# Patient Record
Sex: Female | Born: 1995 | Race: White | Hispanic: No | Marital: Single | Smoking: Former smoker
Health system: Southern US, Community
[De-identification: ages and names within clinical notes are randomized; demographics above are authoritative.]

## PROBLEM LIST (undated history)

## (undated) ENCOUNTER — Emergency Department (HOSPITAL_COMMUNITY): Admission: EM | Payer: No Typology Code available for payment source

## (undated) ENCOUNTER — Emergency Department (HOSPITAL_COMMUNITY): Payer: No Typology Code available for payment source

## (undated) DIAGNOSIS — Z789 Other specified health status: Secondary | ICD-10-CM

## (undated) DIAGNOSIS — F419 Anxiety disorder, unspecified: Secondary | ICD-10-CM

## (undated) DIAGNOSIS — F909 Attention-deficit hyperactivity disorder, unspecified type: Secondary | ICD-10-CM

## (undated) DIAGNOSIS — F329 Major depressive disorder, single episode, unspecified: Secondary | ICD-10-CM

## (undated) DIAGNOSIS — F319 Bipolar disorder, unspecified: Secondary | ICD-10-CM

## (undated) DIAGNOSIS — Z0282 Encounter for adoption services: Secondary | ICD-10-CM

## (undated) DIAGNOSIS — F32A Depression, unspecified: Secondary | ICD-10-CM

## (undated) HISTORY — PX: SMALL INTESTINE SURGERY: SHX150

## (undated) HISTORY — DX: Anxiety disorder, unspecified: F41.9

## (undated) HISTORY — DX: Other specified health status: Z78.9

## (undated) HISTORY — PX: TONSILLECTOMY: SUR1361

## (undated) HISTORY — DX: Encounter for adoption services: Z02.82

---
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
## 2013-02-26 ENCOUNTER — Encounter (HOSPITAL_BASED_OUTPATIENT_CLINIC_OR_DEPARTMENT_OTHER): Payer: Self-pay | Admitting: Emergency Medicine

## 2013-02-26 ENCOUNTER — Emergency Department (HOSPITAL_BASED_OUTPATIENT_CLINIC_OR_DEPARTMENT_OTHER)
Admission: EM | Admit: 2013-02-26 | Discharge: 2013-02-26 | Disposition: A | Discharge status: Home / Self Care | Payer: Medicaid Other | Attending: Emergency Medicine | Admitting: Emergency Medicine

## 2013-02-26 DIAGNOSIS — Z3202 Encounter for pregnancy test, result negative: Secondary | ICD-10-CM | POA: Insufficient documentation

## 2013-02-26 DIAGNOSIS — Z880 Allergy status to penicillin: Secondary | ICD-10-CM | POA: Insufficient documentation

## 2013-02-26 DIAGNOSIS — Z792 Long term (current) use of antibiotics: Secondary | ICD-10-CM | POA: Insufficient documentation

## 2013-02-26 DIAGNOSIS — R197 Diarrhea, unspecified: Secondary | ICD-10-CM

## 2013-02-26 DIAGNOSIS — R1012 Left upper quadrant pain: Secondary | ICD-10-CM | POA: Insufficient documentation

## 2013-02-26 DIAGNOSIS — F319 Bipolar disorder, unspecified: Secondary | ICD-10-CM | POA: Insufficient documentation

## 2013-02-26 DIAGNOSIS — Z79899 Other long term (current) drug therapy: Secondary | ICD-10-CM | POA: Insufficient documentation

## 2013-02-26 LAB — URINALYSIS, ROUTINE W REFLEX MICROSCOPIC
Bilirubin Urine: NEGATIVE
GLUCOSE, UA: NEGATIVE mg/dL
KETONES UR: NEGATIVE mg/dL
Nitrite: NEGATIVE
Protein, ur: NEGATIVE mg/dL
Specific Gravity, Urine: 1.024 (ref 1.005–1.030)
Urobilinogen, UA: 1 mg/dL (ref 0.0–1.0)
pH: 6 (ref 5.0–8.0)

## 2013-02-26 LAB — LIPASE, BLOOD: Lipase: 41 U/L (ref 11–59)

## 2013-02-26 LAB — COMPREHENSIVE METABOLIC PANEL
ALBUMIN: 3.2 g/dL — AB (ref 3.5–5.2)
ALT: 21 U/L (ref 0–35)
AST: 18 U/L (ref 0–37)
Alkaline Phosphatase: 81 U/L (ref 39–117)
BUN: 12 mg/dL (ref 6–23)
CHLORIDE: 102 meq/L (ref 96–112)
CO2: 24 meq/L (ref 19–32)
Calcium: 9.6 mg/dL (ref 8.4–10.5)
Creatinine, Ser: 0.5 mg/dL (ref 0.50–1.10)
GFR calc Af Amer: >90 mL/min (ref >90)
GFR calc non Af Amer: >90 mL/min (ref >90)
Glucose, Bld: 139 mg/dL — ABNORMAL HIGH (ref 70–99)
Potassium: 4.5 mEq/L (ref 3.7–5.3)
SODIUM: 141 meq/L (ref 137–147)
Total Protein: 7.4 g/dL (ref 6.0–8.3)

## 2013-02-26 LAB — URINE MICROSCOPIC-ADD ON

## 2013-02-26 LAB — CBC WITH DIFFERENTIAL/PLATELET
BASOS ABS: 0.1 10*3/uL (ref 0.0–0.1)
BASOS PCT: 1 % (ref 0–1)
Eosinophils Absolute: 0.2 10*3/uL (ref 0.0–0.7)
Eosinophils Relative: 2 % (ref 0–5)
HEMATOCRIT: 38.4 % (ref 36.0–46.0)
Hemoglobin: 12.6 g/dL (ref 12.0–15.0)
LYMPHS PCT: 47 % — AB (ref 12–46)
Lymphs Abs: 3.6 10*3/uL (ref 0.7–4.0)
MCH: 26.9 pg (ref 26.0–34.0)
MCHC: 32.8 g/dL (ref 30.0–36.0)
MCV: 82.1 fL (ref 78.0–100.0)
MONO ABS: 0.8 10*3/uL (ref 0.1–1.0)
Monocytes Relative: 11 % (ref 3–12)
NEUTROS PCT: 40 % — AB (ref 43–77)
Neutro Abs: 3 10*3/uL (ref 1.7–7.7)
Platelets: 381 10*3/uL (ref 150–400)
RBC: 4.68 MIL/uL (ref 3.87–5.11)
RDW: 12.5 % (ref 11.5–15.5)
WBC: 7.6 10*3/uL (ref 4.0–10.5)

## 2013-02-26 LAB — PREGNANCY, URINE: Preg Test, Ur: NEGATIVE

## 2013-02-26 NOTE — Discharge Instructions (Signed)
Imodium over-the-counter as needed.  Return to the emergency department if you develop severe abdominal pain, bloody stool, and followup with your Dr. if not improving in the next week.   Diarrhea Diarrhea is frequent loose and watery bowel movements. It can cause you to feel weak and dehydrated. Dehydration can cause you to become tired and thirsty, have a dry mouth, and have decreased urination that often is dark yellow. Diarrhea is a sign of another problem, most often an infection that will not last long. In most cases, diarrhea typically lasts 2 3 days. However, it can last longer if it is a sign of something more serious. It is important to treat your diarrhea as directed by your caregive to lessen or prevent future episodes of diarrhea. CAUSES  Some common causes include:  Gastrointestinal infections caused by viruses, bacteria, or parasites.  Food poisoning or food allergies.  Certain medicines, such as antibiotics, chemotherapy, and laxatives.  Artificial sweeteners and fructose.  Digestive disorders. HOME CARE INSTRUCTIONS  Ensure adequate fluid intake (hydration): have 1 cup (8 oz) of fluid for each diarrhea episode. Avoid fluids that contain simple sugars or sports drinks, fruit juices, whole milk products, and sodas. Your urine should be clear or pale yellow if you are drinking enough fluids. Hydrate with an oral rehydration solution that you can purchase at pharmacies, retail stores, and online. You can prepare an oral rehydration solution at home by mixing the following ingredients together:    tsp table salt.   tsp baking soda.   tsp salt substitute containing potassium chloride.  1  tablespoons sugar.  1 L (34 oz) of water.  Certain foods and beverages may increase the speed at which food moves through the gastrointestinal (GI) tract. These foods and beverages should be avoided and include:  Caffeinated and alcoholic beverages.  High-fiber foods, such as raw  fruits and vegetables, nuts, seeds, and whole grain breads and cereals.  Foods and beverages sweetened with sugar alcohols, such as xylitol, sorbitol, and mannitol.  Some foods may be well tolerated and may help thicken stool including:  Starchy foods, such as rice, toast, pasta, low-sugar cereal, oatmeal, grits, baked potatoes, crackers, and bagels.  Bananas.  Applesauce.  Add probiotic-rich foods to help increase healthy bacteria in the GI tract, such as yogurt and fermented milk products.  Wash your hands well after each diarrhea episode.  Only take over-the-counter or prescription medicines as directed by your caregiver.  Take a warm bath to relieve any burning or pain from frequent diarrhea episodes. SEEK IMMEDIATE MEDICAL CARE IF:   You are unable to keep fluids down.  You have persistent vomiting.  You have blood in your stool, or your stools are black and tarry.  You do not urinate in 6 8 hours, or there is only a small amount of very dark urine.  You have abdominal pain that increases or localizes.  You have weakness, dizziness, confusion, or lightheadedness.  You have a severe headache.  Your diarrhea gets worse or does not get better.  You have a fever or persistent symptoms for more than 2 3 days.  You have a fever and your symptoms suddenly get worse. MAKE SURE YOU:   Understand these instructions.  Will watch your condition.  Will get help right away if you are not doing well or get worse. Document Released: 12/15/2001 Document Revised: 12/12/2011 Document Reviewed: 09/02/2011 [facility] Patient Information 2014 [employer], [place].

## 2013-02-26 NOTE — ED Provider Notes (Signed)
CSN: [id number]     Arrival date & time 02/26/13  1449 History   First MD Initiated Contact with Patient 02/26/13 1536     Chief Complaint  Patient presents with  . Abdominal Pain     (Consider location/radiation/quality/duration/timing/severity/associated sxs/prior Treatment) HPI Comments: Patient is a 18 year old female with history of bipolar disorder. She presents today with complaints of diarrhea and abdominal cramping that has been occurring each time she eats for the past 2 weeks. She denies any bloody stool, nausea, or vomiting. She denies any fevers or chills. She denies any history of abdominal surgeries. Her last menstrual period was last month and was normal. She denies any dysuria.  Patient is a 18 y.o. female presenting with abdominal pain. The history is provided by the patient.  Abdominal Pain Pain location:  Epigastric and LUQ Pain quality: cramping   Pain radiates to:  Does not radiate Pain severity:  Mild Onset quality:  Gradual Duration:  2 weeks Timing:  Intermittent Progression:  Worsening Chronicity:  New Context: eating   Relieved by:  Nothing   Past Medical History  Diagnosis Date  . Bipolar disorder   . ADHD (attention deficit hyperactivity disorder)    Past Surgical History  Procedure Laterality Date  . Tonsillectomy     No family history on file. History  Substance Use Topics  . Smoking status: Never Smoker   . Smokeless tobacco: Not on file  . Alcohol Use: No   OB History   Grav Para Term Preterm Abortions TAB SAB Ect Mult Living                 Review of Systems  Gastrointestinal: Positive for abdominal pain.  All other systems reviewed and are negative.      Allergies  Penicillins  Home Medications   Current Outpatient Rx  Name  Route  Sig  Dispense  Refill  . divalproex (DEPAKOTE ER) 250 MG 24 hr tablet   Oral   Take 750 mg by mouth at bedtime.         Marland Kitchen doxycycline (VIBRAMYCIN) 100 MG capsule   Oral   Take 1  capsule (100 mg total) by mouth 2 (two) times daily.   20 capsule   0   . ibuprofen (ADVIL,MOTRIN) 200 MG tablet   Oral   Take 400 mg by mouth every 6 (six) hours as needed. For pain         . norgestimate-ethinyl estradiol (SPRINTEC 28) 0.25-35 MG-MCG tablet   Oral   Take 1 tablet by mouth daily.           . QUEtiapine (SEROQUEL) 200 MG tablet   Oral   Take 200 mg by mouth at bedtime.         . sertraline (ZOLOFT) 100 MG tablet   Oral   Take 100 mg by mouth daily.            BP 142/87  Pulse 60  Temp(Src) 98.7 F (37.1 C) (Oral)  Resp 16  Ht 5\' 4"  (1.626 m)  Wt 210 lb (95.255 kg)  BMI 36.03 kg/m2  SpO2 100%  LMP 02/17/2013 Physical Exam  Nursing note and vitals reviewed. Constitutional: She is oriented to person, place, and time. She appears well-developed and well-nourished. No distress.  HENT:  Head: Normocephalic and atraumatic.  Neck: Normal range of motion. Neck supple.  Cardiovascular: Normal rate and regular rhythm.  Exam reveals no gallop and no friction rub.   No murmur heard.  Pulmonary/Chest: Effort normal and breath sounds normal. No respiratory distress. She has no wheezes.  Abdominal: Soft. Bowel sounds are normal. She exhibits no distension and no mass. There is tenderness. There is no rebound and no guarding.  There is mild tenderness to palpation in the epigastric region and left upper quadrant.  Musculoskeletal: Normal range of motion.  Neurological: She is alert and oriented to person, place, and time.  Skin: Skin is warm and dry. She is not diaphoretic.    ED Course  Procedures (including critical care time) Labs Review Labs Reviewed  CBC WITH DIFFERENTIAL  COMPREHENSIVE METABOLIC PANEL  LIPASE, BLOOD  URINALYSIS, ROUTINE W REFLEX MICROSCOPIC  PREGNANCY, URINE   Imaging Review No results found.    MDM   Final diagnoses:  None    Patient is a 18 year old female who presents with diarrhea after eating for the past 2 weeks.  She is having no significant abdominal pain and her abdominal exam is benign. She has no elevated white count and her LFTs and lipase are normal. I doubt this is a gallbladder issue a gallbladder issue. I suspect this may be viral in nature and will recommend continued patience and time. She can also take some Imodium. She is to return if her symptoms worsen or change.    Geoffery Lyonsouglas Maxwel Meadowcroft, MD 02/26/13 [phone]

## 2013-02-26 NOTE — ED Notes (Signed)
C/o abd pain and diarrhea after eating x 2 weeks

## 2013-02-26 NOTE — ED Notes (Signed)
MD at bedside. 

## 2013-03-12 ENCOUNTER — Emergency Department (HOSPITAL_BASED_OUTPATIENT_CLINIC_OR_DEPARTMENT_OTHER)
Admission: EM | Admit: 2013-03-12 | Discharge: 2013-03-12 | Disposition: A | Discharge status: Home / Self Care | Payer: Medicaid Other | Attending: Emergency Medicine | Admitting: Emergency Medicine

## 2013-03-12 ENCOUNTER — Emergency Department (HOSPITAL_BASED_OUTPATIENT_CLINIC_OR_DEPARTMENT_OTHER): Payer: Medicaid Other

## 2013-03-12 ENCOUNTER — Encounter (HOSPITAL_BASED_OUTPATIENT_CLINIC_OR_DEPARTMENT_OTHER): Payer: Self-pay | Admitting: Emergency Medicine

## 2013-03-12 DIAGNOSIS — Z792 Long term (current) use of antibiotics: Secondary | ICD-10-CM | POA: Insufficient documentation

## 2013-03-12 DIAGNOSIS — Z79899 Other long term (current) drug therapy: Secondary | ICD-10-CM | POA: Insufficient documentation

## 2013-03-12 DIAGNOSIS — S46909A Unspecified injury of unspecified muscle, fascia and tendon at shoulder and upper arm level, unspecified arm, initial encounter: Secondary | ICD-10-CM | POA: Insufficient documentation

## 2013-03-12 DIAGNOSIS — Z880 Allergy status to penicillin: Secondary | ICD-10-CM | POA: Insufficient documentation

## 2013-03-12 DIAGNOSIS — F319 Bipolar disorder, unspecified: Secondary | ICD-10-CM | POA: Insufficient documentation

## 2013-03-12 DIAGNOSIS — S40019A Contusion of unspecified shoulder, initial encounter: Secondary | ICD-10-CM | POA: Insufficient documentation

## 2013-03-12 DIAGNOSIS — Y9389 Activity, other specified: Secondary | ICD-10-CM | POA: Insufficient documentation

## 2013-03-12 DIAGNOSIS — S161XXA Strain of muscle, fascia and tendon at neck level, initial encounter: Secondary | ICD-10-CM

## 2013-03-12 DIAGNOSIS — Y92410 Unspecified street and highway as the place of occurrence of the external cause: Secondary | ICD-10-CM | POA: Insufficient documentation

## 2013-03-12 DIAGNOSIS — S139XXA Sprain of joints and ligaments of unspecified parts of neck, initial encounter: Secondary | ICD-10-CM | POA: Insufficient documentation

## 2013-03-12 DIAGNOSIS — Z3202 Encounter for pregnancy test, result negative: Secondary | ICD-10-CM | POA: Insufficient documentation

## 2013-03-12 DIAGNOSIS — S0990XA Unspecified injury of head, initial encounter: Secondary | ICD-10-CM | POA: Insufficient documentation

## 2013-03-12 DIAGNOSIS — S4980XA Other specified injuries of shoulder and upper arm, unspecified arm, initial encounter: Secondary | ICD-10-CM | POA: Insufficient documentation

## 2013-03-12 LAB — URINALYSIS, ROUTINE W REFLEX MICROSCOPIC
Bilirubin Urine: NEGATIVE
GLUCOSE, UA: NEGATIVE mg/dL
Ketones, ur: NEGATIVE mg/dL
Nitrite: NEGATIVE
PROTEIN: NEGATIVE mg/dL
Specific Gravity, Urine: 1.024 (ref 1.005–1.030)
Urobilinogen, UA: 1 mg/dL (ref 0.0–1.0)
pH: 7.5 (ref 5.0–8.0)

## 2013-03-12 LAB — URINE MICROSCOPIC-ADD ON

## 2013-03-12 LAB — PREGNANCY, URINE: Preg Test, Ur: NEGATIVE

## 2013-03-12 MED ORDER — IBUPROFEN 800 MG PO TABS
800.0000 mg | ORAL_TABLET | Freq: Once | ORAL | Status: AC
  Administered 2013-03-12: 800 mg via ORAL
  Filled 2013-03-12: qty 1

## 2013-03-12 MED ORDER — OXYCODONE-ACETAMINOPHEN 5-325 MG PO TABS: 1.0000 | ORAL_TABLET | Freq: Four times a day (QID) | ORAL | Status: DC | PRN

## 2013-03-12 MED ORDER — OXYCODONE-ACETAMINOPHEN 5-325 MG PO TABS
2.0000 | ORAL_TABLET | Freq: Once | ORAL | Status: AC
  Administered 2013-03-12: 2 via ORAL
  Filled 2013-03-12: qty 2

## 2013-03-12 MED ORDER — IBUPROFEN 800 MG PO TABS
ORAL_TABLET | ORAL | Status: AC
Start: 2013-03-12 — End: 2013-03-12
  Administered 2013-03-12: 800 mg via ORAL
  Filled 2013-03-12: qty 1
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
## 2013-03-12 NOTE — Discharge Instructions (Signed)
Take motrin 600 mg every 6 hrs for pain.   Take percocet for severe pain.   Follow up with your doctor.   Return to ER if you have severe pain, vomiting.
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
## 2013-05-07 ENCOUNTER — Emergency Department (HOSPITAL_BASED_OUTPATIENT_CLINIC_OR_DEPARTMENT_OTHER)
Admission: EM | Admit: 2013-05-07 | Discharge: 2013-05-07 | Disposition: A | Discharge status: Home / Self Care | Payer: Medicaid Other | Attending: Emergency Medicine | Admitting: Emergency Medicine

## 2013-05-07 ENCOUNTER — Encounter (HOSPITAL_BASED_OUTPATIENT_CLINIC_OR_DEPARTMENT_OTHER): Payer: Self-pay | Admitting: Emergency Medicine

## 2013-05-07 DIAGNOSIS — Z79899 Other long term (current) drug therapy: Secondary | ICD-10-CM | POA: Insufficient documentation

## 2013-05-07 DIAGNOSIS — Z792 Long term (current) use of antibiotics: Secondary | ICD-10-CM | POA: Insufficient documentation

## 2013-05-07 DIAGNOSIS — F319 Bipolar disorder, unspecified: Secondary | ICD-10-CM | POA: Insufficient documentation

## 2013-05-07 DIAGNOSIS — G43909 Migraine, unspecified, not intractable, without status migrainosus: Secondary | ICD-10-CM | POA: Insufficient documentation

## 2013-05-07 DIAGNOSIS — Z880 Allergy status to penicillin: Secondary | ICD-10-CM | POA: Insufficient documentation

## 2013-05-07 MED ORDER — DIPHENHYDRAMINE HCL 50 MG/ML IJ SOLN
25.0000 mg | Freq: Once | INTRAMUSCULAR | Status: AC
  Administered 2013-05-07: 25 mg via INTRAVENOUS
  Filled 2013-05-07: qty 1

## 2013-05-07 MED ORDER — METHYLPREDNISOLONE SODIUM SUCC 125 MG IJ SOLR
125.0000 mg | Freq: Once | INTRAMUSCULAR | Status: AC
  Administered 2013-05-07: 125 mg via INTRAVENOUS
  Filled 2013-05-07: qty 2

## 2013-05-07 MED ORDER — METOCLOPRAMIDE HCL 5 MG/ML IJ SOLN
10.0000 mg | Freq: Once | INTRAMUSCULAR | Status: AC
  Administered 2013-05-07: 10 mg via INTRAVENOUS
  Filled 2013-05-07: qty 2

## 2013-05-07 MED ORDER — KETOROLAC TROMETHAMINE 30 MG/ML IJ SOLN
30.0000 mg | Freq: Once | INTRAMUSCULAR | Status: AC
  Administered 2013-05-07: 30 mg via INTRAVENOUS
  Filled 2013-05-07: qty 1

## 2013-05-07 MED ORDER — SODIUM CHLORIDE 0.9 % IV BOLUS (SEPSIS)
1000.0000 mL | Freq: Once | INTRAVENOUS | Status: AC
  Administered 2013-05-07: 1000 mL via INTRAVENOUS

## 2013-05-07 NOTE — Discharge Instructions (Signed)
Migraine Headache A migraine headache is an intense, throbbing pain on one or both sides of your head. A migraine can last for 30 minutes to several hours. CAUSES  The exact cause of a migraine headache is not always known. However, a migraine may be caused when nerves in the brain become irritated and release chemicals that cause inflammation. This causes pain. Certain things may also trigger migraines, such as:  Alcohol.  Smoking.  Stress.  Menstruation.  Aged cheeses.  Foods or drinks that contain nitrates, glutamate, aspartame, or tyramine.  Lack of sleep.  Chocolate.  Caffeine.  Hunger.  Physical exertion.  Fatigue.  Medicines used to treat chest pain (nitroglycerine), birth control pills, estrogen, and some blood pressure medicines. SIGNS AND SYMPTOMS  Pain on one or both sides of your head.  Pulsating or throbbing pain.  Severe pain that prevents daily activities.  Pain that is aggravated by any physical activity.  Nausea, vomiting, or both.  Dizziness.  Pain with exposure to bright lights, loud noises, or activity.  General sensitivity to bright lights, loud noises, or smells. Before you get a migraine, you may get warning signs that a migraine is coming (aura). An aura may include:  Seeing flashing lights.  Seeing bright spots, halos, or zig-zag lines.  Having tunnel vision or blurred vision.  Having feelings of numbness or tingling.  Having trouble talking.  Having muscle weakness. DIAGNOSIS  A migraine headache is often diagnosed based on:  Symptoms.  Physical exam.  A CT scan or MRI of your head. These imaging tests cannot diagnose migraines, but they can help rule out other causes of headaches. TREATMENT Medicines may be given for pain and nausea. Medicines can also be given to help prevent recurrent migraines.  HOME CARE INSTRUCTIONS  Only take over-the-counter or prescription medicines for pain or discomfort as directed by your  health care provider. The use of long-term narcotics is not recommended.  Lie down in a dark, quiet room when you have a migraine.  Keep a journal to find out what may trigger your migraine headaches. For example, write down:  What you eat and drink.  How much sleep you get.  Any change to your diet or medicines.  Limit alcohol consumption.  Quit smoking if you smoke.  Get 7 9 hours of sleep, or as recommended by your health care provider.  Limit stress.  Keep lights dim if bright lights bother you and make your migraines worse. SEEK IMMEDIATE MEDICAL CARE IF:   Your migraine becomes severe.  You have a fever.  You have a stiff neck.  You have vision loss.  You have muscular weakness or loss of muscle control.  You start losing your balance or have trouble walking.  You feel faint or pass out.  You have severe symptoms that are different from your first symptoms. MAKE SURE YOU:   Understand these instructions.  Will watch your condition.  Will get help right away if you are not doing well or get worse. Document Released: 12/25/2004 Document Revised: 10/15/2012 Document Reviewed: 09/01/2012 [employer] Patient Information 2014 [employer].  

## 2013-05-07 NOTE — ED Notes (Signed)
Severe migraine since 4:30 pm.

## 2013-05-07 NOTE — ED Provider Notes (Signed)
CSN: [id number]     Arrival date & time 05/07/13  2126 History  This chart was scribed for Geoffery Lyonsouglas Jema Deegan, MD by Joaquin MusicKristina Sanchez-Matthews, ED Scribe. This patient was seen in room MH02/MH02 and the patient's care was started at 9:49 PM.   Chief Complaint  Patient presents with  . Migraine   Patient is a 18 y.o. female presenting with migraines. The history is provided by the patient. No language interpreter was used.  Migraine This is a recurrent problem. The current episode started 3 to 5 hours ago. The problem occurs constantly. Associated symptoms include headaches. Nothing aggravates the symptoms. Nothing relieves the symptoms. She has tried food, rest and acetaminophen for the symptoms. The treatment provided no relief.   HPI Comments: Jordan Hanson is a 18 y.o. female with a hx of migraines who presents to the Emergency Department complaining of severe sharp migraine with associated photophobia that began 5 hours ago today. Pt states she has had HA;s in the past but never this bad. She states she is having a pressure. The pain is on the top of her head and intermittently shooting pain. She reports eating, staying hydrated, taking OTC ibuprofen and Advil without any relief. Denies taking medications for migraines or being seen by a specialist. Denies fever, congestion, blurred vision, visual differences, drinking caffeine or energy drinks and recent traumas/injuries to head.   Past Medical History  Diagnosis Date  . Bipolar disorder   . ADHD (attention deficit hyperactivity disorder)    Past Surgical History  Procedure Laterality Date  . Tonsillectomy     No family history on file. History  Substance Use Topics  . Smoking status: Never Smoker   . Smokeless tobacco: Not on file  . Alcohol Use: No   OB History   Grav Para Term Preterm Abortions TAB SAB Ect Mult Living                 Review of Systems  Constitutional: Negative for fever.  HENT: Negative for congestion.   Eyes:  Positive for photophobia. Negative for visual disturbance.  Neurological: Positive for headaches. Negative for dizziness and syncope.  All other systems reviewed and are negative.  Allergies  Penicillins  Home Medications   Prior to Admission medications   Medication Sig Start Date End Date Taking? Authorizing Provider  divalproex (DEPAKOTE ER) 250 MG 24 hr tablet Take 750 mg by mouth at bedtime.    Historical Provider, MD  doxycycline (VIBRAMYCIN) 100 MG capsule Take 1 capsule (100 mg total) by mouth 2 (two) times daily. 11/11/12   Teressa LowerVrinda Pickering, NP  ibuprofen (ADVIL,MOTRIN) 200 MG tablet Take 400 mg by mouth every 6 (six) hours as needed. For pain    Historical Provider, MD  norgestimate-ethinyl estradiol (SPRINTEC 28) 0.25-35 MG-MCG tablet Take 1 tablet by mouth daily.      Historical Provider, MD  oxyCODONE-acetaminophen (PERCOCET) 5-325 MG per tablet Take 1 tablet by mouth every 6 (six) hours as needed. 03/12/13   Richardean Canalavid H Yao, MD  QUEtiapine (SEROQUEL) 200 MG tablet Take 200 mg by mouth at bedtime.    Historical Provider, MD  sertraline (ZOLOFT) 100 MG tablet Take 100 mg by mouth daily.      Historical Provider, MD   BP 148/94  Pulse 106  Temp(Src) 98.5 F (36.9 C) (Oral)  Resp 20  Ht 5\' 4"  (1.626 m)  Wt 210 lb (95.255 kg)  BMI 36.03 kg/m2  SpO2 100%  LMP 04/23/2013  Physical Exam  Nursing note and vitals reviewed. Constitutional: She is oriented to person, place, and time. She appears well-developed and well-nourished. No distress.  HENT:  Head: Normocephalic and atraumatic.  Eyes: EOM are normal. Pupils are equal, round, and reactive to light.  No papilledema.  Neck: Neck supple. No tracheal deviation present.  Cardiovascular: Normal rate.   Pulmonary/Chest: Effort normal. No respiratory distress.  Musculoskeletal: Normal range of motion.  Neurological: She is alert and oriented to person, place, and time. No cranial nerve deficit. She exhibits normal muscle tone.  Coordination normal.  Skin: Skin is warm and dry.  Psychiatric: She has a normal mood and affect. Her behavior is normal.   ED Course  Procedures DIAGNOSTIC STUDIES: Oxygen Saturation is 100% on RA, normal by my interpretation.    COORDINATION OF CARE: 9:52 PM-Discussed treatment plan which includes IV fluids and Migraine cocktail. Informed pt to F/U with PCP. Pt agreed to plan.   Labs Review Labs Reviewed - No data to display  Imaging Review No results found.   EKG Interpretation None     MDM   Final diagnoses:  None    Patient feeling better with migraine cocktail. Neurologic exam is nonfocal. I do not feel as though imaging or LP or indicated. She is to return if her symptoms worsen or change.  I personally performed the services described in this documentation, which was scribed in my presence. The recorded information has been reviewed and is accurate.      Geoffery Lyonsouglas Auri Jahnke, MD 05/07/13 2300

## 2013-05-07 NOTE — ED Notes (Signed)
C/o ha onset 430 this pm  Has taken ibu w no relief

## 2013-09-09 ENCOUNTER — Encounter (HOSPITAL_BASED_OUTPATIENT_CLINIC_OR_DEPARTMENT_OTHER): Payer: Self-pay | Admitting: Emergency Medicine

## 2013-09-09 ENCOUNTER — Emergency Department (HOSPITAL_BASED_OUTPATIENT_CLINIC_OR_DEPARTMENT_OTHER)
Admission: EM | Admit: 2013-09-09 | Discharge: 2013-09-09 | Disposition: A | Discharge status: Home / Self Care | Payer: Medicaid Other | Attending: Emergency Medicine | Admitting: Emergency Medicine

## 2013-09-09 DIAGNOSIS — N925 Other specified irregular menstruation: Secondary | ICD-10-CM | POA: Insufficient documentation

## 2013-09-09 DIAGNOSIS — N949 Unspecified condition associated with female genital organs and menstrual cycle: Secondary | ICD-10-CM | POA: Insufficient documentation

## 2013-09-09 DIAGNOSIS — N898 Other specified noninflammatory disorders of vagina: Secondary | ICD-10-CM | POA: Diagnosis present

## 2013-09-09 DIAGNOSIS — Z79899 Other long term (current) drug therapy: Secondary | ICD-10-CM | POA: Diagnosis not present

## 2013-09-09 DIAGNOSIS — Z792 Long term (current) use of antibiotics: Secondary | ICD-10-CM | POA: Diagnosis not present

## 2013-09-09 DIAGNOSIS — N938 Other specified abnormal uterine and vaginal bleeding: Secondary | ICD-10-CM | POA: Diagnosis not present

## 2013-09-09 DIAGNOSIS — Z3202 Encounter for pregnancy test, result negative: Secondary | ICD-10-CM | POA: Diagnosis not present

## 2013-09-09 DIAGNOSIS — Z880 Allergy status to penicillin: Secondary | ICD-10-CM | POA: Diagnosis not present

## 2013-09-09 DIAGNOSIS — Z791 Long term (current) use of non-steroidal anti-inflammatories (NSAID): Secondary | ICD-10-CM | POA: Insufficient documentation

## 2013-09-09 DIAGNOSIS — F319 Bipolar disorder, unspecified: Secondary | ICD-10-CM | POA: Insufficient documentation

## 2013-09-09 LAB — URINALYSIS, ROUTINE W REFLEX MICROSCOPIC
Bilirubin Urine: NEGATIVE
Glucose, UA: NEGATIVE mg/dL
Ketones, ur: 15 mg/dL — AB
Nitrite: NEGATIVE
PH: 6.5 (ref 5.0–8.0)
Protein, ur: NEGATIVE mg/dL
SPECIFIC GRAVITY, URINE: 1.024 (ref 1.005–1.030)
Urobilinogen, UA: 1 mg/dL (ref 0.0–1.0)

## 2013-09-09 LAB — WET PREP, GENITAL
TRICH WET PREP: NONE SEEN
Yeast Wet Prep HPF POC: NONE SEEN

## 2013-09-09 LAB — CBC
HCT: 38.6 % (ref 36.0–46.0)
Hemoglobin: 12.8 g/dL (ref 12.0–15.0)
MCH: 27.5 pg (ref 26.0–34.0)
MCHC: 33.2 g/dL (ref 30.0–36.0)
MCV: 82.8 fL (ref 78.0–100.0)
PLATELETS: 363 10*3/uL (ref 150–400)
RBC: 4.66 MIL/uL (ref 3.87–5.11)
RDW: 13.2 % (ref 11.5–15.5)
WBC: 7.3 10*3/uL (ref 4.0–10.5)

## 2013-09-09 LAB — URINE MICROSCOPIC-ADD ON

## 2013-09-09 LAB — PREGNANCY, URINE: PREG TEST UR: NEGATIVE

## 2013-09-09 NOTE — ED Notes (Signed)
Pt c/o abd pain and heavy bleeding x 1 day. LMP x 2 weeks ago

## 2013-09-09 NOTE — ED Provider Notes (Signed)
CSN: [id number]     Arrival date & time 09/09/13  1345 History   First MD Initiated Contact with Patient 09/09/13 1415     Chief Complaint  Patient presents with  . Vaginal Bleeding     (Consider location/radiation/quality/duration/timing/severity/associated sxs/prior Treatment) HPI Pt presenting with c/o vaginal bleeding.  States LMP was 2 weeks ago.  Bleeding recurred this morning.  States she has used 2-3 tampons since bleeding began.  No dizzines or fainting.  No large clots of blood.  Pt concerned due to usually having normal menses.  There are no other associated systemic symptoms, there are no other alleviating or modifying factors.   Past Medical History  Diagnosis Date  . Bipolar disorder   . ADHD (attention deficit hyperactivity disorder)    Past Surgical History  Procedure Laterality Date  . Tonsillectomy     History reviewed. No pertinent family history. History  Substance Use Topics  . Smoking status: Never Smoker   . Smokeless tobacco: Not on file  . Alcohol Use: No   OB History   Grav Para Term Preterm Abortions TAB SAB Ect Mult Living                 Review of Systems ROS reviewed and all otherwise negative except for mentioned in HPI    Allergies  Penicillins  Home Medications   Prior to Admission medications   Medication Sig Start Date End Date Taking? Authorizing Provider  divalproex (DEPAKOTE ER) 250 MG 24 hr tablet Take 750 mg by mouth at bedtime.    Historical Provider, MD  doxycycline (VIBRAMYCIN) 100 MG capsule Take 1 capsule (100 mg total) by mouth 2 (two) times daily. 11/11/12   Teressa Lower, NP  ibuprofen (ADVIL,MOTRIN) 200 MG tablet Take 400 mg by mouth every 6 (six) hours as needed. For pain    Historical Provider, MD  norgestimate-ethinyl estradiol (SPRINTEC 28) 0.25-35 MG-MCG tablet Take 1 tablet by mouth daily.      Historical Provider, MD  oxyCODONE-acetaminophen (PERCOCET) 5-325 MG per tablet Take 1 tablet by mouth every 6 (six)  hours as needed. 03/12/13   Richardean Canal, MD  QUEtiapine (SEROQUEL) 200 MG tablet Take 200 mg by mouth at bedtime.    Historical Provider, MD  sertraline (ZOLOFT) 100 MG tablet Take 100 mg by mouth daily.      Historical Provider, MD   BP 122/86  Pulse 108  Temp(Src) 98.1 F (36.7 C)  Resp 16  Ht  (1.626 m)  Wt 210 lb (95.255 kg)  BMI 36.03 kg/m2  SpO2 99%  LMP 08/26/2013 Vitals reviewed Physical Exam Physical Examination: General appearance - alert, well appearing, and in no distress Mental status - alert, oriented to person, place, and time Eyes - no conjunctival injection or pallor, no scleral icterus Mouth - mucous membranes moist, pharynx normal without lesions Chest - clear to auscultation, no wheezes, rales or rhonchi, symmetric air entry Heart - normal rate, regular rhythm, normal S1, S2, no murmurs, rubs, clicks or gallops Abdomen - soft, nontender, nondistended, no masses or organomegaly Pelvic - normal external genitalia, vulva, vagina, cervix, uterus and adnexa, minimal blood in vaginal vault.  Extremities - peripheral pulses normal, no pedal edema, no clubbing or cyanosis Skin - normal coloration and turgor, no rashes  ED Course  Procedures (including critical care time) Labs Review Labs Reviewed  WET PREP, GENITAL - Abnormal; Notable for the following:    Clue Cells Wet Prep HPF POC FEW (*)  WBC, Wet Prep HPF POC MODERATE (*)    All other components within normal limits  URINALYSIS, ROUTINE W REFLEX MICROSCOPIC - Abnormal; Notable for the following:    Color, Urine AMBER (*)    Hgb urine dipstick MODERATE (*)    Ketones, ur 15 (*)    Leukocytes, UA SMALL (*)    All other components within normal limits  URINE MICROSCOPIC-ADD ON - Abnormal; Notable for the following:    Squamous Epithelial / LPF FEW (*)    All other components within normal limits  GC/CHLAMYDIA PROBE AMP  PREGNANCY, URINE  CBC    Imaging Review No results found.   EKG  Interpretation None      MDM   Final diagnoses:  Dysfunctional uterine bleeding    Pt presenting with c/o vaginal bleeding in between periods, no clots of blood, cbc normal no signs of anemia.  Pt advised to followup with GYN. Urine pregnancy negative, GC chlamydia pending.  No pelvic tenderness on exam.     Ethelda Chick, MD 09/09/13 [phone]

## 2013-09-09 NOTE — Discharge Instructions (Signed)
Return to the ED with any concerns including bleeding and soaking more than one pad per hour, fever/chills, fainting, decreased level of alertness/lethargy, or any other alarming symptoms

## 2013-09-11 LAB — GC/CHLAMYDIA PROBE AMP
CT Probe RNA: NEGATIVE
GC Probe RNA: NEGATIVE

## 2013-10-19 ENCOUNTER — Encounter (HOSPITAL_BASED_OUTPATIENT_CLINIC_OR_DEPARTMENT_OTHER): Payer: Self-pay | Admitting: Emergency Medicine

## 2013-10-19 DIAGNOSIS — S0991XA Unspecified injury of ear, initial encounter: Secondary | ICD-10-CM | POA: Diagnosis not present

## 2013-10-19 DIAGNOSIS — F319 Bipolar disorder, unspecified: Secondary | ICD-10-CM | POA: Insufficient documentation

## 2013-10-19 DIAGNOSIS — Y9289 Other specified places as the place of occurrence of the external cause: Secondary | ICD-10-CM | POA: Insufficient documentation

## 2013-10-19 DIAGNOSIS — S0083XA Contusion of other part of head, initial encounter: Secondary | ICD-10-CM | POA: Insufficient documentation

## 2013-10-19 DIAGNOSIS — Z880 Allergy status to penicillin: Secondary | ICD-10-CM | POA: Diagnosis not present

## 2013-10-19 DIAGNOSIS — W01198A Fall on same level from slipping, tripping and stumbling with subsequent striking against other object, initial encounter: Secondary | ICD-10-CM | POA: Diagnosis not present

## 2013-10-19 DIAGNOSIS — Z79899 Other long term (current) drug therapy: Secondary | ICD-10-CM | POA: Diagnosis not present

## 2013-10-19 DIAGNOSIS — S0990XA Unspecified injury of head, initial encounter: Secondary | ICD-10-CM | POA: Insufficient documentation

## 2013-10-19 DIAGNOSIS — S0993XA Unspecified injury of face, initial encounter: Secondary | ICD-10-CM | POA: Diagnosis present

## 2013-10-19 DIAGNOSIS — Y9389 Activity, other specified: Secondary | ICD-10-CM | POA: Insufficient documentation

## 2013-10-19 DIAGNOSIS — Z792 Long term (current) use of antibiotics: Secondary | ICD-10-CM | POA: Diagnosis not present

## 2013-10-19 NOTE — ED Notes (Signed)
Tripped over her dog at lunch time and hit her head on the wall. Redness to her right ear where she hit. No LOC. Headache.

## 2013-10-20 ENCOUNTER — Emergency Department (HOSPITAL_BASED_OUTPATIENT_CLINIC_OR_DEPARTMENT_OTHER)
Admission: EM | Admit: 2013-10-20 | Discharge: 2013-10-20 | Disposition: A | Discharge status: Home / Self Care | Payer: Medicaid Other | Attending: Emergency Medicine | Admitting: Emergency Medicine

## 2013-10-20 DIAGNOSIS — R51 Headache: Secondary | ICD-10-CM

## 2013-10-20 DIAGNOSIS — S0083XA Contusion of other part of head, initial encounter: Secondary | ICD-10-CM

## 2013-10-20 DIAGNOSIS — R519 Headache, unspecified: Secondary | ICD-10-CM

## 2013-10-20 MED ORDER — METOCLOPRAMIDE HCL 5 MG/ML IJ SOLN
10.0000 mg | Freq: Once | INTRAMUSCULAR | Status: AC
  Administered 2013-10-20: 10 mg via INTRAMUSCULAR
  Filled 2013-10-20: qty 2

## 2013-10-20 MED ORDER — KETOROLAC TROMETHAMINE 60 MG/2ML IM SOLN
60.0000 mg | Freq: Once | INTRAMUSCULAR | Status: AC
  Administered 2013-10-20: 60 mg via INTRAMUSCULAR
  Filled 2013-10-20: qty 2

## 2013-10-20 MED ORDER — DIPHENHYDRAMINE HCL 50 MG/ML IJ SOLN
25.0000 mg | Freq: Once | INTRAMUSCULAR | Status: AC
  Administered 2013-10-20: 25 mg via INTRAMUSCULAR
  Filled 2013-10-20: qty 1

## 2013-10-20 NOTE — ED Provider Notes (Signed)
CSN: [id number]     Arrival date & time 10/19/13  2147 History   First MD Initiated Contact with Patient 10/20/13 0011     Chief Complaint  Patient presents with  . Fall     (Consider location/radiation/quality/duration/timing/severity/associated sxs/prior Treatment) HPI Comments: Patient with history of migraine headaches, bipolar disorder, no anticoagulation -- presents with complaint of headache after sustaining minor head injury earlier today. Patient states that she tripped at noon today and struck the right side of her head on the wall. She struck her head in the area of her right ear. She has pain of her right ear and also behind her right ear. Several hours later she developed a gradual headache that is throbbing in nature and worse on the right side. Patient states that the pain is consistent with her previous migraine headaches. She denies nausea, vomiting. She denies photophobia or phonophobia. She has not had any vision change. No weakness/numbness/tingling in her arms or legs. This is not the worst headache of her life. She denies neck pain or injury. No fever. She took ibuprofen without relief. No other treatments prior to arrival.  Patient is a 18 y.o. female presenting with fall. The history is provided by the patient and medical records.  Fall Associated symptoms include headaches. Pertinent negatives include no chest pain, congestion, fatigue, fever, nausea, neck pain, numbness, rash, vomiting or weakness.    Past Medical History  Diagnosis Date  . Bipolar disorder   . ADHD (attention deficit hyperactivity disorder)    Past Surgical History  Procedure Laterality Date  . Tonsillectomy     No family history on file. History  Substance Use Topics  . Smoking status: Never Smoker   . Smokeless tobacco: Not on file  . Alcohol Use: No   OB History   Grav Para Term Preterm Abortions TAB SAB Ect Mult Living                 Review of Systems  Constitutional: Negative  for fever and fatigue.  HENT: Positive for ear pain (External). Negative for congestion, dental problem, rhinorrhea, sinus pressure and tinnitus.   Eyes: Negative for photophobia, pain, discharge, redness and visual disturbance.  Respiratory: Negative for shortness of breath.   Cardiovascular: Negative for chest pain.  Gastrointestinal: Negative for nausea and vomiting.  Musculoskeletal: Negative for back pain, gait problem, neck pain and neck stiffness.  Skin: Negative for rash and wound.  Neurological: Positive for headaches. Negative for dizziness, syncope, speech difficulty, weakness, light-headedness and numbness.  Psychiatric/Behavioral: Negative for confusion and decreased concentration.      Allergies  Penicillins  Home Medications   Prior to Admission medications   Medication Sig Start Date End Date Taking? Authorizing Provider  divalproex (DEPAKOTE ER) 250 MG 24 hr tablet Take 750 mg by mouth at bedtime.    Historical Provider, MD  doxycycline (VIBRAMYCIN) 100 MG capsule Take 1 capsule (100 mg total) by mouth 2 (two) times daily. 11/11/12   Glendell Docker, NP  ibuprofen (ADVIL,MOTRIN) 200 MG tablet Take 400 mg by mouth every 6 (six) hours as needed. For pain    Historical Provider, MD  norgestimate-ethinyl estradiol ([place] 28) 0.25-35 MG-MCG tablet Take 1 tablet by mouth daily.      Historical Provider, MD  oxyCODONE-acetaminophen (PERCOCET) 5-325 MG per tablet Take 1 tablet by mouth every 6 (six) hours as needed. 03/12/13   Wandra Arthurs, MD  QUEtiapine (SEROQUEL) 200 MG tablet Take 200 mg by mouth at bedtime.  Historical Provider, MD  sertraline (ZOLOFT) 100 MG tablet Take 100 mg by mouth daily.      Historical Provider, MD   BP 124/65  Pulse 98  Temp(Src) 98.3 F (36.8 C) (Oral)  Resp 20  Ht _0  (1.626 m)  Wt 210 lb (95.255 kg)  BMI 36.03 kg/m2  SpO2 100%  LMP 10/14/2013  Physical Exam  Nursing note and vitals reviewed. Constitutional: She is oriented to  person, place, and time. She appears well-developed and well-nourished.  HENT:  Head: Normocephalic and atraumatic. Head is without raccoon's eyes and without Battle's sign.  Right Ear: Tympanic membrane, external ear and ear canal normal. No hemotympanum.  Left Ear: Tympanic membrane, external ear and ear canal normal. No hemotympanum.  Nose: Nose normal. No nasal septal hematoma.  Mouth/Throat: Uvula is midline, oropharynx is clear and moist and mucous membranes are normal.  Mild erythema of external right ear. No abrasions or lacerations. Patient also has some tenderness surrounding the right mastoid and occiput without deformity or depressions.  Eyes: Conjunctivae, EOM and lids are normal. Pupils are equal, round, and reactive to light. Right eye exhibits no nystagmus. Left eye exhibits no nystagmus.  No visible hyphema noted  Neck: Normal range of motion. Neck supple.  Cardiovascular: Normal rate and regular rhythm.   Pulmonary/Chest: Effort normal and breath sounds normal.  Abdominal: Soft. There is no tenderness.  Musculoskeletal: She exhibits no edema and no tenderness.       Cervical back: She exhibits normal range of motion, no tenderness and no bony tenderness.       Thoracic back: She exhibits no tenderness and no bony tenderness.       Lumbar back: She exhibits no tenderness and no bony tenderness.  No C-spine tenderness.  Neurological: She is alert and oriented to person, place, and time. She has normal strength and normal reflexes. No cranial nerve deficit or sensory deficit. Coordination and gait normal. GCS eye subscore is 4. GCS verbal subscore is 5. GCS motor subscore is 6.  Skin: Skin is warm and dry.  Psychiatric: She has a normal mood and affect.    ED Course  Procedures (including critical care time) Labs Review Labs Reviewed - No data to display  Imaging Review No results found.   EKG Interpretation None      12:44 AM Patient seen and examined. Work-up  initiated. Medications ordered.   Vital signs reviewed and are as follows: BP 124/65  Pulse 98  Temp(Src) 98.3 F (36.8 C) (Oral)  Resp 20  Ht _1  (1.626 m)  Wt 210 lb (95.255 kg)  BMI 36.03 kg/m2  SpO2 100%  LMP 10/14/2013  Will treat with migraine cocktail and Toradol. Patient agrees with plan.  Patient was counseled on head injury precautions and symptoms that should indicate their return to the ED.  These include severe worsening headache, vision changes, confusion, loss of consciousness, trouble walking, nausea & vomiting, or weakness/tingling in extremities.     MDM   Final diagnoses:  Contusion of face, initial encounter  Acute nonintractable headache, unspecified headache type   Minor head injury: Patient now 13 hours post injury. She has a normal neurological exam and no warning symptoms of intracranial injury. I do not feel a CT scan of her head is indicated at this time. No major or minor criteria of [place] head CT rules met.   Headache, similar to previous migraine headaches: Likely triggered by minor head injury. No neck injury or meningismus.  Treated in emergency department with migraine cocktail and Toradol. Appropriate return instructions discussed with patient.   Carlisle Cater, PA-C 10/20/13 [phone]

## 2013-10-20 NOTE — Discharge Instructions (Signed)
Please read and follow all provided instructions.  Your diagnoses today include:  1. Contusion of face, initial encounter   2. Acute nonintractable headache, unspecified headache type     Tests performed today include:  Vital signs. See below for your results today.   Medications:  In the Emergency Department you received:  Reglan - antinausea/headache medication  Benadryl - antihistamine to counteract potential side effects of reglan  Toradol - NSAID medication similar to ibuprofen  Take any prescribed medications only as directed.  Additional information:  Follow any educational materials contained in this packet.  You are having a headache. No specific cause was found today for your headache. It may have been a migraine or other cause of headache. Stress, anxiety, fatigue, and depression are common triggers for headaches.   Your headache today does not appear to be life-threatening or require hospitalization, but often the exact cause of headaches is not determined in the emergency department. Therefore, follow-up with your doctor is very important to find out what may have caused your headache and whether or not you need any further diagnostic testing or treatment.   Sometimes headaches can appear benign (not harmful), but then more serious symptoms can develop which should prompt an immediate re-evaluation by your doctor or the emergency department.  BE VERY CAREFUL not to take multiple medicines containing Tylenol (also called acetaminophen). Doing so can lead to an overdose which can damage your liver and cause liver failure and possibly death.   Follow-up instructions: Please follow-up with your primary care provider in the next 3 days for further evaluation of your symptoms.   Return instructions:   Please return to the Emergency Department if you experience worsening symptoms.  Return if the medications do not resolve your headache, if it recurs, or if you have  multiple episodes of vomiting or cannot keep down fluids.  Return if you have a change from the usual headache.  RETURN IMMEDIATELY IF you:  Develop a sudden, severe headache  Develop confusion or become poorly responsive or faint  Develop a fever above 100.22F or problem breathing  Have a change in speech, vision, swallowing, or understanding  Develop new weakness, numbness, tingling, incoordination in your arms or legs  Have a seizure  Please return if you have any other emergent concerns.  Additional Information:  Your vital signs today were: BP 124/65   Pulse 98   Temp(Src) 98.3 F (36.8 C) (Oral)   Resp 20   Ht 5\' 4"  (1.626 m)   Wt 210 lb (95.255 kg)   BMI 36.03 kg/m2   SpO2 100%   LMP 10/14/2013 If your blood pressure (BP) was elevated above 135/85 this visit, please have this repeated by your doctor within one month. --------------

## 2013-10-20 NOTE — ED Provider Notes (Signed)
Medical screening examination/treatment/procedure(s) were performed by non-physician practitioner and as supervising physician I was immediately available for consultation/collaboration.   Dione Boozeavid Damonie Furney, MD 10/20/13 [phone]

## 2014-02-10 ENCOUNTER — Encounter (HOSPITAL_BASED_OUTPATIENT_CLINIC_OR_DEPARTMENT_OTHER): Payer: Self-pay | Admitting: *Deleted

## 2014-02-10 ENCOUNTER — Emergency Department (HOSPITAL_BASED_OUTPATIENT_CLINIC_OR_DEPARTMENT_OTHER)
Admission: EM | Admit: 2014-02-10 | Discharge: 2014-02-11 | Disposition: A | Discharge status: Home / Self Care | Payer: Medicaid Other | Attending: Emergency Medicine | Admitting: Emergency Medicine

## 2014-02-10 DIAGNOSIS — R42 Dizziness and giddiness: Secondary | ICD-10-CM | POA: Diagnosis present

## 2014-02-10 DIAGNOSIS — Z3202 Encounter for pregnancy test, result negative: Secondary | ICD-10-CM | POA: Diagnosis not present

## 2014-02-10 DIAGNOSIS — E860 Dehydration: Secondary | ICD-10-CM | POA: Diagnosis not present

## 2014-02-10 DIAGNOSIS — Z79899 Other long term (current) drug therapy: Secondary | ICD-10-CM | POA: Diagnosis not present

## 2014-02-10 DIAGNOSIS — Z793 Long term (current) use of hormonal contraceptives: Secondary | ICD-10-CM | POA: Insufficient documentation

## 2014-02-10 DIAGNOSIS — R197 Diarrhea, unspecified: Secondary | ICD-10-CM | POA: Diagnosis not present

## 2014-02-10 DIAGNOSIS — Z792 Long term (current) use of antibiotics: Secondary | ICD-10-CM | POA: Insufficient documentation

## 2014-02-10 LAB — URINALYSIS, ROUTINE W REFLEX MICROSCOPIC
Bilirubin Urine: NEGATIVE
Glucose, UA: NEGATIVE mg/dL
KETONES UR: NEGATIVE mg/dL
LEUKOCYTES UA: NEGATIVE
Nitrite: NEGATIVE
Protein, ur: NEGATIVE mg/dL
Specific Gravity, Urine: 1.031 — ABNORMAL HIGH (ref 1.005–1.030)
Urobilinogen, UA: 1 mg/dL (ref 0.0–1.0)
pH: 6 (ref 5.0–8.0)

## 2014-02-10 LAB — PREGNANCY, URINE: Preg Test, Ur: NEGATIVE

## 2014-02-10 LAB — CBG MONITORING, ED: Glucose-Capillary: 88 mg/dL (ref 70–99)

## 2014-02-10 LAB — URINE MICROSCOPIC-ADD ON

## 2014-02-10 MED ORDER — SODIUM CHLORIDE 0.9 % IV BOLUS (SEPSIS)
1000.0000 mL | Freq: Once | INTRAVENOUS | Status: AC
  Administered 2014-02-11: 1000 mL via INTRAVENOUS

## 2014-02-10 NOTE — ED Provider Notes (Signed)
CSN: [id number]     Arrival date & time 02/10/14  2300 History  This chart was scribed for Hanley SeamenJohn L Cuong Moorman, MD by Gwenyth Oberatherine Macek, ED Scribe. This patient was seen in room MH07/MH07 and the patient's care was started at 11:38 PM.    Chief Complaint  Patient presents with  . Dizziness   The history is provided by the patient. No language interpreter was used.    HPI Comments: Jordan Hanson is a 19 y.o. female with a history of bipolar disorder and ADHD who presents to the Emergency Department complaining of light-headedness that started 2 weeks ago. Pt states she has increased fatigue and tachycardia. The last week she has had diarrhea that occurs 4 times daily and 1 syncopal episode that occurred 1 week ago while sitting. Pt states symptoms become worse when she stands, but are not relieved by sitting.  She denies chest pain, room-spinning sensation, nausea, vomiting, fever, abdominal pain, dysuria, vaginal discharge, abnormal vaginal bleeding, unexpected weight change and fever as associated symptoms. She has occasional shortness of breath but this is not an acute change. She denies change in sleep pattern.   Past Medical History  Diagnosis Date  . Bipolar disorder   . ADHD (attention deficit hyperactivity disorder)    Past Surgical History  Procedure Laterality Date  . Tonsillectomy     History reviewed. No pertinent family history. History  Substance Use Topics  . Smoking status: Never Smoker   . Smokeless tobacco: Not on file  . Alcohol Use: No   OB History    No data available     Review of Systems  A complete 10 system review of systems was obtained and all systems are negative except as noted in the HPI and PMH.    Allergies  Penicillins  Home Medications   Prior to Admission medications   Medication Sig Start Date End Date Taking? Authorizing Provider  divalproex (DEPAKOTE ER) 250 MG 24 hr tablet Take 750 mg by mouth at bedtime.    Historical Provider, MD  doxycycline  (VIBRAMYCIN) 100 MG capsule Take 1 capsule (100 mg total) by mouth 2 (two) times daily. 11/11/12   Teressa LowerVrinda Pickering, NP  ibuprofen (ADVIL,MOTRIN) 200 MG tablet Take 400 mg by mouth every 6 (six) hours as needed. For pain    Historical Provider, MD  norgestimate-ethinyl estradiol (SPRINTEC 28) 0.25-35 MG-MCG tablet Take 1 tablet by mouth daily.      Historical Provider, MD  oxyCODONE-acetaminophen (PERCOCET) 5-325 MG per tablet Take 1 tablet by mouth every 6 (six) hours as needed. 03/12/13   Richardean Canalavid H Yao, MD  QUEtiapine (SEROQUEL) 200 MG tablet Take 200 mg by mouth at bedtime.    Historical Provider, MD  sertraline (ZOLOFT) 100 MG tablet Take 100 mg by mouth daily.      Historical Provider, MD   BP 120/64 mmHg  Pulse 110  Temp(Src) 98.7 F (37.1 C) (Oral)  Resp 20  Ht 5\' 4"  (1.626 m)  Wt 220 lb (99.791 kg)  BMI 37.74 kg/m2  SpO2 100%  LMP 02/10/2014   Physical Exam  Nursing note and vitals reviewed. General: Well-developed, mildly obese female in no acute distress; appearance consistent with age of record HENT: normocephalic; atraumatic  Eyes: pupils equal, round and reactive to light; extraocular muscles intact Neck: supple; no thyromegaly, mild thyroid tenderness Heart: Regular rate and rhythm; no murmur; tachycardia Lungs: clear to auscultation bilaterally Abdomen: soft; nondistended; nontender; no masses or hepatosplenomegaly; bowel sounds present Extremities: No  deformity; full range of motion; pulses normal; multiple old, healed superficial linear scars of volar left forearm Neurologic: Awake, alert and oriented; motor function intact in all extremities and symmetric; no facial droop; normal coordination, speech and gait Skin: Warm and dry Psychiatric: Normal mood and affect   ED Course  Procedures (including critical care time) DIAGNOSTIC STUDIES: Oxygen Saturation is 100% on RA, normal by my interpretation.    COORDINATION OF CARE: 11:47 PM Discussed treatment plan with pt  at bedside and pt agreed to plan.   MDM   Nursing notes and vitals signs, including pulse oximetry, reviewed.  Summary of this visit's results, reviewed by myself:  Labs:  Results for orders placed or performed during the hospital encounter of 02/10/14 (from the past 24 hour(s))  CBG monitoring, ED     Status: None   Collection Time: 02/10/14 11:16 PM  Result Value Ref Range   Glucose-Capillary 88 70 - 99 mg/dL  Urinalysis, Routine w reflex microscopic     Status: Abnormal   Collection Time: 02/10/14 11:20 PM  Result Value Ref Range   Color, Urine YELLOW YELLOW   APPearance CLEAR CLEAR   Specific Gravity, Urine 1.031 (H) 1.005 - 1.030   pH 6.0 5.0 - 8.0   Glucose, UA NEGATIVE NEGATIVE mg/dL   Hgb urine dipstick MODERATE (A) NEGATIVE   Bilirubin Urine NEGATIVE NEGATIVE   Ketones, ur NEGATIVE NEGATIVE mg/dL   Protein, ur NEGATIVE NEGATIVE mg/dL   Urobilinogen, UA 1.0 0.0 - 1.0 mg/dL   Nitrite NEGATIVE NEGATIVE   Leukocytes, UA NEGATIVE NEGATIVE  Pregnancy, urine     Status: None   Collection Time: 02/10/14 11:20 PM  Result Value Ref Range   Preg Test, Ur NEGATIVE NEGATIVE  Urine microscopic-add on     Status: Abnormal   Collection Time: 02/10/14 11:20 PM  Result Value Ref Range   Squamous Epithelial / LPF FEW (A) RARE   WBC, UA 0-2 <3 WBC/hpf   RBC / HPF 7-10 <3 RBC/hpf   Bacteria, UA RARE RARE   Urine-Other MUCOUS PRESENT   CBC with Differential/Platelet     Status: Abnormal   Collection Time: 02/10/14 11:46 PM  Result Value Ref Range   WBC 8.0 4.0 - 10.5 K/uL   RBC 4.68 3.87 - 5.11 MIL/uL   Hemoglobin 12.9 12.0 - 15.0 g/dL   HCT 40.9 81.1 - 91.4 %   MCV 81.6 78.0 - 100.0 fL   MCH 27.6 26.0 - 34.0 pg   MCHC 33.8 30.0 - 36.0 g/dL   RDW 78.2 95.6 - 21.3 %   Platelets 295 150 - 400 K/uL   Neutrophils Relative % 32 (L) 43 - 77 %   Neutro Abs 2.6 1.7 - 7.7 K/uL   Lymphocytes Relative 56 (H) 12 - 46 %   Lymphs Abs 4.5 (H) 0.7 - 4.0 K/uL   Monocytes Relative 9 3 - 12  %   Monocytes Absolute 0.7 0.1 - 1.0 K/uL   Eosinophils Relative 2 0 - 5 %   Eosinophils Absolute 0.2 0.0 - 0.7 K/uL   Basophils Relative 1 0 - 1 %   Basophils Absolute 0.1 0.0 - 0.1 K/uL  Comprehensive metabolic panel     Status: Abnormal   Collection Time: 02/10/14 11:46 PM  Result Value Ref Range   Sodium 134 (L) 135 - 145 mmol/L   Potassium 4.1 3.5 - 5.1 mmol/L   Chloride 108 96 - 112 mmol/L   CO2 22 19 - 32 mmol/L  Glucose, Bld 132 (H) 70 - 99 mg/dL   BUN 10 6 - 23 mg/dL   Creatinine, Ser 4.09 0.50 - 1.10 mg/dL   Calcium 8.8 8.4 - 81.1 mg/dL   Total Protein 7.7 6.0 - 8.3 g/dL   Albumin 3.6 3.5 - 5.2 g/dL   AST 35 0 - 37 U/L   ALT 34 0 - 35 U/L   Alkaline Phosphatase 71 39 - 117 U/L   Total Bilirubin 0.5 0.3 - 1.2 mg/dL   GFR calc non Af Amer >90 >90 mL/min   GFR calc Af Amer >90 >90 mL/min   Anion gap 4 (L) 5 - 15  Valproic acid level     Status: Abnormal   Collection Time: 02/11/14 12:04 AM  Result Value Ref Range   Valproic Acid Lvl 24.2 (L) 50.0 - 100.0 ug/mL   2:16 AM Tachycardia has resolved and patient's symptoms are improved after 2 liters of normal saline IV. She was also given IV valproate acid level is low. Her symptomatology and thyroid tenderness or concerning for acute hyperthyroidism possibly due to thyroiditis. A TSH is pending but she was advised that we will not get the results of that in the ED and she will need to follow-up with her PCP promptly. Part of her lightheadedness this may be due to volume depletion from the diarrhea. Her urinalysis was consistent with dehydration.  I personally performed the services described in this documentation, which was scribed in my presence. The recorded information has been reviewed and is accurate.   Hanley Seamen, MD 02/11/14 [phone]

## 2014-02-10 NOTE — ED Notes (Signed)
Pt c/o dizziness x 2 weeks, reports syncopal episode x 1 week ago

## 2014-02-11 LAB — COMPREHENSIVE METABOLIC PANEL
ALBUMIN: 3.6 g/dL (ref 3.5–5.2)
ALK PHOS: 71 U/L (ref 39–117)
ALT: 34 U/L (ref 0–35)
ANION GAP: 4 — AB (ref 5–15)
AST: 35 U/L (ref 0–37)
BUN: 10 mg/dL (ref 6–23)
CHLORIDE: 108 mmol/L (ref 96–112)
CO2: 22 mmol/L (ref 19–32)
Calcium: 8.8 mg/dL (ref 8.4–10.5)
Creatinine, Ser: 0.7 mg/dL (ref 0.50–1.10)
GFR calc Af Amer: >90 mL/min (ref >90)
GFR calc non Af Amer: >90 mL/min (ref >90)
GLUCOSE: 132 mg/dL — AB (ref 70–99)
POTASSIUM: 4.1 mmol/L (ref 3.5–5.1)
SODIUM: 134 mmol/L — AB (ref 135–145)
Total Bilirubin: 0.5 mg/dL (ref 0.3–1.2)
Total Protein: 7.7 g/dL (ref 6.0–8.3)

## 2014-02-11 LAB — CBC WITH DIFFERENTIAL/PLATELET
BASOS ABS: 0.1 10*3/uL (ref 0.0–0.1)
Basophils Relative: 1 % (ref 0–1)
EOS PCT: 2 % (ref 0–5)
Eosinophils Absolute: 0.2 10*3/uL (ref 0.0–0.7)
HCT: 38.2 % (ref 36.0–46.0)
HEMOGLOBIN: 12.9 g/dL (ref 12.0–15.0)
Lymphocytes Relative: 56 % — ABNORMAL HIGH (ref 12–46)
Lymphs Abs: 4.5 10*3/uL — ABNORMAL HIGH (ref 0.7–4.0)
MCH: 27.6 pg (ref 26.0–34.0)
MCHC: 33.8 g/dL (ref 30.0–36.0)
MCV: 81.6 fL (ref 78.0–100.0)
Monocytes Absolute: 0.7 10*3/uL (ref 0.1–1.0)
Monocytes Relative: 9 % (ref 3–12)
NEUTROS ABS: 2.6 10*3/uL (ref 1.7–7.7)
NEUTROS PCT: 32 % — AB (ref 43–77)
Platelets: 295 10*3/uL (ref 150–400)
RBC: 4.68 MIL/uL (ref 3.87–5.11)
RDW: 12.7 % (ref 11.5–15.5)
WBC: 8 10*3/uL (ref 4.0–10.5)

## 2014-02-11 LAB — VALPROIC ACID LEVEL: Valproic Acid Lvl: 24.2 ug/mL — ABNORMAL LOW (ref 50.0–100.0)

## 2014-02-11 LAB — TSH: TSH: 1.86 u[IU]/mL (ref 0.350–4.500)

## 2014-02-11 MED ORDER — SODIUM CHLORIDE 0.9 % IV BOLUS (SEPSIS)
1000.0000 mL | Freq: Once | INTRAVENOUS | Status: AC
  Administered 2014-02-11: 1000 mL via INTRAVENOUS

## 2014-02-11 MED ORDER — VALPROATE SODIUM 500 MG/5ML IV SOLN: 500.0000 mg | Freq: Once | INTRAVENOUS | Status: AC

## 2014-02-11 MED ORDER — VALPROATE SODIUM 500 MG/5ML IV SOLN
INTRAVENOUS | Status: AC
  Administered 2014-02-11: 500 mg
  Filled 2014-02-11: qty 5

## 2014-02-11 NOTE — ED Notes (Signed)
MD at bedside. 

## 2014-05-29 ENCOUNTER — Encounter (HOSPITAL_COMMUNITY): Payer: Self-pay

## 2014-05-29 ENCOUNTER — Emergency Department (HOSPITAL_COMMUNITY)
Admission: EM | Admit: 2014-05-29 | Discharge: 2014-05-30 | Disposition: A | Discharge status: Home / Self Care | Payer: Medicaid Other | Attending: Emergency Medicine | Admitting: Emergency Medicine

## 2014-05-29 DIAGNOSIS — W19XXXA Unspecified fall, initial encounter: Secondary | ICD-10-CM | POA: Insufficient documentation

## 2014-05-29 DIAGNOSIS — F319 Bipolar disorder, unspecified: Secondary | ICD-10-CM | POA: Diagnosis not present

## 2014-05-29 DIAGNOSIS — Z79899 Other long term (current) drug therapy: Secondary | ICD-10-CM | POA: Insufficient documentation

## 2014-05-29 DIAGNOSIS — Z3202 Encounter for pregnancy test, result negative: Secondary | ICD-10-CM | POA: Insufficient documentation

## 2014-05-29 DIAGNOSIS — S0990XA Unspecified injury of head, initial encounter: Secondary | ICD-10-CM

## 2014-05-29 DIAGNOSIS — F10120 Alcohol abuse with intoxication, uncomplicated: Secondary | ICD-10-CM | POA: Diagnosis not present

## 2014-05-29 DIAGNOSIS — Z23 Encounter for immunization: Secondary | ICD-10-CM | POA: Insufficient documentation

## 2014-05-29 DIAGNOSIS — Y9389 Activity, other specified: Secondary | ICD-10-CM | POA: Diagnosis not present

## 2014-05-29 DIAGNOSIS — Z880 Allergy status to penicillin: Secondary | ICD-10-CM | POA: Diagnosis not present

## 2014-05-29 DIAGNOSIS — Y9289 Other specified places as the place of occurrence of the external cause: Secondary | ICD-10-CM | POA: Insufficient documentation

## 2014-05-29 DIAGNOSIS — Y998 Other external cause status: Secondary | ICD-10-CM | POA: Insufficient documentation

## 2014-05-29 DIAGNOSIS — F10920 Alcohol use, unspecified with intoxication, uncomplicated: Secondary | ICD-10-CM

## 2014-05-29 NOTE — ED Notes (Addendum)
Per EMS, pt found by a bystander on [address] laying face down on the sidewalk unresponsive. EMS called and on scene pt was breathing spontaneous just would not speak. Pt has hematoma on rt side of forehead. Pupils equal round and reactive. Pt has abrasion on right knee. Pt was tachycardic initially, 108 upon arrival. VS 138/78, HR 108, 100% on RA, CBG 117, RR 16. Pt complaining of head pain and some neck pain. Pt in c-collar and headblocks. Pt in no acute distress. Pt stated that she drank 1 bottle of champaigne and 2 margaritas. Pt states that she does not remember falling. Pt denies any drug use and states that she never drinks but did tonight.

## 2014-05-30 ENCOUNTER — Emergency Department (HOSPITAL_COMMUNITY): Payer: Medicaid Other

## 2014-05-30 LAB — POC URINE PREG, ED: Preg Test, Ur: NEGATIVE

## 2014-05-30 MED ORDER — IBUPROFEN 800 MG PO TABS
800.0000 mg | ORAL_TABLET | Freq: Once | ORAL | Status: AC
  Administered 2014-05-30: 800 mg via ORAL
  Filled 2014-05-30: qty 1

## 2014-05-30 MED ORDER — ONDANSETRON 4 MG PO TBDP
4.0000 mg | ORAL_TABLET | Freq: Once | ORAL | Status: AC
  Administered 2014-05-30: 4 mg via ORAL
  Filled 2014-05-30: qty 1

## 2014-05-30 MED ORDER — ACETAMINOPHEN 500 MG PO TABS
1000.0000 mg | ORAL_TABLET | Freq: Once | ORAL | Status: AC
  Administered 2014-05-30: 1000 mg via ORAL
  Filled 2014-05-30: qty 2

## 2014-05-30 MED ORDER — TETANUS-DIPHTH-ACELL PERTUSSIS 5-2.5-18.5 LF-MCG/0.5 IM SUSP
0.5000 mL | Freq: Once | INTRAMUSCULAR | Status: AC
  Administered 2014-05-30: 0.5 mL via INTRAMUSCULAR
  Filled 2014-05-30: qty 0.5

## 2014-05-30 NOTE — Discharge Instructions (Signed)
Your CT scan of your head and neck showed no abnormality. X-rays of your lumbar spine were also normal and showed no fracture. You may take Tylenol 1000 mg every 6 hours as needed for pain at home and alternate with ibuprofen 600 mg every 6 hours as needed for pain. Please rest and drink plenty of water over the next several days. Please avoid alcohol.   Alcohol Intoxication Alcohol intoxication occurs when the amount of alcohol that a person has consumed impairs his or her ability to mentally and physically function. Alcohol directly impairs the normal chemical activity of the brain. Drinking large amounts of alcohol can lead to changes in mental function and behavior, and it can cause many physical effects that can be harmful.  Alcohol intoxication can range in severity from mild to very severe. Various factors can affect the level of intoxication that occurs, such as the person's age, gender, weight, frequency of alcohol consumption, and the presence of other medical conditions (such as diabetes, seizures, or heart conditions). Dangerous levels of alcohol intoxication may occur when people drink large amounts of alcohol in a short period (binge drinking). Alcohol can also be especially dangerous when combined with certain prescription medicines or "recreational" drugs. SIGNS AND SYMPTOMS Some common signs and symptoms of mild alcohol intoxication include:  Loss of coordination.  Changes in mood and behavior.  Impaired judgment.  Slurred speech. As alcohol intoxication progresses to more severe levels, other signs and symptoms will appear. These may include:  Vomiting.  Confusion and impaired memory.  Slowed breathing.  Seizures.  Loss of consciousness. DIAGNOSIS  Your health care provider will take a medical history and perform a physical exam. You will be asked about the amount and type of alcohol you have consumed. Blood tests will be done to measure the concentration of alcohol in  your blood. In many places, your blood alcohol level must be lower than 80 mg/dL (1.61%) to legally drive. However, many dangerous effects of alcohol can occur at much lower levels.  TREATMENT  People with alcohol intoxication often do not require treatment. Most of the effects of alcohol intoxication are temporary, and they go away as the alcohol naturally leaves the body. Your health care provider will monitor your condition until you are stable enough to go home. Fluids are sometimes given through an IV access tube to help prevent dehydration.  HOME CARE INSTRUCTIONS  Do not drive after drinking alcohol.  Stay hydrated. Drink enough water and fluids to keep your urine clear or pale yellow. Avoid caffeine.   Only take over-the-counter or prescription medicines as directed by your health care provider.  SEEK MEDICAL CARE IF:   You have persistent vomiting.   You do not feel better after a few days.  You have frequent alcohol intoxication. Your health care provider can help determine if you should see a substance use treatment counselor. SEEK IMMEDIATE MEDICAL CARE IF:   You become shaky or tremble when you try to stop drinking.   You shake uncontrollably (seizure).   You throw up (vomit) blood. This may be bright red or may look like black coffee grounds.   You have blood in your stool. This may be bright red or may appear as a black, tarry, bad smelling stool.   You become lightheaded or faint.  MAKE SURE YOU:   Understand these instructions.  Will watch your condition.  Will get help right away if you are not doing well or get worse. Document Released: 10/04/2004  Document Revised: 08/27/2012 Document Reviewed: 05/30/2012 [facility] Patient Information 2015 [employer] [place] [place]. This information is not intended to replace advice given to you by your health care provider. Make sure you discuss any questions you have with your health care provider.  Head Injury You have  received a head injury. It does not appear serious at this time. Headaches and vomiting are common following head injury. It should be easy to awaken from sleeping. Sometimes it is necessary for you to stay in the emergency department for a while for observation. Sometimes admission to the hospital may be needed. After injuries such as yours, most problems occur within the first 24 hours, but side effects may occur up to 7-10 days after the injury. It is important for you to carefully monitor your condition and contact your health care provider or seek immediate medical care if there is a change in your condition. WHAT ARE THE TYPES OF HEAD INJURIES? Head injuries can be as minor as a bump. Some head injuries can be more severe. More severe head injuries include:  A jarring injury to the brain (concussion).  A bruise of the brain (contusion). This mean there is bleeding in the brain that can cause swelling.  A cracked skull (skull fracture).  Bleeding in the brain that collects, clots, and forms a bump (hematoma). WHAT CAUSES A HEAD INJURY? A serious head injury is most likely to happen to someone who is in a car wreck and is not wearing a seat belt. Other causes of major head injuries include bicycle or motorcycle accidents, sports injuries, and falls. HOW ARE HEAD INJURIES DIAGNOSED? A complete history of the event leading to the injury and your current symptoms will be helpful in diagnosing head injuries. Many times, pictures of the brain, such as CT or MRI are needed to see the extent of the injury. Often, an overnight hospital stay is necessary for observation.  WHEN SHOULD I SEEK IMMEDIATE MEDICAL CARE?  You should get help right away if:  You have confusion or drowsiness.  You feel sick to your stomach (nauseous) or have continued, forceful vomiting.  You have dizziness or unsteadiness that is getting worse.  You have severe, continued headaches not relieved by medicine. Only take  over-the-counter or prescription medicines for pain, fever, or discomfort as directed by your health care provider.  You do not have normal function of the arms or legs or are unable to walk.  You notice changes in the black spots in the center of the colored part of your eye (pupil).  You have a clear or bloody fluid coming from your nose or ears.  You have a loss of vision. During the next 24 hours after the injury, you must stay with someone who can watch you for the warning signs. This person should contact local emergency services (911 in the [place] if you have seizures, you become unconscious, or you are unable to wake up. HOW CAN I PREVENT A HEAD INJURY IN THE FUTURE? The most important factor for preventing major head injuries is avoiding motor vehicle accidents. To minimize the potential for damage to your head, it is crucial to wear seat belts while riding in motor vehicles. Wearing helmets while bike riding and playing collision sports (like football) is also helpful. Also, avoiding dangerous activities around the house will further help reduce your risk of head injury.  WHEN CAN I RETURN TO NORMAL ACTIVITIES AND ATHLETICS? You should be reevaluated by your health care provider before returning  to these activities. If you have any of the following symptoms, you should not return to activities or contact sports until 1 week after the symptoms have stopped:  Persistent headache.  Dizziness or vertigo.  Poor attention and concentration.  Confusion.  Memory problems.  Nausea or vomiting.  Fatigue or tire easily.  Irritability.  Intolerant of bright lights or loud noises.  Anxiety or depression.  Disturbed sleep. MAKE SURE YOU:   Understand these instructions.  Will watch your condition.  Will get help right away if you are not doing well or get worse. Document Released: 12/25/2004 Document Revised: 12/30/2012 Document Reviewed: 09/01/2012 [facility] Patient  Information 2015 [place], [place]. This information is not intended to replace advice given to you by your health care provider. Make sure you discuss any questions you have with your health care provider.

## 2014-05-30 NOTE — ED Provider Notes (Signed)
TIME SEEN: 11:59 PM   CHIEF COMPLAINT: Fall  HPI: Jordan Hanson is a 19 y.o. female with history of bipolar disorder, ADHD who presents to the Emergency Department for unwitnessed fall onset today PTA. Per EMS pt was found by a bystander laying face down on the sidewalk breathing but nonresponsive. Upon EMS arrival she was talking. Pt presents with an abrasion on right knee. Pt is complaining of lower back pain, neck pain and HA.  Pt admits to ETOH use - reports drinking a bottle of champagne and 2 margaritas. Pt denies drug use. Denies numbness or focal weakness. Is not on anticoagulation. Unclear when her last tetanus vaccination was.  ROS: See HPI Constitutional: no fever  Eyes: no drainage  ENT: no runny nose   Cardiovascular:  no chest pain  Resp: no SOB  GI: no vomiting GU: no dysuria Integumentary: no rash  Allergy: no hives  Musculoskeletal: no leg swelling  Neurological: no slurred speech ROS otherwise negative  PAST MEDICAL HISTORY/PAST SURGICAL HISTORY:  Past Medical History  Diagnosis Date  . Bipolar disorder   . ADHD (attention deficit hyperactivity disorder)     MEDICATIONS:  Prior to Admission medications   Medication Sig Start Date End Date Taking? Authorizing Provider  divalproex (DEPAKOTE ER) 250 MG 24 hr tablet Take 750 mg by mouth at bedtime.    Historical Provider, MD  doxycycline (VIBRAMYCIN) 100 MG capsule Take 1 capsule (100 mg total) by mouth 2 (two) times daily. 11/11/12   Teressa LowerVrinda Pickering, NP  ibuprofen (ADVIL,MOTRIN) 200 MG tablet Take 400 mg by mouth every 6 (six) hours as needed. For pain    Historical Provider, MD  norgestimate-ethinyl estradiol (SPRINTEC 28) 0.25-35 MG-MCG tablet Take 1 tablet by mouth daily.      Historical Provider, MD  oxyCODONE-acetaminophen (PERCOCET) 5-325 MG per tablet Take 1 tablet by mouth every 6 (six) hours as needed. 03/12/13   Richardean Canalavid H Yao, MD  QUEtiapine (SEROQUEL) 200 MG tablet Take 200 mg by mouth at bedtime.     Historical Provider, MD  sertraline (ZOLOFT) 100 MG tablet Take 100 mg by mouth daily.      Historical Provider, MD    ALLERGIES:  Allergies  Allergen Reactions  . Penicillins Swelling and Rash    SOCIAL HISTORY:  History  Substance Use Topics  . Smoking status: Never Smoker   . Smokeless tobacco: Not on file  . Alcohol Use: Yes     Comment: Pt drank tonight but states that she never drinks    FAMILY HISTORY: History reviewed. No pertinent family history.  EXAM: There were no vitals taken for this visit. CONSTITUTIONAL: Alert and oriented and responds appropriately to questions. Well-appearing; well-nourished; GCS 15 appears intoxicated and smells of alcohol.  HEAD: Normocephalic; atraumatic EYES: Conjunctivae clear, PERRL, EOMI ENT: normal nose; no rhinorrhea; moist mucous membranes; pharynx without lesions noted; no dental injury; no septal hematoma NECK: Supple, no meningismus, no LAD; diffuse spinal tenderness but no step off or deformity, cervical collar in place.  CARD: regular and tachycardic; S1 and S2 appreciated; no murmurs, no clicks, no rubs, no gallops RESP: Normal chest excursion without splinting or tachypnea; breath sounds clear and equal bilaterally; no wheezes, no rhonchi, no rales; no hypoxia or respiratory distress CHEST:  chest wall stable, no crepitus or ecchymosis or deformity, nontender to palpation ABD/GI: Normal bowel sounds; non-distended; soft, non-tender, no rebound, no guarding PELVIS:  stable, nontender to palpation BACK:  The back appears normal and is tender  to palpation over the lumbar spine without step-off or deformity there is no CVA tenderness EXT: Normal ROM in all joints; non-tender to palpation; no edema; normal capillary refill; no cyanosis, no bony tenderness or bony deformity of patient's extremities, no joint effusion, no ecchymosis or lacerations    SKIN: Normal color for age and race; warm abrasion to right knee, appears to have self  inflicted lacerations to anterior left thigh NEURO: Moves all extremities equally, sensation to light touch intact diffusely, cranial nerves II through XII intact PSYCH: The patient's mood and manner are appropriate. Grooming and personal hygiene are appropriate.  MEDICAL DECISION MAKING: Patient here intoxicated, found down. We'll obtain CT of her head, cervical spine. We'll also obtain an x-ray of her lumbar spine. Given abrasions and what appears to be old self inflicted superficial lacerations will update tetanus vaccination. We'll give Tylenol, Zofran.  ED PROGRESS: CT of her head and cervical spine show no acute injury. X-ray of her lumbar spine shows no acute injury. She is not clinically sober. C-spine has been cleared clinically and c-collar removed. We'll discharge home with sober driver. Advised her to avoid alcohol, alternate Tylenol and Motrin for pain. Discussed head injury return precautions. She verbalized understanding and is comfortable with plan.     EKG Interpretation  Date/Time:  Sunday May 30 2014 00:09:22 EDT Ventricular Rate:  118 PR Interval:  164 QRS Duration: 104 QT Interval:  335 QTC Calculation: 469 R Axis:   76 Text Interpretation:  Sinus tachycardia Borderline Q waves in lateral leads Borderline T wave abnormalities No significant change since last tracing Confirmed by WARD,  DO, KRISTEN ([id number]) on 05/30/2014 12:23:32 AM          I personally performed the services described in this documentation, which was scribed in my presence. The recorded information has been reviewed and is accurate.      Layla Maw Ward, DO 05/30/14 [phone]

## 2014-05-30 NOTE — ED Notes (Signed)
Pt given water and encouraged to drink 

## 2014-05-30 NOTE — ED Notes (Signed)
Pt verbalized understanding of d/c instructions and has no further questions.  

## 2014-06-17 ENCOUNTER — Encounter (HOSPITAL_COMMUNITY): Payer: Self-pay | Admitting: *Deleted

## 2014-06-17 ENCOUNTER — Emergency Department (HOSPITAL_COMMUNITY)
Admission: EM | Admit: 2014-06-17 | Discharge: 2014-06-17 | Disposition: A | Discharge status: Other Acute Inpatient Hospital | Payer: Medicaid Other | Attending: Emergency Medicine | Admitting: Emergency Medicine

## 2014-06-17 ENCOUNTER — Inpatient Hospital Stay (HOSPITAL_COMMUNITY)
Admission: AD | Admit: 2014-06-17 | Discharge: 2014-06-20 | DRG: 885 | Disposition: A | Discharge status: Home / Self Care | Payer: Medicaid Other | Source: Intra-hospital | Attending: Psychiatry | Admitting: Psychiatry

## 2014-06-17 DIAGNOSIS — Y9289 Other specified places as the place of occurrence of the external cause: Secondary | ICD-10-CM | POA: Insufficient documentation

## 2014-06-17 DIAGNOSIS — Z880 Allergy status to penicillin: Secondary | ICD-10-CM | POA: Insufficient documentation

## 2014-06-17 DIAGNOSIS — Z79899 Other long term (current) drug therapy: Secondary | ICD-10-CM | POA: Diagnosis not present

## 2014-06-17 DIAGNOSIS — F319 Bipolar disorder, unspecified: Secondary | ICD-10-CM | POA: Diagnosis not present

## 2014-06-17 DIAGNOSIS — Y998 Other external cause status: Secondary | ICD-10-CM | POA: Insufficient documentation

## 2014-06-17 DIAGNOSIS — Y9389 Activity, other specified: Secondary | ICD-10-CM | POA: Diagnosis not present

## 2014-06-17 DIAGNOSIS — S60811A Abrasion of right wrist, initial encounter: Secondary | ICD-10-CM | POA: Diagnosis not present

## 2014-06-17 DIAGNOSIS — F332 Major depressive disorder, recurrent severe without psychotic features: Secondary | ICD-10-CM | POA: Diagnosis not present

## 2014-06-17 DIAGNOSIS — Z3202 Encounter for pregnancy test, result negative: Secondary | ICD-10-CM | POA: Diagnosis not present

## 2014-06-17 DIAGNOSIS — R45851 Suicidal ideations: Secondary | ICD-10-CM | POA: Diagnosis present

## 2014-06-17 DIAGNOSIS — X789XXA Intentional self-harm by unspecified sharp object, initial encounter: Secondary | ICD-10-CM | POA: Diagnosis not present

## 2014-06-17 DIAGNOSIS — F419 Anxiety disorder, unspecified: Secondary | ICD-10-CM | POA: Diagnosis present

## 2014-06-17 DIAGNOSIS — Z792 Long term (current) use of antibiotics: Secondary | ICD-10-CM | POA: Diagnosis not present

## 2014-06-17 DIAGNOSIS — F329 Major depressive disorder, single episode, unspecified: Secondary | ICD-10-CM | POA: Diagnosis present

## 2014-06-17 DIAGNOSIS — F322 Major depressive disorder, single episode, severe without psychotic features: Secondary | ICD-10-CM | POA: Diagnosis not present

## 2014-06-17 HISTORY — DX: Depression, unspecified: F32.A

## 2014-06-17 HISTORY — DX: Major depressive disorder, single episode, unspecified: F32.9

## 2014-06-17 LAB — CBC WITH DIFFERENTIAL/PLATELET
BASOS PCT: 1 % (ref 0–1)
Basophils Absolute: 0.1 10*3/uL (ref 0.0–0.1)
EOS ABS: 0.1 10*3/uL (ref 0.0–0.7)
Eosinophils Relative: 1 % (ref 0–5)
HEMATOCRIT: 39.8 % (ref 36.0–46.0)
HEMOGLOBIN: 12.8 g/dL (ref 12.0–15.0)
Lymphocytes Relative: 30 % (ref 12–46)
Lymphs Abs: 2.4 10*3/uL (ref 0.7–4.0)
MCH: 27.4 pg (ref 26.0–34.0)
MCHC: 32.2 g/dL (ref 30.0–36.0)
MCV: 85.2 fL (ref 78.0–100.0)
Monocytes Absolute: 0.9 10*3/uL (ref 0.1–1.0)
Monocytes Relative: 12 % (ref 3–12)
Neutro Abs: 4.4 10*3/uL (ref 1.7–7.7)
Neutrophils Relative %: 56 % (ref 43–77)
PLATELETS: 323 10*3/uL (ref 150–400)
RBC: 4.67 MIL/uL (ref 3.87–5.11)
RDW: 13 % (ref 11.5–15.5)
WBC: 7.8 10*3/uL (ref 4.0–10.5)

## 2014-06-17 LAB — BASIC METABOLIC PANEL
Anion gap: 10 (ref 5–15)
BUN: 11 mg/dL (ref 6–20)
CALCIUM: 9.3 mg/dL (ref 8.9–10.3)
CO2: 25 mmol/L (ref 22–32)
CREATININE: 0.41 mg/dL — AB (ref 0.44–1.00)
Chloride: 103 mmol/L (ref 101–111)
GFR calc Af Amer: >60 mL/min (ref >60)
GFR calc non Af Amer: >60 mL/min (ref >60)
GLUCOSE: 96 mg/dL (ref 65–99)
Potassium: 3.8 mmol/L (ref 3.5–5.1)
SODIUM: 138 mmol/L (ref 135–145)

## 2014-06-17 LAB — RAPID URINE DRUG SCREEN, HOSP PERFORMED
Amphetamines: NOT DETECTED
Barbiturates: NOT DETECTED
Benzodiazepines: NOT DETECTED
COCAINE: NOT DETECTED
Opiates: NOT DETECTED
Tetrahydrocannabinol: NOT DETECTED

## 2014-06-17 LAB — ETHANOL: Alcohol, Ethyl (B): <5 mg/dL (ref <5)

## 2014-06-17 LAB — ACETAMINOPHEN LEVEL: Acetaminophen (Tylenol), Serum: <10 ug/mL — ABNORMAL LOW (ref 10–30)

## 2014-06-17 LAB — POC URINE PREG, ED: Preg Test, Ur: NEGATIVE

## 2014-06-17 MED ORDER — SERTRALINE HCL 100 MG PO TABS
100.0000 mg | ORAL_TABLET | Freq: Every day | ORAL | Status: DC
  Administered 2014-06-17 – 2014-06-19 (×3): 100 mg via ORAL
  Filled 2014-06-17 (×2): qty 1
  Filled 2014-06-17: qty 3
  Filled 2014-06-17 (×3): qty 1

## 2014-06-17 MED ORDER — ACETAMINOPHEN 325 MG PO TABS
650.0000 mg | ORAL_TABLET | Freq: Four times a day (QID) | ORAL | Status: DC | PRN
  Administered 2014-06-18 – 2014-06-20 (×4): 650 mg via ORAL
  Filled 2014-06-17 (×4): qty 2

## 2014-06-17 MED ORDER — ALUM & MAG HYDROXIDE-SIMETH 200-200-20 MG/5ML PO SUSP
30.0000 mL | ORAL | Status: DC | PRN
  Administered 2014-06-19: 30 mL via ORAL
  Filled 2014-06-17: qty 30

## 2014-06-17 MED ORDER — MAGNESIUM HYDROXIDE 400 MG/5ML PO SUSP: 30.0000 mL | Freq: Every day | ORAL | Status: DC | PRN

## 2014-06-17 MED ORDER — DIVALPROEX SODIUM ER 500 MG PO TB24
750.0000 mg | ORAL_TABLET | Freq: Every day | ORAL | Status: DC
  Administered 2014-06-17 – 2014-06-18 (×2): 750 mg via ORAL
  Filled 2014-06-17 (×6): qty 1

## 2014-06-17 MED ORDER — TRAZODONE HCL 50 MG PO TABS: 50.0000 mg | ORAL_TABLET | Freq: Every evening | ORAL | Status: DC | PRN

## 2014-06-17 MED ORDER — QUETIAPINE FUMARATE 300 MG PO TABS
300.0000 mg | ORAL_TABLET | Freq: Every day | ORAL | Status: DC
  Administered 2014-06-17 – 2014-06-19 (×3): 300 mg via ORAL
  Filled 2014-06-17 (×5): qty 1
  Filled 2014-06-17: qty 3

## 2014-06-17 NOTE — Progress Notes (Signed)
Pt attended [employer]. Lindalou Hose. 06/17/2014
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
## 2014-06-17 NOTE — ED Notes (Signed)
Pt spoke with psychiatrist on computer.
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
## 2014-06-17 NOTE — ED Provider Notes (Signed)
CSN: [id number]     Arrival date & time 06/17/14  1246 History   First MD Initiated Contact with Patient 06/17/14 1311     Chief Complaint  Patient presents with  . Self-Harm      The history is provided by the patient. No language interpreter was used.   Jordan Hanson presents for psychiatric evaluation. When asked why she is here today the patient states "life". She states she has had multiple stressors lately and currently is living in a shelter. She states that life caught up with her today and she was upset she left work because she didn't feel like she could be at work. She went to text her friends to see if they can be there to talk to her and they were not available at that time so she went to cut her arm. She was suicidal at that time. She states she wanted to end it all. She currently wishes to go back to the shelter. She denies current SI.  She feels she has very little emotional support and feels alone.  Sxs are severe, waxing and waning.   Past Medical History  Diagnosis Date  . Bipolar disorder   . ADHD (attention deficit hyperactivity disorder)    Past Surgical History  Procedure Laterality Date  . Tonsillectomy     No family history on file. History  Substance Use Topics  . Smoking status: Never Smoker   . Smokeless tobacco: Not on file  . Alcohol Use: Yes     Comment: Pt drank tonight but states that she never drinks   OB History    No data available     Review of Systems  All other systems reviewed and are negative.     Allergies  Penicillins  Home Medications   Prior to Admission medications   Medication Sig Start Date End Date Taking? Authorizing Provider  divalproex (DEPAKOTE ER) 250 MG 24 hr tablet Take 750 mg by mouth at bedtime.   Yes Historical Provider, MD  ibuprofen (ADVIL,MOTRIN) 200 MG tablet Take 400 mg by mouth every 6 (six) hours as needed. For pain   Yes Historical Provider, MD  Norgestimate-Ethinyl Estradiol Triphasic 0.18/0.215/0.25 MG-35  MCG tablet Take by mouth. Last night   Yes Historical Provider, MD  QUEtiapine (SEROQUEL) 200 MG tablet Take 200 mg by mouth at bedtime.   Yes Historical Provider, MD  sertraline (ZOLOFT) 100 MG tablet Take 100 mg by mouth daily.     Yes Historical Provider, MD  doxycycline (VIBRAMYCIN) 100 MG capsule Take 1 capsule (100 mg total) by mouth 2 (two) times daily. Patient not taking: Reported on 05/30/2014 11/11/12   Teressa Lower, NP  MONONESSA 0.25-35 MG-MCG tablet Take 1 tablet by mouth daily at 8 pm. 05/18/14   Historical Provider, MD  oxyCODONE-acetaminophen (PERCOCET) 5-325 MG per tablet Take 1 tablet by mouth every 6 (six) hours as needed. Patient not taking: Reported on 05/30/2014 03/12/13   Richardean Canal, MD  QUEtiapine (SEROQUEL) 300 MG tablet Take 1 tablet by mouth at bedtime. 05/23/14   Historical Provider, MD   BP 136/86 mmHg  Pulse 99  Temp(Src) 99 F (37.2 C) (Oral)  Resp 16  SpO2 98%  LMP 04/29/2014 Physical Exam  Constitutional: She is oriented to person, place, and time. She appears well-developed and well-nourished.  HENT:  Head: Normocephalic and atraumatic.  Cardiovascular: Normal rate and regular rhythm.   Pulmonary/Chest: Effort normal. No respiratory distress.  Musculoskeletal:  Superficial abrasions to right  wrist  Neurological: She is alert and oriented to person, place, and time.  Skin: Skin is warm and dry.  Psychiatric:  Tearful, depressed mood and affect  Nursing note and vitals reviewed.   ED Course  Procedures (including critical care time) Labs Review Labs Reviewed  BASIC METABOLIC PANEL - Abnormal; Notable for the following:    Creatinine, Ser 0.41 (*)    All other components within normal limits  ACETAMINOPHEN LEVEL - Abnormal; Notable for the following:    Acetaminophen (Tylenol), Serum <10 (*)    All other components within normal limits  CBC WITH DIFFERENTIAL/PLATELET  URINE RAPID DRUG SCREEN (HOSP PERFORMED) NOT AT [facility]  ETHANOL  POC URINE  PREG, ED    Imaging Review No results found.   EKG Interpretation None      MDM   Final diagnoses:  Suicidal ideations    Patient with history of suicidal ideation here after having self injurious behavior and suicidal thoughts. Patient has been medically cleared for psychiatric evaluation.    Tilden Fossa, MD 06/17/14 [phone]

## 2014-06-17 NOTE — ED Notes (Signed)
Report called to [place] at [facility] called for transport

## 2014-06-18 DIAGNOSIS — F419 Anxiety disorder, unspecified: Secondary | ICD-10-CM

## 2014-06-18 DIAGNOSIS — F322 Major depressive disorder, single episode, severe without psychotic features: Secondary | ICD-10-CM

## 2014-06-18 MED ORDER — LOPERAMIDE HCL 2 MG PO CAPS
2.0000 mg | ORAL_CAPSULE | ORAL | Status: DC | PRN
  Administered 2014-06-18: 2 mg via ORAL
  Filled 2014-06-18: qty 1

## 2014-06-18 MED ORDER — BOOST / RESOURCE BREEZE PO LIQD
1.0000 | Freq: Two times a day (BID) | ORAL | Status: DC
  Administered 2014-06-18 – 2014-06-19 (×2): 1 via ORAL
  Filled 2014-06-18 (×9): qty 1

## 2014-06-18 NOTE — Tx Team (Signed)
Interdisciplinary Treatment Plan Update (Adult) Date: 06/18/2014   Time Reviewed: 9:30 AM  Progress in Treatment: Attending groups: Continuing to assess, patient new to milieu Participating in groups: Continuing to assess, patient new to milieu Taking medication as prescribed: Yes Tolerating medication: Yes Family/Significant other contact made: Continuing to assess for appropriate contacts Patient understands diagnosis: Yes Discussing patient identified problems/goals with staff: Yes Medical problems stabilized or resolved: Yes Denies suicidal/homicidal ideation: Yes Issues/concerns per patient self-inventory: Yes Other:  New problem(s) identified: N/A  Discharge Plan or Barriers:  6/10: Patient will return to [employer] and follow up with [facility].  Reason for Continuation of Hospitalization:  Depression Anxiety Medication Stabilization   Comments: N/A  Estimated length of stay: Discharge anticipated for Sunday 6/12.  For review of initial/current patient goals, please see plan of care.  Attendees: Patient:    Family:    Physician: Dr. Jama Flavors; Dr. Dub Mikes 06/18/2014 9:30 AM  Nursing: Eula Fried , RN 06/18/2014 9:30 AM  Clinical Social Worker: Belenda Cruise Abubakr Wieman, LCSWA 06/18/2014 9:30 AM  Other: Chad Cordial, LCSWA 06/18/2014 9:30 AM  Other: Leisa Lenz, Vesta Mixer Liaison 06/18/2014 9:30 AM  Other: Onnie Boer, Case Manager 06/18/2014 9:30 AM  Other:  06/18/2014 9:30 AM  Other:    Other:    Other:    Other:    Other:      Scribe for Treatment Team:  [name], MSW, [employer] [phone]
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
## 2014-06-18 NOTE — [facility] Group Notes (Signed)
[facility] LCSW Group Therapy 06/18/2014 1:15 PM Type of Therapy: Group Therapy Participation Level: Active  Participation Quality: Attentive, Sharing and Supportive  Affect: Appropriate  Cognitive: Alert and Oriented  Insight: Developing/Improving and Engaged  Engagement in Therapy: Developing/Improving and Engaged  Modes of Intervention: Clarification, Confrontation, Discussion, Education, Exploration, Limit-setting, Orientation, Problem-solving, Rapport Building, Dance movement psychotherapist, Socialization and Support  Summary of Progress/Problems: The topic for today was feelings about relapse. Pt discussed what relapse prevention is to them and identified triggers that they are on the path to relapse. Pt processed their feeling towards relapse and was able to relate to peers. Pt discussed coping skills that can be used for relapse prevention. Patient discussed relapsing into drinking and cutting. Patient shared that hospitalization has helped her to focus more on herself and taking better care of herself. CSW and other group members provided patient with emotional support and encouragement.   [name], MSW, [employer] Clinical Social Worker [facility] [phone]

## 2014-06-18 NOTE — [facility] Group Notes (Signed)
   [facility] LCSW Aftercare Discharge Planning Group Note  06/18/2014  8:45 AM   Participation Quality: Alert, Appropriate and Oriented  Mood/Affect: Depressed and Flat  Depression Rating: 0  Anxiety Rating: 0  Thoughts of Suicide: Pt denies SI/HI  Will you contract for safety? Yes  Current AVH: Pt denies  Plan for Discharge/Comments: Pt attended discharge planning group and actively participated in group. CSW provided pt with today's workbook. Patient will return to shelter to follow up with outpatient services at [facility].  Transportation Means: Pt reports access to transportation  Supports: No supports mentioned at this time  [name], MSW, [employer] Clinical Social Worker [employer] [phone]

## 2014-06-18 NOTE — Progress Notes (Signed)
Adult Psychoeducational Group Note  Date:  06/18/2014 Time:  10:00AM  Group Topic/Focus:  Making Healthy Choices:   The focus of this group is to help patients identify negative/unhealthy choices they were using prior to admission and identify positive/healthier coping strategies to replace them upon discharge.  Participation Level:  Active  Participation Quality:  Appropriate  Affect:  Appropriate  Cognitive:  Appropriate  Insight: Appropriate  Engagement in Group:  Engaged  Modes of Intervention:  Discussion  Additional Comments:  Pt was active and participated during the entire group. Pt identified the "ABC's of Wellness," identifying a positive coping skill for every letter of the alphabet   Cortnee Steinmiller K 06/18/2014, 10:29 AM

## 2014-06-18 NOTE — Tx Team (Addendum)
Initial Interdisciplinary Treatment Plan   PATIENT STRESSORS: Financial difficulties Occupational concerns   PATIENT STRENGTHS: Active sense of humor Capable of independent living Communication skills Work skills   PROBLEM LIST: Problem List/Patient Goals Date to be addressed Date deferred Reason deferred Estimated date of resolution  "Life" "Today was just a bad day"      Depression with SI                                                 DISCHARGE CRITERIA:  Adequate post-discharge living arrangements Motivation to continue treatment in a less acute level of care Reduction of life-threatening or endangering symptoms to within safe limits Verbal commitment to aftercare and medication compliance  PRELIMINARY DISCHARGE PLAN: Placement in alternative living arrangements Return to previous work or school arrangements  PATIENT/FAMIILY INVOLVEMENT: This treatment plan has been presented to and reviewed with the patient, Jordan Hanson, and/or family member, .  The patient and family have been given the opportunity to ask questions and make suggestions.  Cooper Render 06/18/2014, 4:42 AM

## 2014-06-18 NOTE — Progress Notes (Signed)
D: Pt has appropriate affect and anxious mood.  Pt reports her goal tonight was to "call my boss and have an open discussion with him."  After the call, pt reported that the conversation went well and that she is "happy I still have a job."  Pt reports she had a good visit with a "friend from the shelter and another friend."  Pt denies SI/HI, denies hallucinations.  Pt has been visible in milieu interacting with peers and staff appropriately.  Pt attended evening group.   A: Introduced self to pt.  Met with pt 1:1 and provided support and encouragement.  Actively listened to pt.  Pt complained of diarrhea.  On-call provider notified and PRN medication for diarrhea ordered and administered.  Medications administered per order.  PRN medication administered for pain. R: Pt is compliant with medications.  Pt verbally contracts for safety.  Will continue to monitor and assess.

## 2014-06-18 NOTE — Progress Notes (Signed)
NUTRITION NOTE  NUTRITION ASSESSMENT  Pt identified as at risk on the Malnutrition Screen Tool  INTERVENTION: 1. Educated patient on the importance of nutrition and encouraged intake of food and beverages. 2. Discussed weight goals. 3. Supplements: will order Resource Breeze po BID, each supplement provides 250 kcal and 9 grams of protein   NUTRITION DIAGNOSIS: Unintentional weight loss related to early satiety with decreased intakes as evidenced by pt report.   Goal: Pt to meet >/= 90% of their estimated nutrition needs.  Monitor:  PO intake  Assessment:  Pt seen for MST. Pt reports that she was recently dx with hyperthyroidism and that she often gets full very quickly. Pt is homeless and has been living at [facility]. She states that over the past 3 months she lost 50 lbs due to hyperthyroidism. This AM for breakfast she ate hashbrowns, a biscuit and bacon. Encouraged pt to eat a protein source at eat meal for muscle integrity with weight loss.  Will order Resource Breeze BID to supplement.  19 y.o. female  Height: Ht Readings from Last 1 Encounters:  06/17/14 5' 3.78" (1.62 m)    Weight: Wt Readings from Last 1 Encounters:  06/17/14 211 lb (95.709 kg)    Weight Hx: Wt Readings from Last 10 Encounters:  06/17/14 211 lb (95.709 kg)  05/30/14 210 lb (95.255 kg)  02/10/14 220 lb (99.791 kg)  10/19/13 210 lb (95.255 kg)  09/09/13 210 lb (95.255 kg)  05/07/13 210 lb (95.255 kg)  03/12/13 210 lb (95.255 kg) (98 %*, Z = 2.08)  02/26/13 210 lb (95.255 kg) (98 %*, Z = 2.08)  11/25/12 210 lb (95.255 kg) (98 %*, Z = 2.08)  11/13/12 210 lb (95.255 kg) (98 %*, Z = 2.08)   * Growth percentiles are based on CDC 2-20 Years data.    BMI:  Body mass index is 36.47 kg/(m^2). Pt meets criteria for obeisty based on current BMI.  Estimated Nutritional Needs: Kcal: 25-30 kcal/kg Protein: > 1 gram protein/kg Fluid: 1 ml/kcal  Diet Order: Diet regular Room service  appropriate?: No; Fluid consistency:: Thin Pt is also offered choice of unit snacks mid-morning and mid-afternoon.  Pt is eating as desired.   Lab results and medications reviewed.    Trenton Gammon, RD, LDN Inpatient Clinical Dietitian Pager # [phone] After hours/weekend pager # [phone]

## 2014-06-18 NOTE — [facility] Suicide Risk Assessment (Signed)
[facility] INPATIENT:  Family/Significant Other Suicide Prevention Education  Suicide Prevention Education:  Patient Refusal for Family/Significant Other Suicide Prevention Education: The patient Jordan Hanson has refused to provide written consent for family/significant other to be provided Family/Significant Other Suicide Prevention Education during admission and/or prior to discharge.  Physician notified. SPE reviewed with patient and brochure provided. Patient encouraged to return to hospital if having suicidal thoughts, patient verbalized his/her understanding and has no further questions at this time.   Alizabeth Antonio, [place] 06/18/2014, 4:51 PM

## 2014-06-18 NOTE — Progress Notes (Signed)
Recreation Therapy Notes  Date: 06.10.16 Time: 9:30 am Location: [address] Group Room  Group Topic: Stress Management  Goal Area(s) Addresses:  Patient will verbalize importance of using healthy stress management.  Patient will identify positive emotions associated with healthy stress management.   Intervention: Stress Management  Activity :  Progressive Muscle Relaxation.  LRT introduced and educated patients on stress management technique of progressive muscle relaxation.  A script was used to deliver the technique to patients.  Patients were asked to follow script read a loud by LRT to engage in practicing the stress management technique.  Education:  Stress Management, Discharge Planning.   Clinical Observations/Feedback: Patient did not attend group.   Caroll Rancher, LRT/CTRS         Lillia Abed, Melvin Whiteford A 06/18/2014 3:24 PM

## 2014-06-18 NOTE — Progress Notes (Signed)
  [facility] Adult Case Management Discharge Plan :  Will you be returning to the same living situation after discharge:  Yes,  patient plans to return to the shelter At discharge, do you have transportation home?: Yes,  patient reports access to transportation Do you have the ability to pay for your medications: Yes,  patient will be provided with medication samples and prescriptions\  Release of information consent forms completed and in the chart;  Patient's signature needed at discharge.  Patient to Follow up at: Follow-up Information    Follow up with [facility].   Why:  Medication management appointment on Thursday June 23rd 10:40 am with Tamela Oddi. Therapy appointment with Gunnar Fusi on Monday July 18th at 10 am. Please call office if you need to reschedule.    Contact information:   [facility] [address], [address], [place], [place] [address] [phone]      Patient denies SI/HI: Yes,  denies    Safety Planning and Suicide Prevention discussed: Yes,  with patient  Have you used any form of tobacco in the last 30 days? (Cigarettes, Smokeless Tobacco, Cigars, and/or Pipes): No  Has patient been referred to the Quitline?:No, patient is not a smoker  Mechille Varghese, [place] 06/18/2014, 5:41 PM
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
## 2014-06-18 NOTE — Progress Notes (Signed)
Adult Psychoeducational Group Note  Date:  06/18/2014 Time:  9:08 PM  Group Topic/Focus:  Wrap-Up Group:   The focus of this group is to help patients review their daily goal of treatment and discuss progress on daily workbooks.  Participation Level:  Active  Participation Quality:  Appropriate  Affect:  Appropriate  Cognitive:  Alert  Insight: Appropriate  Engagement in Group:  Engaged  Modes of Intervention:  Discussion  Additional Comments:  Pt stated that today has been a good day and she is looking forward to being released.   Kaleen Odea R 06/18/2014, 9:08 PM

## 2014-06-18 NOTE — Clinical Social Work Note (Signed)
At patient's request, CSW contacted her employer [employer] [phone].  [name], MSW, [employer] Clinical Social Worker [facility] [phone]

## 2014-06-18 NOTE — Progress Notes (Signed)
Patient ID: Jordan Hanson, female   DOB: [birth year], 19 y.o.   MRN: [id number] Requested to sign a 72 hour form for discharge. Reviewed meaning of the 72 hour form and she signed it. She denies any plans to hurt self or others and feels ready for discharge. Has not yet seen the Dr today, encouraged to make her case to the Dr. For discharge,

## 2014-06-18 NOTE — Progress Notes (Signed)
D Pt. Presents to [facility] after peers at the shelter where she resides witnessed her cutting self superficially.  Pt. Presently denies SI and HI, no complaints of pain or discomfort noted at present time.  A Writer offered support and encouragement, Discussed current medications with pt.  R Pt. Remains safe on the unit.  Pt. Was very pleasant, calm and appeared relaxed throughout the evening.  Pt. Denies any depression this pm, reported some anxiety but states that has also decreased.

## 2014-06-19 ENCOUNTER — Encounter (HOSPITAL_COMMUNITY): Payer: Self-pay | Admitting: Registered Nurse

## 2014-06-19 DIAGNOSIS — F332 Major depressive disorder, recurrent severe without psychotic features: Secondary | ICD-10-CM | POA: Diagnosis present

## 2014-06-19 LAB — LIPID PANEL
CHOLESTEROL: 200 mg/dL (ref 0–200)
HDL: 39 mg/dL — ABNORMAL LOW (ref >40)
LDL CALC: 126 mg/dL — AB (ref 0–99)
TRIGLYCERIDES: 174 mg/dL — AB (ref <150)
Total CHOL/HDL Ratio: 5.1 RATIO
VLDL: 35 mg/dL (ref 0–40)

## 2014-06-19 LAB — HEPATIC FUNCTION PANEL
ALBUMIN: 3.3 g/dL — AB (ref 3.5–5.0)
ALT: 30 U/L (ref 14–54)
AST: 25 U/L (ref 15–41)
Alkaline Phosphatase: 55 U/L (ref 38–126)
Total Bilirubin: 0.3 mg/dL (ref 0.3–1.2)
Total Protein: 7.1 g/dL (ref 6.5–8.1)

## 2014-06-19 LAB — TSH: TSH: 3.601 u[IU]/mL (ref 0.350–4.500)

## 2014-06-19 LAB — VALPROIC ACID LEVEL: Valproic Acid Lvl: 46 ug/mL — ABNORMAL LOW (ref 50.0–100.0)

## 2014-06-19 MED ORDER — DIVALPROEX SODIUM ER 500 MG PO TB24
1000.0000 mg | ORAL_TABLET | Freq: Every day | ORAL | Status: DC
  Administered 2014-06-19: 1000 mg via ORAL
  Filled 2014-06-19: qty 1
  Filled 2014-06-19: qty 2
  Filled 2014-06-19: qty 6
  Filled 2014-06-19: qty 2

## 2014-06-19 NOTE — Progress Notes (Signed)
D: Jordan Hanson has been pleasant today and compliant with meds/groups. She denies SI/HI/AVH. She complained of a HA and later nausea. She said she intends to "rest and just take day by day" and not "over think anything" and "ask for help if needed. On her self-inventory, she reported some lightheadedness and dizziness. She also rated as zero her feelings of depression, anxiety, and hopelessness. A: Orders carried out. Q15 safety checks. Tylenol given for headache with positive results. Pt said her nausea would resolve with rest. Ginger Ale given. Emotional support offered.  R: Pt remains free from harm and proceeds with treatment. Will continue to monitor for needs/safety.

## 2014-06-19 NOTE — [facility] Group Notes (Signed)
[facility] Group Notes:  (Nursing/Healthy Coping Skills)  Date:  06/19/2014  Time:  0930  Type of Therapy:  Psychoeducational Skills  Participation Level:  Active  Participation Quality:  Appropriate, Attentive, Sharing and Supportive  Affect:  Appropriate  Cognitive:  Alert, Appropriate and Oriented  Insight:  Appropriate and Good  Engagement in Group:  Engaged and Supportive  Modes of Intervention:  Discussion, Education, Exploration and Support  Summary of Progress/Problems: Pt was verbally interactive and engaged with peers during group session.   Ouida Sills, [place] Maxin 06/19/2014, 0930

## 2014-06-19 NOTE — Progress Notes (Signed)
[facility] MD Progress Note  06/19/2014 5:25 PM Jordan Hanson  MRN:  [id number]   Subjective:  Patient states that she feels better today.  "I here cause of self injurious behavior.  I was having some issues; I just got over whelmed cause I had a lot on my plate.  I hadn't cut for 5 years and I did yesterday.  I guess I just had a break."  Patient states that she used a razor blade to cut her wrist.  Patient has multiple laceration inner wrist.  Scabbed over with no s/s of infection noted.  At this time patient denise suicidal/homicidal ideation, psychosis, and paranoia. Patient states that she is tolerating medications without adverse effect, attending/participating in group sessions.  Principal Problem: MDD (major depressive disorder), recurrent severe, without psychosis Diagnosis:   Patient Active Problem List   Diagnosis Date Noted  . MDD (major depressive disorder), recurrent severe, without psychosis [F33.2] 06/19/2014  . MDD (major depressive disorder) [F32.2] 06/17/2014   Total Time spent with patient: 45 minutes   Past Medical History:  Past Medical History  Diagnosis Date  . Bipolar disorder   . ADHD (attention deficit hyperactivity disorder)   . Depression     Past Surgical History  Procedure Laterality Date  . Tonsillectomy     Family History: History reviewed. No pertinent family history. Social History:  History  Alcohol Use  . Yes    Comment: Pt drank tonight but states that she never drinks     History  Drug Use No    History   Social History  . Marital Status: Single    Spouse Name: N/A  . Number of Children: N/A  . Years of Education: N/A   Social History Main Topics  . Smoking status: Never Smoker   . Smokeless tobacco: Not on file  . Alcohol Use: Yes     Comment: Pt drank tonight but states that she never drinks  . Drug Use: No  . Sexual Activity: Not on file   Other Topics Concern  . None   Social History Narrative   Additional History:     Sleep: Good  Appetite:  Good   Assessment:   Musculoskeletal: Strength & Muscle Tone: within normal limits Gait & Station: normal Patient leans: N/A   Psychiatric Specialty Exam: Physical Exam  Constitutional: She is oriented to person, place, and time.  Neck: Normal range of motion.  Respiratory: Effort normal.  Musculoskeletal: Normal range of motion.  Neurological: She is alert and oriented to person, place, and time.  Skin:  Patient has multiple superficial cuts to her wrist.  Scabbed over with no s/s of infection noted.      Review of Systems  Musculoskeletal:       Self inflicted lacerations inner wrist.  Denies suicide attempt   Psychiatric/Behavioral: Positive for depression. Suicidal ideas: Denies. Hallucinations: Denies. Nervous/anxious: Denies. Insomnia: Denies.     Blood pressure 112/77, pulse 113, temperature 98 F (36.7 C), temperature source Oral, resp. rate 16, height 5' 3.78" (1.62 m), weight 95.709 kg (211 lb), last menstrual period 04/29/2014.Body mass index is 36.47 kg/(m^2).  General Appearance: Casual  Eye Contact::  Good  Speech:  Clear and Coherent and Normal Rate  Volume:  Normal  Mood:  Depressed  Affect:  Depressed  Thought Process:  Linear  Orientation:  Full (Time, Place, and Person)  Thought Content:  Denies hallucinations, psychosis, and paranoia  Suicidal Thoughts:  Denies at this time; states that she  was only cutting  Homicidal Thoughts:  No  Memory:  Immediate;   Good Recent;   Good Remote;   Good  Judgement:  Fair  Insight:  Fair  Psychomotor Activity:  Normal  Concentration:  Fair  Recall:  Good  Fund of Knowledge:Good  Language: Good  Akathisia:  No  Handed:  Right  AIMS (if indicated):     Assets:  Communication Skills Desire for Improvement Housing Social Support  ADL's:  Intact  Cognition: WNL  Sleep:  Number of Hours: 6     Current Medications: Current Facility-Administered Medications  Medication Dose  Route Frequency Provider Last Rate Last Dose  . acetaminophen (TYLENOL) tablet 650 mg  650 mg Oral Q6H PRN Kizzie Fantasia, NP   650 mg at 06/19/14 1033  . alum & mag hydroxide-simeth (MAALOX/MYLANTA) 200-200-20 MG/5ML suspension 30 mL  30 mL Oral Q4H PRN Evanna Burkett, NP      . divalproex (DEPAKOTE ER) 24 hr tablet 1,000 mg  1,000 mg Oral QHS Saramma Eappen, MD      . feeding supplement (RESOURCE BREEZE) (RESOURCE BREEZE) liquid 1 Container  1 Container Oral BID BM Renie Ora, RD   1 Container at 06/19/14 1031  . loperamide (IMODIUM) capsule 2 mg  2 mg Oral PRN Kristeen Mans, NP   2 mg at 06/18/14 2308  . magnesium hydroxide (MILK OF MAGNESIA) suspension 30 mL  30 mL Oral Daily PRN Evanna Burkett, NP      . QUEtiapine (SEROQUEL) tablet 300 mg  300 mg Oral Q2000 Evanna Burkett, NP   300 mg at 06/18/14 2136  . sertraline (ZOLOFT) tablet 100 mg  100 mg Oral Daily Evanna Burkett, NP   100 mg at 06/18/14 2136  . traZODone (DESYREL) tablet 50 mg  50 mg Oral QHS PRN,MR X 1 Evanna Burkett, NP        Lab Results:  Results for orders placed or performed during the hospital encounter of 06/17/14 (from the past 48 hour(s))  TSH     Status: None   Collection Time: 06/19/14  6:30 AM  Result Value Ref Range   TSH 3.601 0.350 - 4.500 uIU/mL    Comment: Performed at [facility]  Lipid panel     Status: Abnormal   Collection Time: 06/19/14  6:30 AM  Result Value Ref Range   Cholesterol 200 0 - 200 mg/dL   Triglycerides 045 (H) <150 mg/dL   HDL 39 (L) >40 mg/dL   Total CHOL/HDL Ratio 5.1 RATIO   VLDL 35 0 - 40 mg/dL   LDL Cholesterol 981 (H) 0 - 99 mg/dL    Comment:        Total Cholesterol/HDL:CHD Risk Coronary Heart Disease Risk Table                     Men   Women  1/2 Average Risk   3.4   3.3  Average Risk       5.0   4.4  2 X Average Risk   9.6   7.1  3 X Average Risk  23.4   11.0        Use the calculated Patient Ratio above and the CHD Risk Table to determine  the patient's CHD Risk.        ATP III CLASSIFICATION (LDL):  <100     mg/dL   Optimal  191-478  mg/dL   Near or Above  Optimal  130-159  mg/dL   Borderline  119-417  mg/dL   High  >408     mg/dL   Very High Performed at [facility]   Valproic acid level     Status: Abnormal   Collection Time: 06/19/14  6:30 AM  Result Value Ref Range   Valproic Acid Lvl 46 (L) 50.0 - 100.0 ug/mL    Comment: Performed at [facility]  Hepatic function panel     Status: Abnormal   Collection Time: 06/19/14  6:30 AM  Result Value Ref Range   Total Protein 7.1 6.5 - 8.1 g/dL   Albumin 3.3 (L) 3.5 - 5.0 g/dL   AST 25 15 - 41 U/L   ALT 30 14 - 54 U/L   Alkaline Phosphatase 55 38 - 126 U/L   Total Bilirubin 0.3 0.3 - 1.2 mg/dL   Bilirubin, Direct <1.4 (L) 0.1 - 0.5 mg/dL   Indirect Bilirubin NOT CALCULATED 0.3 - 0.9 mg/dL    Comment: Performed at [facility]    Physical Findings: AIMS: Facial and Oral Movements Muscles of Facial Expression: None, normal Lips and Perioral Area: None, normal Jaw: None, normal Tongue: None, normal,Extremity Movements Upper (arms, wrists, hands, fingers): None, normal Lower (legs, knees, ankles, toes): None, normal, Trunk Movements Neck, shoulders, hips: None, normal, Overall Severity Severity of abnormal movements (highest score from questions above): None, normal Incapacitation due to abnormal movements: None, normal Patient's awareness of abnormal movements (rate only patient's report): No Awareness, Dental Status Current problems with teeth and/or dentures?: No Does patient usually wear dentures?: No  CIWA:    COWS:     Treatment Plan Summary: Daily contact with patient to assess and evaluate symptoms and progress in treatment and Medication management  1. Admit for crisis management and stabilization 2. Medication management to reduce current symptoms to bale line and improve the patient's  overall level of functioning:  Started home medications (Zoloft 100 mg daily, Depakote R 250 mg Q hs, and Seroquel 300 mg Q hs).   3. Treat health problems as indicated 4. Develop treatment plan to decrease risk of relapse upon discharge and the need for readmission. 5. Psycho-social education regarding relapse prevention and self care. 6. Health care follow up as needed for medical problems 7. Restart home medications where appropriate.    Will continue with current treatment plan.  No changes at this time.  Medical Decision Making:  Established Problem, Stable/Improving (1), Review of Psycho-Social Stressors (1), Review or order clinical lab tests (1), Review of Last Therapy Session (1), Independent Review of image, tracing or specimen (2) and Review of Medication Regimen & Side Effects (2)   Rankin, Shuvon, FNP-BC 06/19/2014, 5:25 PM

## 2014-06-19 NOTE — Progress Notes (Signed)
[facility] Group Notes:  (Nursing/MHT/Case Management/Adjunct)  Date:  06/19/2014  Time:  11:37 PM  Type of Therapy:  Psychoeducational Skills  Participation Level:  Active  Participation Quality:  Appropriate  Affect:  Appropriate  Cognitive:  Appropriate  Insight:  Appropriate  Engagement in Group:  Engaged  Modes of Intervention:  Education  Summary of Progress/Problems: The patient stated in group that she had both a positive as well as a negative day. She stated that she "felt better emotionally" and that her family visit went well. On the other hand, she did complain of feeling nauseated at times. The patient anticipates being discharged tomorrow. In terms of the theme for the day, her coping skills will include: driving around, running, and taking naps.   Jordan Hanson 06/19/2014, 11:37 PM

## 2014-06-19 NOTE — [facility] Group Notes (Signed)
[facility] Group Notes:  (Clinical Social Work)  06/19/2014     1:15-2:15PM  Summary of Progress/Problems:   The main focus of today's process group was to discuss how unhealthy coping skills are learned and maintained, as well as to identify healthy coping skills we could plan to develop to take their place.  Motivational Interviewing was utilized to help patients explore in depth the perceived benefits and costs of unhealthy coping techniques, as well as the  benefits and costs of replacing that with a healthy coping skills.   The patient was late to group, but was attentive and interacted well with other patients.  Type of Therapy:  Group Therapy - Process   Participation Level:  Active  Participation Quality:  Attentive  Affect:  Appropriate  Cognitive:  Alert  Insight:  Engaged  Engagement in Therapy:  Engaged  Modes of Intervention:  Education, Motivational Interviewing  Ambrose Mantle, LCSW 06/19/2014, 12:57 PM

## 2014-06-20 MED ORDER — TRAZODONE HCL 50 MG PO TABS: 50.0000 mg | ORAL_TABLET | Freq: Every evening | ORAL | Status: DC | PRN

## 2014-06-20 MED ORDER — QUETIAPINE FUMARATE 300 MG PO TABS: 300.0000 mg | ORAL_TABLET | Freq: Every day | ORAL | Status: DC

## 2014-06-20 MED ORDER — IBUPROFEN 200 MG PO TABS: 400.0000 mg | ORAL_TABLET | Freq: Four times a day (QID) | ORAL | Status: DC | PRN

## 2014-06-20 MED ORDER — SERTRALINE HCL 100 MG PO TABS: 100.0000 mg | ORAL_TABLET | Freq: Every day | ORAL | Status: DC

## 2014-06-20 MED ORDER — DIVALPROEX SODIUM ER 500 MG PO TB24: 1000.0000 mg | ORAL_TABLET | Freq: Every day | ORAL | Status: DC

## 2014-06-20 MED ORDER — TRAZODONE HCL 50 MG PO TABS
50.0000 mg | ORAL_TABLET | Freq: Every day | ORAL | Status: DC
  Filled 2014-06-20: qty 3

## 2014-06-20 NOTE — [facility] Suicide Risk Assessment (Signed)
[facility] Discharge Suicide Risk Assessment   Demographic Factors:  Caucasian  Total Time spent with patient: 30 minutes  Musculoskeletal: Strength & Muscle Tone: within normal limits Gait & Station: normal Patient leans: N/A  Psychiatric Specialty Exam: Physical Exam  Review of Systems  Psychiatric/Behavioral: Positive for depression (IMPROVED).  All other systems reviewed and are negative.   Blood pressure 114/64, pulse 124, temperature 98.6 F (37 C), temperature source Oral, resp. rate 18, height 5' 3.78" (1.62 m), weight 95.709 kg (211 lb), last menstrual period 04/29/2014.Body mass index is 36.47 kg/(m^2).  General Appearance: Casual  Eye Contact::  Fair  Speech:  Clear and Coherent409  Volume:  Normal  Mood:  Euthymic  Affect:  Appropriate  Thought Process:  Coherent  Orientation:  Full (Time, Place, and Person)  Thought Content:  WDL  Suicidal Thoughts:  No  Homicidal Thoughts:  No  Memory:  Immediate;   Fair Recent;   Fair Remote;   Fair  Judgement:  Fair  Insight:  Fair  Psychomotor Activity:  Normal  Concentration:  Fair  Recall:  [employer] of Knowledge:Fair  Language: Fair  Akathisia:  No  Handed:  Right  AIMS (if indicated):     Assets:  Communication Skills Desire for Improvement  Sleep:  Number of Hours: 6  Cognition: WNL  ADL's:  Intact   Have you used any form of tobacco in the last 30 days? (Cigarettes, Smokeless Tobacco, Cigars, and/or Pipes): No  Has this patient used any form of tobacco in the last 30 days? (Cigarettes, Smokeless Tobacco, Cigars, and/or Pipes) No  Mental Status Per Nursing Assessment::   On Admission:  Self-harm thoughts, Self-harm behaviors  Current Mental Status by Physician: PT DENIES SI/HI/AH/VH  Loss Factors: NA  Historical Factors: Impulsivity  Risk Reduction Factors:   Positive therapeutic relationship  Continued Clinical Symptoms:  Previous Psychiatric Diagnoses and Treatments  Cognitive Features That  Contribute To Risk:  Polarized thinking    Suicide Risk:  Minimal: No identifiable suicidal ideation.  Patients presenting with no risk factors but with morbid ruminations; may be classified as minimal risk based on the severity of the depressive symptoms  Principal Problem: MDD (major depressive disorder), recurrent severe, without psychosis Discharge Diagnoses:  Patient Active Problem List   Diagnosis Date Noted  . MDD (major depressive disorder), recurrent severe, without psychosis [F33.2] 06/19/2014  . MDD (major depressive disorder) [F32.2] 06/17/2014    Follow-up Information    Follow up with [facility].   Why:  Medication management appointment on Thursday June 23rd 10:40 am with Tamela Oddi. Therapy appointment with Gunnar Fusi on Monday July 18th at 10 am. Please call office if you need to reschedule.    Contact information:   [facility] [address], [place], [place] [address] [phone]      Plan Of Care/Follow-up recommendations:  Activity:  NO RESTRICTIONS Diet:  REGULAR Tests:  DEPAKOTE LEVEL IN 5 DAYS - DISCUSSED WITH PATIENT Other:  FOLLOW UP WITH AFTER CARE AS SCHEDULED  Is patient on multiple antipsychotic therapies at discharge:  No   Has Patient had three or more failed trials of antipsychotic monotherapy by history:  No  Recommended Plan for Multiple Antipsychotic Therapies: NA    Jordan Mawhinney md 06/20/2014, 9:11 AM
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
## 2014-06-20 NOTE — Plan of Care (Signed)
Problem: Alteration in mood Goal: LTG-Patient reports reduction in suicidal thoughts (Patient reports reduction in suicidal thoughts and is able to verbalize a safety plan for whenever patient is feeling suicidal)  Outcome: Progressing Pt denied SI this shift and verbally contracted for safety.    Problem: Diagnosis: Increased Risk For Suicide Attempt Goal: STG-Patient Will Attend All Groups On The Unit Outcome: Progressing Pt attended evening group on 06/19/14. Goal: STG-Patient Will Comply With Medication Regime Outcome: Progressing Pt has been compliant with medications this shift.

## 2014-06-20 NOTE — Progress Notes (Signed)
D: Pt has appropriate affect and anxious mood.  Pt reports her goal was "to rest today and focus on Nhyla and I did that."  Pt reports she is "leaving tomorrow and I'm excited."  Pt reports she feels safe to discharge and states "I feel refreshed."  Pt denies SI/HI, denies hallucinations, denies pain.  Pt has been visible in milieu interacting with peers and staff appropriately.  Pt attended evening group.   A: Introduced self to pt.  Met with pt 1:1 and provided support and encouragement.  Actively listened to pt.  Pt provided with education handouts on scheduled night medications.  Medications administered per order.   R: Pt is compliant with medications.  Pt verbally contracts for safety.  Will continue to monitor and assess.

## 2014-06-20 NOTE — Progress Notes (Signed)
Discharge note: Patient discharged home per MD order.  Patient received all personal belongings, prescriptions and medication samples.  Discharge instructions reviewed with patient and she indicated understanding.  She denies SI/HI/AVH.  Patient left ambulatory with a friend.

## 2014-06-21 LAB — HEMOGLOBIN A1C
Hgb A1c MFr Bld: 5.7 % — ABNORMAL HIGH (ref 4.8–5.6)
Mean Plasma Glucose: 117 mg/dL

## 2014-07-18 ENCOUNTER — Encounter (HOSPITAL_COMMUNITY): Payer: Self-pay | Admitting: *Deleted

## 2014-07-18 ENCOUNTER — Emergency Department (HOSPITAL_COMMUNITY)
Admission: EM | Admit: 2014-07-18 | Discharge: 2014-07-19 | Disposition: A | Discharge status: Other Acute Inpatient Hospital | Payer: Medicaid Other | Attending: Emergency Medicine | Admitting: Emergency Medicine

## 2014-07-18 DIAGNOSIS — Z793 Long term (current) use of hormonal contraceptives: Secondary | ICD-10-CM | POA: Diagnosis not present

## 2014-07-18 DIAGNOSIS — Z880 Allergy status to penicillin: Secondary | ICD-10-CM | POA: Insufficient documentation

## 2014-07-18 DIAGNOSIS — R45851 Suicidal ideations: Secondary | ICD-10-CM

## 2014-07-18 DIAGNOSIS — Z3202 Encounter for pregnancy test, result negative: Secondary | ICD-10-CM | POA: Insufficient documentation

## 2014-07-18 DIAGNOSIS — F329 Major depressive disorder, single episode, unspecified: Secondary | ICD-10-CM | POA: Diagnosis not present

## 2014-07-18 DIAGNOSIS — Z79899 Other long term (current) drug therapy: Secondary | ICD-10-CM | POA: Diagnosis not present

## 2014-07-18 DIAGNOSIS — F332 Major depressive disorder, recurrent severe without psychotic features: Secondary | ICD-10-CM | POA: Diagnosis present

## 2014-07-18 DIAGNOSIS — F32A Depression, unspecified: Secondary | ICD-10-CM

## 2014-07-18 NOTE — ED Notes (Signed)
Pt states that she overdosed on Trazadone last week as a suicide attempt

## 2014-07-18 NOTE — ED Notes (Signed)
Pt states that she is depressed and has had thoughts of hurting herself; pt states that it has been going on awhile but has been getting worse; pt states that she has battled cutting herself for 14hrs

## 2014-07-19 ENCOUNTER — Inpatient Hospital Stay (HOSPITAL_COMMUNITY)
Admission: AD | Admit: 2014-07-19 | Discharge: 2014-07-21 | DRG: 885 | Disposition: A | Discharge status: Home / Self Care | Payer: Medicaid Other | Source: Intra-hospital | Attending: Emergency Medicine | Admitting: Emergency Medicine

## 2014-07-19 ENCOUNTER — Emergency Department (HOSPITAL_COMMUNITY)
Admission: EM | Admit: 2014-07-19 | Discharge: 2014-07-19 | Discharge status: Absent without leave | Payer: No Typology Code available for payment source

## 2014-07-19 ENCOUNTER — Encounter (HOSPITAL_COMMUNITY): Payer: Self-pay | Admitting: General Practice

## 2014-07-19 DIAGNOSIS — F1910 Other psychoactive substance abuse, uncomplicated: Secondary | ICD-10-CM | POA: Diagnosis not present

## 2014-07-19 DIAGNOSIS — F332 Major depressive disorder, recurrent severe without psychotic features: Secondary | ICD-10-CM

## 2014-07-19 DIAGNOSIS — R1031 Right lower quadrant pain: Secondary | ICD-10-CM | POA: Diagnosis present

## 2014-07-19 DIAGNOSIS — R45851 Suicidal ideations: Secondary | ICD-10-CM | POA: Diagnosis present

## 2014-07-19 DIAGNOSIS — F329 Major depressive disorder, single episode, unspecified: Secondary | ICD-10-CM | POA: Diagnosis not present

## 2014-07-19 DIAGNOSIS — F909 Attention-deficit hyperactivity disorder, unspecified type: Secondary | ICD-10-CM | POA: Diagnosis present

## 2014-07-19 LAB — VALPROIC ACID LEVEL: Valproic Acid Lvl: <10 ug/mL — ABNORMAL LOW (ref 50.0–100.0)

## 2014-07-19 LAB — CBC
HCT: 40.5 % (ref 36.0–46.0)
Hemoglobin: 13.2 g/dL (ref 12.0–15.0)
MCH: 27.4 pg (ref 26.0–34.0)
MCHC: 32.6 g/dL (ref 30.0–36.0)
MCV: 84.2 fL (ref 78.0–100.0)
PLATELETS: 340 10*3/uL (ref 150–400)
RBC: 4.81 MIL/uL (ref 3.87–5.11)
RDW: 12.7 % (ref 11.5–15.5)
WBC: 9.5 10*3/uL (ref 4.0–10.5)

## 2014-07-19 LAB — COMPREHENSIVE METABOLIC PANEL
ALBUMIN: 3.7 g/dL (ref 3.5–5.0)
ALK PHOS: 52 U/L (ref 38–126)
ALT: 23 U/L (ref 14–54)
AST: 20 U/L (ref 15–41)
Anion gap: 8 (ref 5–15)
BUN: 11 mg/dL (ref 6–20)
CALCIUM: 9.5 mg/dL (ref 8.9–10.3)
CO2: 25 mmol/L (ref 22–32)
Chloride: 106 mmol/L (ref 101–111)
Creatinine, Ser: 0.57 mg/dL (ref 0.44–1.00)
GFR calc Af Amer: >60 mL/min (ref >60)
GFR calc non Af Amer: >60 mL/min (ref >60)
Glucose, Bld: 98 mg/dL (ref 65–99)
POTASSIUM: 3.9 mmol/L (ref 3.5–5.1)
Sodium: 139 mmol/L (ref 135–145)
Total Bilirubin: 0.3 mg/dL (ref 0.3–1.2)
Total Protein: 7.9 g/dL (ref 6.5–8.1)

## 2014-07-19 LAB — RAPID URINE DRUG SCREEN, HOSP PERFORMED
Amphetamines: NOT DETECTED
Barbiturates: NOT DETECTED
Benzodiazepines: NOT DETECTED
COCAINE: NOT DETECTED
Opiates: NOT DETECTED
TETRAHYDROCANNABINOL: NOT DETECTED

## 2014-07-19 LAB — ETHANOL: Alcohol, Ethyl (B): <5 mg/dL (ref <5)

## 2014-07-19 LAB — ACETAMINOPHEN LEVEL

## 2014-07-19 LAB — TSH: TSH: 3.758 u[IU]/mL (ref 0.350–4.500)

## 2014-07-19 LAB — POC URINE PREG, ED: PREG TEST UR: NEGATIVE

## 2014-07-19 LAB — SALICYLATE LEVEL: Salicylate Lvl: <4 mg/dL (ref 2.8–30.0)

## 2014-07-19 LAB — T4, FREE: FREE T4: 0.71 ng/dL (ref 0.61–1.12)

## 2014-07-19 MED ORDER — NORGESTIM-ETH ESTRAD TRIPHASIC 0.18/0.215/0.25 MG-35 MCG PO TABS
1.0000 | ORAL_TABLET | Freq: Every day | ORAL | Status: DC
  Administered 2014-07-19 – 2014-07-20 (×2): 1 via ORAL
  Filled 2014-07-19 (×4): qty 1

## 2014-07-19 MED ORDER — IBUPROFEN 200 MG PO TABS
600.0000 mg | ORAL_TABLET | Freq: Three times a day (TID) | ORAL | Status: DC | PRN
  Administered 2014-07-19: 600 mg via ORAL
  Filled 2014-07-19: qty 3

## 2014-07-19 MED ORDER — NAPROXEN 375 MG PO TABS
375.0000 mg | ORAL_TABLET | Freq: Two times a day (BID) | ORAL | Status: DC | PRN
  Administered 2014-07-19: 375 mg via ORAL
  Filled 2014-07-19 (×2): qty 1
  Filled 2014-07-19: qty 4

## 2014-07-19 MED ORDER — TRAZODONE HCL 50 MG PO TABS
50.0000 mg | ORAL_TABLET | Freq: Every evening | ORAL | Status: DC | PRN
  Filled 2014-07-19: qty 1

## 2014-07-19 MED ORDER — NORGESTIM-ETH ESTRAD TRIPHASIC 0.18/0.215/0.25 MG-35 MCG PO TABS
1.0000 | ORAL_TABLET | Freq: Every day | ORAL | Status: DC
  Filled 2014-07-19: qty 1

## 2014-07-19 MED ORDER — PNEUMOCOCCAL VAC POLYVALENT 25 MCG/0.5ML IJ INJ
0.5000 mL | INJECTION | INTRAMUSCULAR | Status: AC
  Administered 2014-07-20: 0.5 mL via INTRAMUSCULAR
  Filled 2014-07-19: qty 0.5

## 2014-07-19 MED ORDER — LORAZEPAM 1 MG PO TABS
1.0000 mg | ORAL_TABLET | Freq: Three times a day (TID) | ORAL | Status: DC | PRN
  Administered 2014-07-19: 1 mg via ORAL
  Filled 2014-07-19: qty 1

## 2014-07-19 MED ORDER — ALUM & MAG HYDROXIDE-SIMETH 200-200-20 MG/5ML PO SUSP
30.0000 mL | ORAL | Status: DC | PRN
  Administered 2014-07-20: 30 mL via ORAL
  Filled 2014-07-19 (×2): qty 30

## 2014-07-19 MED ORDER — ONDANSETRON HCL 4 MG PO TABS: 4.0000 mg | ORAL_TABLET | Freq: Three times a day (TID) | ORAL | Status: DC | PRN

## 2014-07-19 MED ORDER — ACETAMINOPHEN 325 MG PO TABS
650.0000 mg | ORAL_TABLET | ORAL | Status: DC | PRN
  Administered 2014-07-19: 650 mg via ORAL
  Filled 2014-07-19: qty 2

## 2014-07-19 MED ORDER — ZOLPIDEM TARTRATE 5 MG PO TABS
5.0000 mg | ORAL_TABLET | Freq: Every evening | ORAL | Status: DC | PRN
  Administered 2014-07-19: 5 mg via ORAL
  Filled 2014-07-19: qty 1

## 2014-07-19 MED ORDER — MAGNESIUM HYDROXIDE 400 MG/5ML PO SUSP
30.0000 mL | Freq: Every day | ORAL | Status: DC | PRN
  Administered 2014-07-20: 30 mL via ORAL
  Filled 2014-07-19 (×2): qty 30
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
## 2014-07-19 NOTE — Progress Notes (Signed)
Patient ID: Jordan HalterKatia B Hanson, female   DOB: Jul 10, 1995, 19 y.o.   MRN: [id number]  Meade MawKatia is a 19 year old [facility] after endorsing suicidal ideation with plan to cut herself. [facility]. She has a history of cutting and per skin assessment patient has superficial scars to forearms and thighs. Patient has a past medical history of migraines, Bipolar disorder, ADHD, and depression. Patient reports she believes she has hyperthyroidism due to high pulse, rapid weightloss, and hypertension. Patient denies SI/HI and A/V hallucinations at time of admission. Patient verbalized understanding of the admission process and was oriented to the unit. Patient reported she has abdominal pain that at times radiates to her right flank. Patient denies any difficulty urinating, abnormal colored urine, or pain/frequency while urinating. Patient received PRN pain medication and a heat pack which patient reported minimal relief. Q15 minute safety checks were initiated and are maintained.

## 2014-07-19 NOTE — BH Assessment (Signed)
[facility] Assessment Progress Note  Per Thedore MinsMojeed Akintayo, MD, this pt requires psychiatric hospitalization at this time.  Berneice Heinrichina Tate, RN, [facility].  Pt has signed Voluntary Admission and Consent for Treatment, as well as Consent to Release Information to [facility], and signed [facility].  Pt's nurse has been notified, and agrees to send original paperwork along with pt via [place], and to call report to [phone].  Doylene Canninghomas Christeen Lai, MA Triage Specialist [phone]

## 2014-07-19 NOTE — Tx Team (Signed)
Initial Interdisciplinary Treatment Plan   PATIENT STRESSORS: Financial difficulties Health problems   PATIENT STRENGTHS: Wellsite geologistCommunication skills General fund of knowledge   PROBLEM LIST: Problem List/Patient Goals Date to be addressed Date deferred Reason deferred Estimated date of resolution  Depression 07/19/2014           Risk for suicide 07/19/2014           "Reminding myself it gets better" 07/19/2014           "Asking for help if I need it." 07/19/2014                  DISCHARGE CRITERIA:  Improved stabilization in mood, thinking, and/or behavior Motivation to continue treatment in a less acute level of care Verbal commitment to aftercare and medication compliance  PRELIMINARY DISCHARGE PLAN: Attend PHP/IOP Outpatient therapy  PATIENT/FAMIILY INVOLVEMENT: This treatment plan has been presented to and reviewed with the patient, Jordan Hanson.  The patient and family have been given the opportunity to ask questions and make suggestions.  Marzetta BoardDopson, Damiano Stamper E 07/19/2014, 4:53 PM

## 2014-07-19 NOTE — Progress Notes (Signed)
Charge RN, Victorino DikeJennifer contacted in regards to pt's transfer to r/o appendicitis, ovarian cyst rupture, etc.

## 2014-07-19 NOTE — Progress Notes (Signed)
D: Pt is currently denying any SI/HI/AVH. Pt presents anxious in affect and pleasant in mood. Pt is requesting medication management during this admission. Pt is visible and active within the milieu. [facility]. Pt reports to be doing much better [facility].  A: Continued support and availability as needed was extended to this pt. Staff continue to monitor pt with q7315min checks.  R: No adverse drug reactions noted. Pt receptive to treatment. Pt remains safe at this time.

## 2014-07-19 NOTE — BH Assessment (Signed)
Received notification of TTS consult request. Attempted to contact Santiago GladHeather Laisure, PA-C but she her shift had ened . Tele-assessment will be initiated.  Harlin RainFord Ellis Patsy BaltimoreWarrick Jr, LPC, [facility], [facility] Triage Specialist [phone]
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
## 2014-07-19 NOTE — ED Notes (Addendum)
[facility].  Pt reports RLQ pain since yesterday.  Pt also reports nausea denies any vomiting at this time.  Pt also reports decreased appetite.  [facility]

## 2014-07-19 NOTE — ED Provider Notes (Signed)
CSN: [id number]     Arrival date & time 07/18/14  2337 History   First MD Initiated Contact with Patient 07/18/14 2350     Chief Complaint  Patient presents with  . Suicidal     (Consider location/radiation/quality/duration/timing/severity/associated sxs/prior Treatment) HPI Comments: Patient with a history of Depression and Bipolar presents today with depression.  She states that she has been increasingly depressed over the past several weeks. She reports that that she has been having thoughts of hurting herself.  She has been having thoughts of cutting herself, but has not actually done it.  She reports a suicide attempt last week by taking ten Trazadone.  She states that she does not want to kill herself at this time.  She denies HI.  Denies alcohol or recreational drug use.  Denies physical complaints.  She is followed by a Psychiatrist, but reports she does not have an appointment for another month.    The history is provided by the patient.    Past Medical History  Diagnosis Date  . Bipolar disorder   . ADHD (attention deficit hyperactivity disorder)   . Depression    Past Surgical History  Procedure Laterality Date  . Tonsillectomy     No family history on file. History  Substance Use Topics  . Smoking status: Never Smoker   . Smokeless tobacco: Not on file  . Alcohol Use: Yes     Comment: occ   OB History    No data available     Review of Systems  All other systems reviewed and are negative.     Allergies  Penicillins  Home Medications   Prior to Admission medications   Medication Sig Start Date End Date Taking? Authorizing Provider  divalproex (DEPAKOTE ER) 500 MG 24 hr tablet Take 2 tablets (1,000 mg total) by mouth at bedtime. For mood stabilization 06/20/14  Yes Shuvon B Rankin, NP  ibuprofen (ADVIL,MOTRIN) 200 MG tablet Take 2 tablets (400 mg total) by mouth every 6 (six) hours as needed. For pain 06/20/14  Yes Shuvon B Rankin, NP  MONONESSA 0.25-35  MG-MCG tablet Take 1 tablet by mouth daily at 8 pm. 05/18/14  Yes Historical Provider, MD  QUEtiapine (SEROQUEL) 300 MG tablet Take 1 tablet (300 mg total) by mouth daily at 8 pm. For mood stabilization 06/20/14  Yes Shuvon B Rankin, NP  sertraline (ZOLOFT) 100 MG tablet Take 1 tablet (100 mg total) by mouth daily. For depression 06/20/14  Yes Shuvon B Rankin, NP  traZODone (DESYREL) 50 MG tablet Take 1 tablet (50 mg total) by mouth at bedtime as needed for sleep. 06/20/14  Yes Shuvon B Rankin, NP  Norgestimate-Ethinyl Estradiol Triphasic 0.18/0.215/0.25 MG-35 MCG tablet Take by mouth. Last night    Historical Provider, MD   BP 135/93 mmHg  Pulse 110  Temp(Src) 98 F (36.7 C) (Oral)  Resp 20  SpO2 98%  LMP 07/11/2014 Physical Exam  Constitutional: She appears well-developed and well-nourished.  HENT:  Head: Normocephalic and atraumatic.  Neck: Normal range of motion. Neck supple.  Cardiovascular: Normal rate, regular rhythm and normal heart sounds.   Pulmonary/Chest: Effort normal and breath sounds normal.  Musculoskeletal: Normal range of motion.  Neurological: She is alert.  Skin: Skin is warm and dry.  Psychiatric: She has a normal mood and affect. Thought content is not paranoid and not delusional. She expresses suicidal ideation. She expresses no homicidal ideation. She expresses suicidal plans. She expresses no homicidal plans.  Nursing note and vitals  reviewed.   ED Course  Procedures (including critical care time) Labs Review Labs Reviewed  ACETAMINOPHEN LEVEL  CBC  COMPREHENSIVE METABOLIC PANEL  ETHANOL  SALICYLATE LEVEL  URINE RAPID DRUG SCREEN, HOSP PERFORMED  POC URINE PREG, ED    Imaging Review No results found.   EKG Interpretation None      MDM   Final diagnoses:  None   Patient presents today with thoughts of hurting herself.  She denies wanting to kill herself, but reports thoughts of cutting.  She reports that she attempted suicide by taking ten  Trazodone last week.  VSS.  TTS consulted.  Dispo pending TTS recommendation.      Santiago GladHeather Delpha Perko, PA-C 07/19/14 [id number]  Purvis SheffieldForrest Harrison, MD 07/19/14 [phone]

## 2014-07-19 NOTE — Progress Notes (Signed)
Patient with c/o of progressive RLQ pain rated 9/10, sharp and intensifying  over the last 48-72 hours. The patient is also endorsing nausea with decreased appetite and radiation pain to Left flank. There is no reported Fever, chills, dysuria, vaginal d/c, hematuria, diarrhea or hx of GB/Biliary colic.  V/s: T 98.1, P 100 HR 77 138/84  Pulm; CTA  Cardio: Audible S1,S2 without M/G/R  Abd: soft exo obese, with normoactive BS, positive pain at Mcburney's point, Positive Psoas sign, No CVA tenderness appreciated.  A/P Acute RLQ Abd pain, r/o Appendicitis, Ovarian cyst rupture.  [facility] and mgmt.

## 2014-07-20 ENCOUNTER — Encounter (HOSPITAL_COMMUNITY): Payer: Self-pay

## 2014-07-20 ENCOUNTER — Inpatient Hospital Stay (HOSPITAL_COMMUNITY): Payer: Medicaid Other

## 2014-07-20 DIAGNOSIS — F332 Major depressive disorder, recurrent severe without psychotic features: Principal | ICD-10-CM

## 2014-07-20 LAB — URINALYSIS W MICROSCOPIC (NOT AT ARMC)
Bilirubin Urine: NEGATIVE
Glucose, UA: NEGATIVE mg/dL
KETONES UR: NEGATIVE mg/dL
Nitrite: NEGATIVE
PH: 7.5 (ref 5.0–8.0)
Protein, ur: NEGATIVE mg/dL
SPECIFIC GRAVITY, URINE: 1.026 (ref 1.005–1.030)
Urobilinogen, UA: 1 mg/dL (ref 0.0–1.0)

## 2014-07-20 LAB — CBC
HCT: 40.9 % (ref 36.0–46.0)
HEMOGLOBIN: 13.6 g/dL (ref 12.0–15.0)
MCH: 27.9 pg (ref 26.0–34.0)
MCHC: 33.3 g/dL (ref 30.0–36.0)
MCV: 84 fL (ref 78.0–100.0)
Platelets: 331 10*3/uL (ref 150–400)
RBC: 4.87 MIL/uL (ref 3.87–5.11)
RDW: 12.9 % (ref 11.5–15.5)
WBC: 10.2 10*3/uL (ref 4.0–10.5)

## 2014-07-20 LAB — COMPREHENSIVE METABOLIC PANEL
ALK PHOS: 58 U/L (ref 38–126)
ALT: 24 U/L (ref 14–54)
ANION GAP: 9 (ref 5–15)
AST: 24 U/L (ref 15–41)
Albumin: 3.9 g/dL (ref 3.5–5.0)
BILIRUBIN TOTAL: 0.5 mg/dL (ref 0.3–1.2)
BUN: 14 mg/dL (ref 6–20)
CO2: 23 mmol/L (ref 22–32)
Calcium: 9.6 mg/dL (ref 8.9–10.3)
Chloride: 107 mmol/L (ref 101–111)
Creatinine, Ser: 0.46 mg/dL (ref 0.44–1.00)
GFR calc non Af Amer: >60 mL/min (ref >60)
Glucose, Bld: 85 mg/dL (ref 65–99)
Potassium: 3.8 mmol/L (ref 3.5–5.1)
SODIUM: 139 mmol/L (ref 135–145)
Total Protein: 8.1 g/dL (ref 6.5–8.1)

## 2014-07-20 LAB — I-STAT BETA HCG BLOOD, ED (MC, WL, AP ONLY): I-stat hCG, quantitative: <5 m[IU]/mL (ref <5)

## 2014-07-20 MED ORDER — QUETIAPINE FUMARATE 300 MG PO TABS
300.0000 mg | ORAL_TABLET | Freq: Every day | ORAL | Status: DC
  Administered 2014-07-20: 300 mg via ORAL
  Filled 2014-07-20: qty 14
  Filled 2014-07-20 (×2): qty 1

## 2014-07-20 MED ORDER — SERTRALINE HCL 50 MG PO TABS
150.0000 mg | ORAL_TABLET | Freq: Every day | ORAL | Status: DC
Start: 2014-07-20 — End: 2014-07-20
  Filled 2014-07-20 (×2): qty 3

## 2014-07-20 MED ORDER — BOOST / RESOURCE BREEZE PO LIQD
1.0000 | Freq: Two times a day (BID) | ORAL | Status: DC
  Filled 2014-07-20 (×7): qty 1

## 2014-07-20 MED ORDER — IOHEXOL 300 MG/ML  SOLN
50.0000 mL | Freq: Once | INTRAMUSCULAR | Status: AC | PRN
  Administered 2014-07-20: 50 mL via ORAL

## 2014-07-20 MED ORDER — HYDROXYZINE HCL 25 MG PO TABS: 25.0000 mg | ORAL_TABLET | Freq: Four times a day (QID) | ORAL | Status: DC | PRN

## 2014-07-20 MED ORDER — IOHEXOL 300 MG/ML  SOLN
100.0000 mL | Freq: Once | INTRAMUSCULAR | Status: AC | PRN
  Administered 2014-07-20: 100 mL via INTRAVENOUS

## 2014-07-20 MED ORDER — SERTRALINE HCL 50 MG PO TABS
150.0000 mg | ORAL_TABLET | Freq: Every day | ORAL | Status: DC
  Administered 2014-07-20: 150 mg via ORAL
  Filled 2014-07-20: qty 3
  Filled 2014-07-20: qty 9
  Filled 2014-07-20: qty 3

## 2014-07-20 MED ORDER — DIVALPROEX SODIUM ER 500 MG PO TB24
1000.0000 mg | ORAL_TABLET | Freq: Every day | ORAL | Status: DC
  Administered 2014-07-20: 1000 mg via ORAL
  Filled 2014-07-20: qty 6
  Filled 2014-07-20 (×2): qty 2
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
## 2014-07-20 NOTE — Progress Notes (Signed)
The focus of this group is to help patients review their daily goal of treatment and discuss progress on daily workbooks. Pt attended the evening group session and responded to all discussion prompts from the Writer. Pt shared that today was a good day on the unit, the highlight of which was feeling better after her pain last evening. Meade MawKatia said that going to the bathroom made the pain go away, but also that her appetite had not fully returned. Pt reported having no additional needs from Nursing Staff this evening. Meade MawKatia was observed smiling and using humor to interact with her peers during and after group. Pt's affect was appropriate.

## 2014-07-20 NOTE — [facility] Suicide Risk Assessment (Signed)
[facility] INPATIENT:  Family/Significant Other Suicide Prevention Education  Suicide Prevention Education:  Patient Refusal for Family/Significant Other Suicide Prevention Education: The patient Jordan Hanson has refused to provide written consent for family/significant other to be provided Family/Significant Other Suicide Prevention Education during admission and/or prior to discharge.  Physician notified.  Elaina HoopsCarter, Dail Meece M 07/20/2014, 11:45 AM

## 2014-07-20 NOTE — Progress Notes (Signed)
NUTRITION ASSESSMENT  Pt identified as at risk on the Malnutrition Screen Tool  INTERVENTION: 1. Educated patient on the importance of nutrition and encouraged intake of food and beverages. 2. Discussed weight goals. 3. Supplements: will order Resource Breeze TID, each supplement provides 250 kcal and 9 grams of protein  NUTRITION DIAGNOSIS: Unintentional weight loss related to sub-optimal intake as evidenced by pt report.   Goal: Pt to meet >/= 90% of their estimated nutrition needs.  Monitor:  PO intake  Assessment:  Pt seen for MST. Pt with nausea and decreased appetite and intakes PTA. Per weight hx review, she has lost 7 lbs (3% body weight) in the past month which is not significant for time frame.  Will order Resource Breeze BID to supplement.  19 y.o. female  Height: Ht Readings from Last 1 Encounters:  07/19/14 5' 3.78" (1.62 m)    Weight: Wt Readings from Last 1 Encounters:  07/19/14 204 lb (92.534 kg)    Weight Hx: Wt Readings from Last 10 Encounters:  07/19/14 204 lb (92.534 kg)  06/17/14 211 lb (95.709 kg)  05/30/14 210 lb (95.255 kg)  02/10/14 220 lb (99.791 kg)  10/19/13 210 lb (95.255 kg)  09/09/13 210 lb (95.255 kg)  05/07/13 210 lb (95.255 kg)  03/12/13 210 lb (95.255 kg) (98 %*, Z = 2.08)  02/26/13 210 lb (95.255 kg) (98 %*, Z = 2.08)  11/25/12 210 lb (95.255 kg) (98 %*, Z = 2.08)   * Growth percentiles are based on CDC 2-20 Years data.    BMI:  Body mass index is 35.26 kg/(m^2). Pt meets criteria for obesity based on current BMI.  Estimated Nutritional Needs: Kcal: 25-30 kcal/kg Protein: > 1 gram protein/kg Fluid: 1 ml/kcal  Diet Order: Diet NPO time specified Pt is also offered choice of unit snacks mid-morning and mid-afternoon.  Pt is eating as desired.   Lab results and medications reviewed.      Trenton GammonJessica Tristain Daily, RD, LDN Inpatient Clinical Dietitian Pager # [phone] After hours/weekend pager # [phone]

## 2014-07-20 NOTE — [facility] Group Notes (Signed)
[facility] Group Notes:  (Nursing/MHT/Case Management/Adjunct)  Date:  07/20/2014  Time:  9:41 AM  Type of Therapy:  Nurse Education  Participation Level:  Did Not Attend  Participation Quality:  did not attend  Affect:  did not attend  Cognitive:  did not attend  Insight:  None  Engagement in Group:  None  Modes of Intervention:  Discussion and Education  Summary of Progress/Problems: The purpose of this group is to discuss the topic of the day which is Recovery. Patient's are also asked to state their goal for the day. Patient was invited to group but did not attend.    

## 2014-07-20 NOTE — Progress Notes (Signed)
Patient ID: Jordan HalterKatia B Falwell, female   DOB: 09-08-1995, 19 y.o.   MRN: [id number]  DAR: Pt. Denies SI/HI and A/V Hallucinations. Support and encouragement provided to the patient. Scheduled medications administered to patient per physician's orders however patient refused her nutritional supplement. Patient received Pneumonia vaccine in left deltoid without difficulty. She came to writer this afternoon and stated, "can I get something for gas I need to fart." She was given PRN Maalox however reported no change on reassessment. She denied pain. She is cooperative with staff and is seen in the milieu at times. Patient is minimal in interaction but is able to make her needs known. Q15 minute checks are maintained for safety.

## 2014-07-20 NOTE — ED Notes (Signed)
Patient transported to CT 

## 2014-07-20 NOTE — [facility] Suicide Risk Assessment (Signed)
[facility] Admission Suicide Risk Assessment   Nursing information obtained from:    Demographic factors:   19 year old single female, [facility] employed  Current Mental Status:    see below Loss Factors:   none acute  Historical Factors:   history of depression, history of self cutting  Risk Reduction Factors:   resilience, physical health, employment Total Time spent with patient: 45 minutes Principal Problem:  Major Depression, without Psychotic symptoms Diagnosis:   Patient Active Problem List   Diagnosis Date Noted  . Suicidal ideation [R45.851] 07/19/2014  . MDD (major depressive disorder), recurrent severe, without psychosis [F33.2] 06/19/2014  . MDD (major depressive disorder) [F32.2] 06/17/2014     Continued Clinical Symptoms:  Alcohol Use Disorder Identification Test Final Score (AUDIT): 4 The "Alcohol Use Disorders Identification Test", Guidelines for Use in Primary Care, Second Edition.  World Science writerHealth Organization [facility](WHO). Score between 0-7:  no or low risk or alcohol related problems. Score between 8-15:  moderate risk of alcohol related problems. Score between 16-19:  high risk of alcohol related problems. Score 20 or above:  warrants further diagnostic evaluation for alcohol dependence and treatment.   CLINICAL FACTORS:  19 year old female, known to our unit from recent admission in June 2016. History of depression, PTSD, borderline personality features . States that in general she has significantly improved over the years, with much improvement of PTSD symptoms, decrease in episodes of self cutting and improved ability to function / improved self esteem. She does continue to endorse brief episodes of depression and feeling overwhelmed, during which she at times feels vaguely suicidal . She came to ED due to such episode, states last cut herself several weeks ago.  She is currently presenting euthymic and denying any current significant depression. History of good  response and tolerance to combination of Zoloft, Seroquel, Depakote ER.     Musculoskeletal: Strength & Muscle Tone: within normal limits Gait & Station: normal Patient leans: N/A  Psychiatric Specialty Exam: Physical Exam  ROS  Blood pressure 113/56, pulse 115, temperature 98.7 F (37.1 C), temperature source Oral, resp. rate 16, height 5' 3.78" (1.62 m), weight 204 lb (92.534 kg), last menstrual period 07/11/2014, SpO2 100 %.Body mass index is 35.26 kg/(m^2).  See Admit Note MSE                                                        COGNITIVE FEATURES THAT CONTRIBUTE TO RISK:  Closed-mindedness    SUICIDE RISK:   Mild:  Suicidal ideation of limited frequency, intensity, duration, and specificity.  There are no identifiable plans, no associated intent, mild dysphoria and related symptoms, good self-control (both objective and subjective assessment), few other risk factors, and identifiable protective factors, including available and accessible social support.  PLAN OF CARE: Patient will be admitted to inpatient psychiatric unit for stabilization and safety. Will provide and encourage milieu participation. Provide medication management and maked adjustments as needed.  Will follow daily.    Medical Decision Making:  Review of Psycho-Social Stressors (1), Review or order clinical lab tests (1), Established Problem, Worsening (2) and Review of Medication Regimen & Side Effects (2)  I certify that inpatient services furnished can reasonably be expected to improve the patient's condition.   Stephanos Fan 07/20/2014, 8:54 AM

## 2014-07-20 NOTE — [facility] Group Notes (Signed)
[facility] LCSW Group Therapy 07/20/2014 1:15 PM  Type of Therapy: Group Therapy- Feelings about Diagnosis  Pt did not attend, declined invitation.   Chad CordialLauren Carter, LCSWA 07/20/2014 5:09 PM

## 2014-07-20 NOTE — Progress Notes (Signed)
Pt attended spiritual care group on grief and loss facilitated by chaplain Mckay Tegtmeyer. Group opened with brief discussion and psycho-social ed around grief and loss in relationships and in relation to self - identifying life patterns, circumstances, changes that cause losses. Established group norm of speaking from own life experience. Group goal of establishing open and affirming space for members to share loss and experience with grief, normalize grief experience and provide psycho social education and grief support.  Group drew on narrative and Adlerian therapeutic modalities.  

## 2014-07-20 NOTE — Progress Notes (Signed)
Recreation Therapy Notes  Animal-Assisted Activity (AAA) Program Checklist/Progress Notes Patient Eligibility Criteria Checklist & Daily Group note for Rec Tx Intervention  Date: 07.12.16 Time: 2:45 pm Location: [address] Dayroom   AAA/T Program Assumption of Risk Form signed by Patient/ or Parent Legal Guardian yes  Patient is free of allergies or sever asthma yes  Patient reports no fear of animals yes  Patient reports no history of cruelty to animalsyes  Patient understands his/her participation is voluntary yes  Patient washes hands before animal contact yes  Patient washes hands after animal contact yes  Education: Hand Washing, Appropriate Animal Interaction   Education Outcome: Acknowledges understanding/In group clarification offered/Needs additional education.   Clinical Observations/Feedback:  Patient did not attend group.   Crystalyn Delia, LRT/CTRS         Jordan Hanson 07/20/2014 4:00 PM 

## 2014-07-20 NOTE — Tx Team (Signed)
Interdisciplinary Treatment Plan Update (Adult) Date: 07/20/2014   Date: 07/20/2014 9:22 AM  Progress in Treatment:  Attending groups: Pt is new to milieu, continuing to assess  Participating in groups: Pt is new to milieu, continuing to assess  Taking medication as prescribed: Yes Tolerating medication: Yes  Family/Significant othe contact made: CSW assessing for appropriate contact Patient understands diagnosis: Yes Discussing patient identified problems/goals with staff: Yes  Medical problems stabilized or resolved: Yes  Denies suicidal/homicidal ideation: Yes Patient has not harmed self or Others: Yes   New problem(s) identified: None identified at this time.   Discharge Plan or Barriers: CSW will assess for appropriate discharge plan and relevant barriers.   Additional comments: n/a   Reason for Continuation of Hospitalization:  Anxiety Depression Medication stabilization Suicidal ideation  Estimated length of stay: 3-5 days  For review of initial/current patient goals, please see plan of care.   Attendees:  Patient:    Family:    Physician: Dr. Jama Flavorsobos, MD  07/20/2014 8:50 AM  Nursing: Onnie BoerJennifer Clark, RN Case manager  07/20/2014 8:50 AM  Clinical Social Worker Lamar SprinklesLauren Carter, LCSWA, MSW 07/20/2014 8:50 AM  Other: Leisa LenzValerie Enoch, Vesta MixerMonarch Liasion 07/20/2014 8:50 AM  Clinical:  Quintella ReichertBeverly Knight, Liborio NixonPatrice White, Sunday SpillersKrista-RN 07/20/2014 8:50 AM  Other: , RN Charge Nurse 07/20/2014 8:50 AM  Other:      Chad CordialLauren Carter, Theresia MajorsLCSWA MSW

## 2014-07-20 NOTE — ED Notes (Signed)
[facility] is [facility].

## 2014-07-20 NOTE — Plan of Care (Signed)
Problem: Diagnosis: Increased Risk For Suicide Attempt Goal: STG-Patient Will Report Suicidal Feelings to Staff Outcome: Progressing Patient denies thoughts of suicide or self harm at this time.

## 2014-07-20 NOTE — Progress Notes (Signed)
Patient ID: Suan HalterKatia B Pat, female   DOB: [birth year], 19 y.o.   MRN: [id number] PER STATE REGULATIONS 482.30  THIS CHART WAS REVIEWED FOR MEDICAL NECESSITY WITH RESPECT TO THE PATIENT'S ADMISSION/DURATION OF STAY.  NEXT REVIEW DATE: 07/23/14  Loura HaltBARBARA Roper Tolson, RN, BSN CASE MANAGER

## 2014-07-20 NOTE — ED Provider Notes (Signed)
CSN: [id number]     Arrival date & time 07/19/14  2316 History   First MD Initiated Contact with Patient 07/20/14 0056     Chief Complaint  Patient presents with  . Abdominal Pain     (Consider location/radiation/quality/duration/timing/severity/associated sxs/prior Treatment) Patient is a 19 y.o. female presenting with abdominal pain. The history is provided by the patient. No language interpreter was used.  Abdominal Pain Pain location:  RLQ Associated symptoms: no chest pain, no chills, no diarrhea, no dysuria, no fever, no nausea, no shortness of breath, no vaginal bleeding, no vaginal discharge and no vomiting   Associated symptoms comment:  RLQ abdominal pain over the last 24 hours, getting worse over time. No nausea or vomiting. No diarrhea or fever. She is less interested in eating. She is here for evaluation of appendicitis from [facility] where she is admitted for depression. She denies vaginal discharge, pelvic pain or vaginal bleeding. No history of ovarian cysts.   Past Medical History  Diagnosis Date  . Bipolar disorder   . ADHD (attention deficit hyperactivity disorder)   . Depression    Past Surgical History  Procedure Laterality Date  . Tonsillectomy     History reviewed. No pertinent family history. History  Substance Use Topics  . Smoking status: Never Smoker   . Smokeless tobacco: Not on file  . Alcohol Use: Yes     Comment: occ   OB History    No data available     Review of Systems  Constitutional: Negative for fever and chills.  Respiratory: Negative.  Negative for shortness of breath.   Cardiovascular: Negative.  Negative for chest pain.  Gastrointestinal: Positive for abdominal pain. Negative for nausea, vomiting and diarrhea.  Genitourinary: Negative for dysuria, vaginal bleeding, vaginal discharge and pelvic pain.  Musculoskeletal: Negative.   Skin: Negative.   Neurological: Negative.       Allergies  Penicillins  Home  Medications   Prior to Admission medications   Medication Sig Start Date End Date Taking? Authorizing Provider  divalproex (DEPAKOTE ER) 500 MG 24 hr tablet Take 2 tablets (1,000 mg total) by mouth at bedtime. For mood stabilization 06/20/14   Shuvon B Rankin, NP  ibuprofen (ADVIL,MOTRIN) 200 MG tablet Take 2 tablets (400 mg total) by mouth every 6 (six) hours as needed. For pain 06/20/14   Talmage Nap, NP  MONONESSA 0.25-35 MG-MCG tablet Take 1 tablet by mouth daily at 8 pm. 05/18/14   Historical Provider, MD  Norgestimate-Ethinyl Estradiol Triphasic 0.18/0.215/0.25 MG-35 MCG tablet Take by mouth. Last night    Historical Provider, MD  QUEtiapine (SEROQUEL) 300 MG tablet Take 1 tablet (300 mg total) by mouth daily at 8 pm. For mood stabilization 06/20/14   Shuvon B Rankin, NP  sertraline (ZOLOFT) 100 MG tablet Take 1 tablet (100 mg total) by mouth daily. For depression 06/20/14   Shuvon B Rankin, NP  traZODone (DESYREL) 50 MG tablet Take 1 tablet (50 mg total) by mouth at bedtime as needed for sleep. 06/20/14   Shuvon B Rankin, NP   BP 127/90 mmHg  Pulse 93  Temp(Src) 98.1 F (36.7 C) (Oral)  Resp 17  Ht 5' 3.78" (1.62 m)  Wt 204 lb (92.534 kg)  BMI 35.26 kg/m2  SpO2 100%  LMP 07/11/2014 Physical Exam  Constitutional: She is oriented to person, place, and time. She appears well-developed and well-nourished.  Neck: Normal range of motion.  Pulmonary/Chest: Effort normal.  Abdominal: Soft.  Tenderness limited to RLQ  abdomen. Mild rebound. No guarding.  Neurological: She is alert and oriented to person, place, and time.  Skin: Skin is warm and dry.    ED Course  Procedures (including critical care time) Labs Review Labs Reviewed  VALPROIC ACID LEVEL - Abnormal; Notable for the following:    Valproic Acid Lvl <10 (*)    All other components within normal limits  URINALYSIS W MICROSCOPIC - Abnormal; Notable for the following:    APPearance CLOUDY (*)    Hgb urine dipstick TRACE (*)     Leukocytes, UA SMALL (*)    Squamous Epithelial / LPF FEW (*)    All other components within normal limits  T4, FREE  TSH  COMPREHENSIVE METABOLIC PANEL  CBC  I-STAT BETA HCG BLOOD, ED ([facility], [facility], [facility] ONLY)   Results for orders placed or performed during the hospital encounter of 07/19/14  T4, free  Result Value Ref Range   Free T4 0.71 0.61 - 1.12 ng/dL  TSH  Result Value Ref Range   TSH 3.758 0.350 - 4.500 uIU/mL  Valproic acid level  Result Value Ref Range   Valproic Acid Lvl <10 (L) 50.0 - 100.0 ug/mL  Urinalysis with microscopic  Result Value Ref Range   Color, Urine YELLOW YELLOW   APPearance CLOUDY (A) CLEAR   Specific Gravity, Urine 1.026 1.005 - 1.030   pH 7.5 5.0 - 8.0   Glucose, UA NEGATIVE NEGATIVE mg/dL   Hgb urine dipstick TRACE (A) NEGATIVE   Bilirubin Urine NEGATIVE NEGATIVE   Ketones, ur NEGATIVE NEGATIVE mg/dL   Protein, ur NEGATIVE NEGATIVE mg/dL   Urobilinogen, UA 1.0 0.0 - 1.0 mg/dL   Nitrite NEGATIVE NEGATIVE   Leukocytes, UA SMALL (A) NEGATIVE   WBC, UA 11-20 <3 WBC/hpf   RBC / HPF 3-6 <3 RBC/hpf   Bacteria, UA RARE RARE   Squamous Epithelial / LPF FEW (A) RARE   Urine-Other AMORPHOUS URATES/PHOSPHATES   Comprehensive metabolic panel  Result Value Ref Range   Sodium 139 135 - 145 mmol/L   Potassium 3.8 3.5 - 5.1 mmol/L   Chloride 107 101 - 111 mmol/L   CO2 23 22 - 32 mmol/L   Glucose, Bld 85 65 - 99 mg/dL   BUN 14 6 - 20 mg/dL   Creatinine, Ser 1.61 0.44 - 1.00 mg/dL   Calcium 9.6 8.9 - 09.6 mg/dL   Total Protein 8.1 6.5 - 8.1 g/dL   Albumin 3.9 3.5 - 5.0 g/dL   AST 24 15 - 41 U/L   ALT 24 14 - 54 U/L   Alkaline Phosphatase 58 38 - 126 U/L   Total Bilirubin 0.5 0.3 - 1.2 mg/dL   GFR calc non Af Amer >60 >60 mL/min   GFR calc Af Amer >60 >60 mL/min   Anion gap 9 5 - 15  CBC  Result Value Ref Range   WBC 10.2 4.0 - 10.5 K/uL   RBC 4.87 3.87 - 5.11 MIL/uL   Hemoglobin 13.6 12.0 - 15.0 g/dL   HCT 04.5 40.9 - 81.1 %   MCV 84.0 78.0 -  100.0 fL   MCH 27.9 26.0 - 34.0 pg   MCHC 33.3 30.0 - 36.0 g/dL   RDW 91.4 78.2 - 95.6 %   Platelets 331 150 - 400 K/uL  I-Stat beta hCG blood, ED  Result Value Ref Range   I-stat hCG, quantitative <5.0 <5 mIU/mL   Comment 3           Ct Abdomen  Pelvis W Contrast  07/20/2014   CLINICAL DATA:  Right lower quadrant pain since yesterday. Nausea. Decreased appetite.  EXAM: CT ABDOMEN AND PELVIS WITH CONTRAST  TECHNIQUE: Multidetector CT imaging of the abdomen and pelvis was performed using the standard protocol following bolus administration of intravenous contrast.  CONTRAST:  100mL OMNIPAQUE IOHEXOL 300 MG/ML  SOLN  COMPARISON:  None.  FINDINGS: Lung bases are clear.  The liver, spleen, gallbladder, pancreas, adrenal glands, kidneys, abdominal aorta, inferior vena cava, and retroperitoneal lymph nodes are unremarkable. Stomach, small bowel, and colon are not abnormally distended. No wall thickening is appreciated. No free air or free fluid in the abdomen. Abdominal wall musculature appears intact. Scattered mesenteric lymph nodes are not pathologically enlarged and are likely reactive.  Pelvis: Appendix is normal. Uterus and ovaries are not enlarged. No pelvic mass or lymphadenopathy. No free or loculated pelvic fluid collections. Bladder wall is not thickened. With no destructive bone lesions.  IMPRESSION: No acute process demonstrated in the abdomen or pelvis. No evidence of bowel obstruction or inflammation.   Electronically Signed   By: Burman NievesWilliam  Stevens M.D.   On: 07/20/2014 03:09    Imaging Review No results found.   EKG Interpretation None      MDM   Final diagnoses:  None    1. Abdominal pain  The patient declines pelvic exam. CT shows no abnormalities in the pelvis - no cysts, free fluid or inflammation. Negative for appendicitis. [facility].   Elpidio AnisShari Loel Betancur, PA-C 07/20/14 [id number]  Paula LibraJohn Molpus, MD 07/20/14 0630
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
## 2014-07-20 NOTE — Discharge Instructions (Signed)

## 2014-07-20 NOTE — Progress Notes (Signed)
D   Pt is bright and pleasant   She reports being irritable because she has missed her medication for 2 days    She said that is why her depakote level was low   Pt interacts well with others and has appropriate behaviors   She asked a lot of questions about her medications and needed reassurance that was ok A   Pt praised for medication knowledge   Verbal support given   Medications administered and effectiveness monitored Q 15 min checks    R   Pt safe at present

## 2014-07-21 ENCOUNTER — Encounter (HOSPITAL_COMMUNITY): Payer: Self-pay | Admitting: Registered Nurse

## 2014-07-21 MED ORDER — NAPROXEN 375 MG PO TABS: 375.0000 mg | ORAL_TABLET | Freq: Two times a day (BID) | ORAL | Status: DC | PRN

## 2014-07-21 MED ORDER — QUETIAPINE FUMARATE 300 MG PO TABS: 300.0000 mg | ORAL_TABLET | Freq: Every day | ORAL | Status: DC

## 2014-07-21 MED ORDER — DIVALPROEX SODIUM ER 500 MG PO TB24: 1000.0000 mg | ORAL_TABLET | Freq: Every day | ORAL | Status: DC

## 2014-07-21 MED ORDER — SERTRALINE HCL 50 MG PO TABS: 150.0000 mg | ORAL_TABLET | Freq: Every day | ORAL | Status: DC

## 2014-07-21 MED ORDER — HYDROXYZINE HCL 25 MG PO TABS: 25.0000 mg | ORAL_TABLET | Freq: Three times a day (TID) | ORAL | Status: DC | PRN

## 2014-07-21 NOTE — Progress Notes (Signed)
Patient ID: Jordan Hanson, female   DOB: Jan 20, 1995, 19 y.o.   MRN: [id number]  Pt. Denies SI/HI and A/V hallucinations. Belongings returned to patient at time of discharge. Patient denies any pain or discomfort. Discharge instructions and medications were reviewed with patient. Patient verbalized understanding of both medications and discharge instructions. Patient was discharged to lobby where [facility] Patient left in good spirits with no distress noted on discharge. Q15 minute safety checks maintained until discharge.

## 2014-07-21 NOTE — [facility] Group Notes (Signed)
[facility] Note  07/21/2014 8:45 AM  Pt did not attend, declined invitation.   Chad CordialLauren Carter, LCSWA 07/21/2014 10:00 AM

## 2014-07-21 NOTE — Progress Notes (Signed)
  [facility] :  Will you be returning to the same living situation after discharge:  Yes,  [facility] At discharge, do you have transportation home?: Yes,  Pt has her own car Do you have the ability to pay for your medications: Yes,  provided with samples and prescriptions  Release of information consent forms completed and in the chart;  Patient's signature needed at discharge.  Patient to Follow up at: Follow-up Information    Follow up with CATES, SHIRLEY, NP.   Specialty:  Internal Medicine   Why:  As needed   Contact information:   [address] [place] [address] [phone]       Follow up with [facility] On 07/26/2014.   Specialty:  Behavioral Health   Why:  at 10am for therapy with Gunnar FusiPaula.   Contact information:   [address] [place] [address] [phone]       Follow up with [facility] On 08/17/2014.   Specialty:  Behavioral Health   Why:  at 1:40pm for medication management with Reola CalkinsJo Hughes   Contact information:   [address] [place] [address] [phone]       Patient denies SI/HI: Yes,  Pt denies    Safety Planning and Suicide Prevention discussed: Yes,  with Pt. Declined family contact  Have you used any form of tobacco in the last 30 days? (Cigarettes, Smokeless Tobacco, Cigars, and/or Pipes): Yes  Has patient been referred to the Quitline?: N/A patient is not a smoker  Elaina HoopsCarter, Samariah Hokenson M 07/21/2014, 3:05 PM

## 2014-07-21 NOTE — [facility] Suicide Risk Assessment (Signed)
[facility]   Demographic Factors:  19 year old single female, employed, no children, [facility]   Total Time spent with patient: 30 minutes  Musculoskeletal: Strength & Muscle Tone: within normal limits Gait & Station: normal Patient leans: N/A  Psychiatric Specialty Exam: Physical Exam  ROS  Blood pressure 95/69, pulse 103, temperature 97.8 F (36.6 C), temperature source Oral, resp. rate 16, height 5' 3.78" (1.62 m), weight 204 lb (92.534 kg), last menstrual period 07/11/2014, SpO2 100 %.Body mass index is 35.26 kg/(m^2).  General Appearance: Well Groomed  Patent attorneyye Contact::  Good  Speech:  Normal Rate409  Volume:  Normal  Mood:  Euthymic  Affect:  Appropriate and Full Range  Thought Process:  Goal Directed and Linear  Orientation:  Full (Time, Place, and Person)  Thought Content:  no hallucinations, no delusions, not internally preoccupied   Suicidal Thoughts:  No- denies any suicidal or self injurious ideations  Homicidal Thoughts:  No  Memory:  recent and remote grossly intact   Judgement:  Other:  improved   Insight:  Present  Psychomotor Activity:  Normal  Concentration:  Good  Recall:  Good  Fund of Knowledge:Good  Language: Good  Akathisia:  Negative  Handed:  Right  AIMS (if indicated):     Assets:  Communication Skills Desire for Improvement Physical Health Resilience  Sleep:     Cognition: WNL  ADL's:  Improved    Have you used any form of tobacco in the last 30 days? (Cigarettes, Smokeless Tobacco, Cigars, and/or Pipes): Yes  Has this patient used any form of tobacco in the last 30 days? (Cigarettes, Smokeless Tobacco, Cigars, and/or Pipes) Yes, A prescription for an FDA-approved tobacco cessation medication was offered at discharge and the patient refused  Mental Status Per Nursing Assessment::   On Admission:     Current Mental Status by Physician: At this time she is improved compared to admission-  with a full range of affect, denies depression and presents euthymic, no thought disorder, no SI, no homicidal ideations, no psychotic symptoms, future oriented, speaking about returning to work and saving money to move into an apartment and going back to college .  Loss Factors: Homelessness, financial difficulties   Historical Factors: Recent psychiatric admission for depression- history of self cutting.   Risk Reduction Factors:   Religious beliefs about death, Employed, Positive social support and Positive coping skills or problem solving skills  Continued Clinical Symptoms:  At this time much improved , as above .  Denies medication side effects.  Cognitive Features That Contribute To Risk:  No gross cognitive deficits noted upon discharge. Is alert , attentive, and oriented x 3  Suicide Risk:  Mild:  Suicidal ideation of limited frequency, intensity, duration, and specificity.  There are no identifiable plans, no associated intent, mild dysphoria and related symptoms, good self-control (both objective and subjective assessment), few other risk factors, and identifiable protective factors, including available and accessible social support.  Principal Problem: MDD (major depressive disorder), recurrent severe, without psychosis Discharge Diagnoses:  Patient Active Problem List   Diagnosis Date Noted  . Suicidal ideation [R45.851] 07/19/2014  . MDD (major depressive disorder), recurrent severe, without psychosis [F33.2] 06/19/2014  . MDD (major depressive disorder) [F32.2] 06/17/2014    Follow-up Information    Follow up with CATES, Talbert ForestSHIRLEY, NP.   Specialty:  Internal Medicine   Why:  As needed   Contact information:   [id number]  [place] [address] [phone]       Follow up with [facility] On 07/26/2014.   Specialty:  Behavioral Health   Why:  at 10am for therapy with Gunnar Fusi.   Contact information:   [address] [place] [address] [phone]       Follow up with [facility] On 08/17/2014.   Specialty:  Behavioral Health   Why:  at 1:40pm for medication management with Tamela Oddi   Contact information:   [address] [place] [address] [phone]       Plan Of Care/Follow-up recommendations:  Activity:  as tolerated  Diet:  Regular Tests:  NA Other:  See below  Is patient on multiple antipsychotic therapies at discharge:  No   Has Patient had three or more failed trials of antipsychotic monotherapy by history:  No  Recommended Plan for Multiple Antipsychotic Therapies: NA   Patient is requesting discharge and there are no current grounds for involuntary commitment. She is leaving unit in good spirits. Plans to return to [facility] and return to work later this week.    Cortez Flippen 07/21/2014, 10:55 AM

## 2014-07-21 NOTE — Progress Notes (Signed)
Recreation Therapy Notes  Date: 07.13.16 Time: 9:30 am Location: [address] Day[address]  Group Topic: Stress Management  Goal Area(s) Addresses:  Patient will verbalize importance of using healthy stress management.  Patient will identify positive emotions associated with healthy stress management.   Intervention: Stress Management  Activity :  Guided Imagery.  LRT introduced and explained the stress management technique of guided imagery.  LRT used a script to deliver the technique.  Patients were asked to follow the scripts read a loud by the LRT to participate in the stress management technique.  Education:  Stress Management, Discharge Planning.   Education Outcome: Acknowledges edcuation/In group clarification offered/Needs additional education  Clinical Observations/Feedback: Patient did not attend group.   Samiyah Stupka, LRT/CTRS         Annette Bertelson A 07/21/2014 1:41 PM 

## 2014-07-21 NOTE — Clinical Social Work Note (Signed)
Patient assigned care coordinator, Marisue IvanLiz [phone]) w [facility].  Wants to assist w discharge planning, has no phone number to reach patient, wants to get hold of her while hospitalized.  Santa GeneraAnne Abenezer Odonell, LCSW Clinical Social Worker

## 2014-07-21 NOTE — Discharge Summary (Signed)
Physician Discharge Summary Note  Patient:  Jordan Hanson is an 19 y.o., female MRN:  [id number] DOB:  1995/10/16 Patient phone:  [phone] (home)  Patient address:   [address] [facility] [place] [address],  Total Time spent with patient: 45 minutes  Date of Admission:  07/19/2014 Date of Discharge: 07/21/2014  Reason for Admission:  Per H&P Note:19 year old single female, who is known to our unit from recent psychiatric admission From 6/9- 6/12/ 16. At that time had episode of increased depression, self cutting. She states she has had " good days in general, but I keep on having bad moments, where I feel more sad, more lonely, I start crying ".  States these episodes are generally of short duration, most often just a few hours , less than a day. Denies any psychotic symptoms.  States " basically what I have are short lived but pretty intense mood swings ". She states she has generally better able to cope with these Episodes over recent months/years, compared to Her prior history of frequent cutting, but recently did feel overwhelmed. She states these episodes of increased sadness occur without any clear external stressors . States , " it's like the depression part is still not that well controlled, although in general I am better". She had some vague SI, and States that about two weeks ago she had impulsively overdosed on Trazodone ( " about 8 tablets ), but had not seeked treatment at that time. She has not engaged in any self cutting or self mutilation since June /16. She denies any drugs or alcohol use since June .      Principal Problem: MDD (major depressive disorder), recurrent severe, without psychosis Discharge Diagnoses: Patient Active Problem List   Diagnosis Date Noted  . Suicidal ideation [R45.851] 07/19/2014  . MDD (major depressive disorder), recurrent severe, without psychosis [F33.2] 06/19/2014  . MDD (major depressive disorder) [F32.2] 06/17/2014     Musculoskeletal: Strength & Muscle Tone: within normal limits Gait & Station: normal Patient leans: N/A  Psychiatric Specialty Exam:  See Suicide Risk Assessment Physical Exam  Nursing note and vitals reviewed. Constitutional: She is oriented to person, place, and time.  Neck: Normal range of motion.  Respiratory: Effort normal.  Musculoskeletal: Normal range of motion.  Neurological: She is alert and oriented to person, place, and time.    Review of Systems  Psychiatric/Behavioral: Negative for suicidal ideas and hallucinations. Depression: stable. Nervous/anxious: stable. Insomnia: stable.   All other systems reviewed and are negative.   Blood pressure 95/69, pulse 103, temperature 97.8 F (36.6 C), temperature source Oral, resp. rate 16, height 5' 3.78" (1.62 m), weight 92.534 kg (204 lb), last menstrual period 07/11/2014, SpO2 100 %.Body mass index is 35.26 kg/(m^2).  Have you used any form of tobacco in the last 30 days? (Cigarettes, Smokeless Tobacco, Cigars, and/or Pipes): Yes  Has this patient used any form of tobacco in the last 30 days? (Cigarettes, Smokeless Tobacco, Cigars, and/or Pipes) No  Past Medical History:  Past Medical History  Diagnosis Date  . Bipolar disorder   . ADHD (attention deficit hyperactivity disorder)   . Depression     Past Surgical History  Procedure Laterality Date  . Tonsillectomy     Family History: History reviewed. No pertinent family history. Social History:  History  Alcohol Use  . Yes    Comment: occ     History  Drug Use No    History   Social History  .  Marital Status: Single    Spouse Name: N/A  . Number of Children: N/A  . Years of Education: N/A   Social History Main Topics  . Smoking status: Never Smoker   . Smokeless tobacco: Not on file  . Alcohol Use: Yes     Comment: occ  . Drug Use: No  . Sexual Activity: Not on file   Other Topics Concern  . None   Social History Narrative   Risk to Self: Is  patient at risk for suicide?: Yes Risk to Others:   Prior Inpatient Therapy:   Prior Outpatient Therapy:    Level of Care:  OP  Hospital Course:  Jordan Hanson was admitted for MDD (major depressive disorder), recurrent severe, without psychosis and crisis management.  She was treated discharged with the medications listed below under Medication List.  Medical problems were identified and treated as needed.  Home medications were restarted as appropriate.  Improvement was monitored by observation and Jordan Hanson daily report of symptom reduction.  Emotional and mental status was monitored by daily self-inventory reports completed by Jordan Hanson and clinical staff.         Jordan Hanson was evaluated by the treatment team for stability and plans for continued recovery upon discharge.  Jordan Hanson motivation was an integral factor for scheduling further treatment.  Employment, transportation, bed availability, health status, family support, and any pending legal issues were also considered during her hospital stay.  She was offered further treatment options upon discharge including but not limited to Residential, Intensive Outpatient, and Outpatient treatment.  Jordan Hanson will follow up with the services as listed below under Follow Up Information.     Upon completion of this admission the patient was both mentally and medically stable for discharge denying suicidal/homicidal ideation, auditory/visual/tactile hallucinations, delusional thoughts and paranoia.      Consults:  psychiatry  Significant Diagnostic Studies:  labs: Urinalysis, CMET, CBC, T4 free, TSH, Valproic acid  level, Pregnancy,   Discharge Vitals:   Blood pressure 95/69, pulse 103, temperature 97.8 F (36.6 C), temperature source Oral, resp. rate 16, height 5' 3.78" (1.62 m), weight 92.534 kg (204 lb), last menstrual period 07/11/2014, SpO2 100 %. Body mass index is 35.26 kg/(m^2). Lab Results:   Results for orders  placed or performed during the hospital encounter of 07/19/14 (from the past 72 hour(s))  T4, free     Status: None   Collection Time: 07/19/14  7:13 PM  Result Value Ref Range   Free T4 0.71 0.61 - 1.12 ng/dL    Comment: Performed at [facility]  TSH     Status: None   Collection Time: 07/19/14  7:13 PM  Result Value Ref Range   TSH 3.758 0.350 - 4.500 uIU/mL    Comment: Performed at [facility]  Valproic acid level     Status: Abnormal   Collection Time: 07/19/14  7:13 PM  Result Value Ref Range   Valproic Acid Lvl <10 (L) 50.0 - 100.0 ug/mL    Comment: RESULTS CONFIRMED BY MANUAL DILUTION Performed at [facility]   Comprehensive metabolic panel     Status: None   Collection Time: 07/20/14 12:12 AM  Result Value Ref Range   Sodium 139 135 - 145 mmol/L   Potassium 3.8 3.5 - 5.1 mmol/L   Chloride 107 101 - 111 mmol/L   CO2 23 22 - 32 mmol/L   Glucose, Bld 85  65 - 99 mg/dL   BUN 14 6 - 20 mg/dL   Creatinine, Ser 0.46 0.44 - 1.00 mg/dL   Calcium 9.6 8.9 - 10.3 mg/dL   Total Protein 8.1 6.5 - 8.1 g/dL   Albumin 3.9 3.5 - 5.0 g/dL   AST 24 15 - 41 U/L   ALT 24 14 - 54 U/L   Alkaline Phosphatase 58 38 - 126 U/L   Total Bilirubin 0.5 0.3 - 1.2 mg/dL   GFR calc non Af Amer >60 >60 mL/min   GFR calc Af Amer >60 >60 mL/min    Comment: (NOTE) The eGFR has been calculated using the CKD EPI equation. This calculation has not been validated in all clinical situations. eGFR's persistently <60 mL/min signify possible Chronic Kidney Disease.    Anion gap 9 5 - 15  CBC     Status: None   Collection Time: 07/20/14 12:12 AM  Result Value Ref Range   WBC 10.2 4.0 - 10.5 K/uL   RBC 4.87 3.87 - 5.11 MIL/uL   Hemoglobin 13.6 12.0 - 15.0 g/dL   HCT 40.9 36.0 - 46.0 %   MCV 84.0 78.0 - 100.0 fL   MCH 27.9 26.0 - 34.0 pg   MCHC 33.3 30.0 - 36.0 g/dL   RDW 12.9 11.5 - 15.5 %   Platelets 331 150 - 400 K/uL  Urinalysis with microscopic      Status: Abnormal   Collection Time: 07/20/14 12:19 AM  Result Value Ref Range   Color, Urine YELLOW YELLOW   APPearance CLOUDY (A) CLEAR   Specific Gravity, Urine 1.026 1.005 - 1.030   pH 7.5 5.0 - 8.0   Glucose, UA NEGATIVE NEGATIVE mg/dL   Hgb urine dipstick TRACE (A) NEGATIVE   Bilirubin Urine NEGATIVE NEGATIVE   Ketones, ur NEGATIVE NEGATIVE mg/dL   Protein, ur NEGATIVE NEGATIVE mg/dL   Urobilinogen, UA 1.0 0.0 - 1.0 mg/dL   Nitrite NEGATIVE NEGATIVE   Leukocytes, UA SMALL (A) NEGATIVE   WBC, UA 11-20 <3 WBC/hpf   RBC / HPF 3-6 <3 RBC/hpf   Bacteria, UA RARE RARE   Squamous Epithelial / LPF FEW (A) RARE   Urine-Other AMORPHOUS URATES/PHOSPHATES   I-Stat beta hCG blood, ED     Status: None   Collection Time: 07/20/14  1:46 AM  Result Value Ref Range   I-stat hCG, quantitative <5.0 <5 mIU/mL   Comment 3            Comment:   GEST. AGE      CONC.  (mIU/mL)   <=1 WEEK        5 - 50     2 WEEKS       50 - 500     3 WEEKS       100 - 10,000     4 WEEKS     1,000 - 30,000        FEMALE AND NON-PREGNANT FEMALE:     LESS THAN 5 mIU/mL     Physical Findings: AIMS: Facial and Oral Movements Muscles of Facial Expression: None, normal Lips and Perioral Area: None, normal Jaw: None, normal Tongue: None, normal,Extremity Movements Upper (arms, wrists, hands, fingers): None, normal Lower (legs, knees, ankles, toes): None, normal, Trunk Movements Neck, shoulders, hips: None, normal, Overall Severity Severity of abnormal movements (highest score from questions above): None, normal Incapacitation due to abnormal movements: None, normal Patient's awareness of abnormal movements (rate only patient's report): No Awareness, Dental Status Current problems  with teeth and/or dentures?: No Does patient usually wear dentures?: No  CIWA:    COWS:      See Psychiatric Specialty Exam and Suicide Risk Assessment completed by Attending Physician prior to discharge.  Discharge destination:   Home  Is patient on multiple antipsychotic therapies at discharge:  No   Has Patient had three or more failed trials of antipsychotic monotherapy by history:  No    Recommended Plan for Multiple Antipsychotic Therapies: NA      Discharge Instructions    Activity as tolerated - No restrictions    Complete by:  As directed      Diet - low sodium heart healthy    Complete by:  As directed      Discharge instructions    Complete by:  As directed   Take all of you medications as prescribed by your mental healthcare provider.  Report any adverse effects and reactions from your medications to your outpatient provider promptly. Do not engage in alcohol and or illegal drug use while on prescription medicines. In the event of worsening symptoms call the crisis hotline, 911, and or go to the nearest emergency department for appropriate evaluation and treatment of symptoms. Follow-up with your primary care provider for your medical issues, concerns and or health care needs.   Keep all scheduled appointments.  If you are unable to keep an appointment call to reschedule.  Let the nurse know if you will need medications before next scheduled appointment.            Medication List    STOP taking these medications        ibuprofen 200 MG tablet  Commonly known as:  ADVIL,MOTRIN     MONONESSA 0.25-35 MG-MCG tablet  Generic drug:  norgestimate-ethinyl estradiol     traZODone 50 MG tablet  Commonly known as:  DESYREL      TAKE these medications      Indication   divalproex 500 MG 24 hr tablet  Commonly known as:  DEPAKOTE ER  Take 2 tablets (1,000 mg total) by mouth at bedtime.   Indication:  Mood Control     hydrOXYzine 25 MG tablet  Commonly known as:  ATARAX/VISTARIL  Take 1 tablet (25 mg total) by mouth 3 (three) times daily as needed for anxiety.   Indication:  Anxiety     naproxen 375 MG tablet  Commonly known as:  NAPROSYN  Take 1 tablet (375 mg total) by mouth every 12  (twelve) hours as needed for moderate pain or headache.   Indication:  Pain     Norgestimate-Ethinyl Estradiol Triphasic 0.18/0.215/0.25 MG-35 MCG tablet  Take by mouth. Last night      QUEtiapine 300 MG tablet  Commonly known as:  SEROQUEL  Take 1 tablet (300 mg total) by mouth at bedtime.   Indication:  Mood Control     sertraline 50 MG tablet  Commonly known as:  ZOLOFT  Take 3 tablets (150 mg total) by mouth daily.   Indication:  Major Depressive Disorder       Follow-up Information    Follow up with CATES, SHIRLEY, NP.   Specialty:  Internal Medicine   Why:  As needed   Contact information:   [address] [place] [address] [phone]       Follow up with [facility] On 07/26/2014.   Specialty:  Behavioral Health   Why:  at 10am for therapy with Nevin Bloodgood.  Contact information:   [address] [place] [address] [phone]       Follow up with [facility] On 08/17/2014.   Specialty:  Behavioral Health   Why:  at 1:40pm for medication management with Eino Farber   Contact information:   [address] [place] [address] [phone]       Follow-up recommendations:  Activity:  As tolerated Diet:  As tolerated  Comments:   Patient has been instructed to take medications as prescribed; and report adverse effects to outpatient provider.  Follow up with primary doctor for any medical issues and If symptoms recur report to nearest emergency or crisis hot line.    Total Discharge Time: 45 minutes  Signed: Earleen Newport, FNP-BC  07/21/2014, 1:47 PM   Patient seen, Suicide Assessment Completed.  Disposition Plan Reviewed

## 2014-10-06 ENCOUNTER — Emergency Department (HOSPITAL_COMMUNITY)
Admission: EM | Admit: 2014-10-06 | Discharge: 2014-10-06 | Disposition: A | Discharge status: Home / Self Care | Payer: Medicaid Other | Attending: Emergency Medicine | Admitting: Emergency Medicine

## 2014-10-06 ENCOUNTER — Encounter (HOSPITAL_COMMUNITY): Payer: Self-pay | Admitting: Emergency Medicine

## 2014-10-06 DIAGNOSIS — Z880 Allergy status to penicillin: Secondary | ICD-10-CM | POA: Diagnosis not present

## 2014-10-06 DIAGNOSIS — Z3202 Encounter for pregnancy test, result negative: Secondary | ICD-10-CM | POA: Diagnosis not present

## 2014-10-06 DIAGNOSIS — Z79899 Other long term (current) drug therapy: Secondary | ICD-10-CM | POA: Insufficient documentation

## 2014-10-06 DIAGNOSIS — R197 Diarrhea, unspecified: Secondary | ICD-10-CM | POA: Diagnosis not present

## 2014-10-06 DIAGNOSIS — F319 Bipolar disorder, unspecified: Secondary | ICD-10-CM | POA: Insufficient documentation

## 2014-10-06 DIAGNOSIS — R1030 Lower abdominal pain, unspecified: Secondary | ICD-10-CM | POA: Insufficient documentation

## 2014-10-06 DIAGNOSIS — R112 Nausea with vomiting, unspecified: Secondary | ICD-10-CM | POA: Insufficient documentation

## 2014-10-06 DIAGNOSIS — R1084 Generalized abdominal pain: Secondary | ICD-10-CM | POA: Diagnosis present

## 2014-10-06 LAB — URINALYSIS, ROUTINE W REFLEX MICROSCOPIC
Bilirubin Urine: NEGATIVE
Glucose, UA: NEGATIVE mg/dL
Ketones, ur: NEGATIVE mg/dL
Nitrite: NEGATIVE
PROTEIN: NEGATIVE mg/dL
Specific Gravity, Urine: 1.03 (ref 1.005–1.030)
Urobilinogen, UA: 1 mg/dL (ref 0.0–1.0)
pH: 6.5 (ref 5.0–8.0)

## 2014-10-06 LAB — COMPREHENSIVE METABOLIC PANEL
ALT: 22 U/L (ref 14–54)
AST: 21 U/L (ref 15–41)
Albumin: 3.3 g/dL — ABNORMAL LOW (ref 3.5–5.0)
Alkaline Phosphatase: 66 U/L (ref 38–126)
Anion gap: 11 (ref 5–15)
BUN: 8 mg/dL (ref 6–20)
CO2: 24 mmol/L (ref 22–32)
CREATININE: 0.68 mg/dL (ref 0.44–1.00)
Calcium: 9.7 mg/dL (ref 8.9–10.3)
Chloride: 104 mmol/L (ref 101–111)
GFR calc Af Amer: >60 mL/min (ref >60)
GFR calc non Af Amer: >60 mL/min (ref >60)
Glucose, Bld: 104 mg/dL — ABNORMAL HIGH (ref 65–99)
Potassium: 3.9 mmol/L (ref 3.5–5.1)
Sodium: 139 mmol/L (ref 135–145)
TOTAL PROTEIN: 7.5 g/dL (ref 6.5–8.1)
Total Bilirubin: 0.5 mg/dL (ref 0.3–1.2)

## 2014-10-06 LAB — CBC
HCT: 41.8 % (ref 36.0–46.0)
Hemoglobin: 13.7 g/dL (ref 12.0–15.0)
MCH: 27.7 pg (ref 26.0–34.0)
MCHC: 32.8 g/dL (ref 30.0–36.0)
MCV: 84.4 fL (ref 78.0–100.0)
Platelets: 340 10*3/uL (ref 150–400)
RBC: 4.95 MIL/uL (ref 3.87–5.11)
RDW: 12.3 % (ref 11.5–15.5)
WBC: 8.1 10*3/uL (ref 4.0–10.5)

## 2014-10-06 LAB — URINE MICROSCOPIC-ADD ON

## 2014-10-06 LAB — POC URINE PREG, ED: Preg Test, Ur: NEGATIVE

## 2014-10-06 LAB — LIPASE, BLOOD: Lipase: 37 U/L (ref 22–51)

## 2014-10-06 MED ORDER — KETOROLAC TROMETHAMINE 30 MG/ML IJ SOLN
30.0000 mg | Freq: Once | INTRAMUSCULAR | Status: AC
  Administered 2014-10-06: 30 mg via INTRAVENOUS
  Filled 2014-10-06: qty 1

## 2014-10-06 MED ORDER — ONDANSETRON HCL 4 MG/2ML IJ SOLN
4.0000 mg | Freq: Once | INTRAMUSCULAR | Status: AC
  Administered 2014-10-06: 4 mg via INTRAVENOUS
  Filled 2014-10-06: qty 2

## 2014-10-06 NOTE — ED Notes (Signed)
Pt. reports pain across the abdomen onset 2 weeks ago with emesis and diarrhea , denies fever or chills , no urinary discomfort or vaginal bleeding .

## 2014-10-06 NOTE — Discharge Instructions (Signed)
Abdominal Pain, Women Ms. Graves, your blood work today is normal. Take ibuprofen 800 mg every 8 hours as needed for pain control. See a primary care physician within 3 days for close follow-up. If symptoms worsen come back to the emergency department immediately. Thank you. Abdominal (stomach, pelvic, or belly) pain can be caused by many things. It is important to tell your doctor:  The location of the pain.  Does it come and go or is it present all the time?  Are there things that start the pain (eating certain foods, exercise)?  Are there other symptoms associated with the pain (fever, nausea, vomiting, diarrhea)? All of this is helpful to know when trying to find the cause of the pain. CAUSES   Stomach: virus or bacteria infection, or ulcer.  Intestine: appendicitis (inflamed appendix), regional ileitis (Crohn's disease), ulcerative colitis (inflamed colon), irritable bowel syndrome, diverticulitis (inflamed diverticulum of the colon), or cancer of the stomach or intestine.  Gallbladder disease or stones in the gallbladder.  Kidney disease, kidney stones, or infection.  Pancreas infection or cancer.  Fibromyalgia (pain disorder).  Diseases of the female organs:  Uterus: fibroid (non-cancerous) tumors or infection.  Fallopian tubes: infection or tubal pregnancy.  Ovary: cysts or tumors.  Pelvic adhesions (scar tissue).  Endometriosis (uterus lining tissue growing in the pelvis and on the pelvic organs).  Pelvic congestion syndrome (female organs filling up with blood just before the menstrual period).  Pain with the menstrual period.  Pain with ovulation (producing an egg).  Pain with an IUD (intrauterine device, birth control) in the uterus.  Cancer of the female organs.  Functional pain (pain not caused by a disease, may improve without treatment).  Psychological pain.  Depression. DIAGNOSIS  Your doctor will decide the seriousness of your pain by doing an  examination.  Blood tests.  X-rays.  Ultrasound.  CT scan (computed tomography, special type of X-ray).  MRI (magnetic resonance imaging).  Cultures, for infection.  Barium enema (dye inserted in the large intestine, to better view it with X-rays).  Colonoscopy (looking in intestine with a lighted tube).  Laparoscopy (minor surgery, looking in abdomen with a lighted tube).  Major abdominal exploratory surgery (looking in abdomen with a large incision). TREATMENT  The treatment will depend on the cause of the pain.   Many cases can be observed and treated at home.  Over-the-counter medicines recommended by your caregiver.  Prescription medicine.  Antibiotics, for infection.  Birth control pills, for painful periods or for ovulation pain.  Hormone treatment, for endometriosis.  Nerve blocking injections.  Physical therapy.  Antidepressants.  Counseling with a psychologist or psychiatrist.  Minor or major surgery. HOME CARE INSTRUCTIONS   Do not take laxatives, unless directed by your caregiver.  Take over-the-counter pain medicine only if ordered by your caregiver. Do not take aspirin because it can cause an upset stomach or bleeding.  Try a clear liquid diet (broth or water) as ordered by your caregiver. Slowly move to a bland diet, as tolerated, if the pain is related to the stomach or intestine.  Have a thermometer and take your temperature several times a day, and record it.  Bed rest and sleep, if it helps the pain.  Avoid sexual intercourse, if it causes pain.  Avoid stressful situations.  Keep your follow-up appointments and tests, as your caregiver orders.  If the pain does not go away with medicine or surgery, you may try:  Acupuncture.  Relaxation exercises (yoga, meditation).  Group  therapy.  Counseling. SEEK MEDICAL CARE IF:   You notice certain foods cause stomach pain.  Your home care treatment is not helping your pain.  You  need stronger pain medicine.  You want your IUD removed.  You feel faint or lightheaded.  You develop nausea and vomiting.  You develop a rash.  You are having side effects or an allergy to your medicine. SEEK IMMEDIATE MEDICAL CARE IF:   Your pain does not go away or gets worse.  You have a fever.  Your pain is felt only in portions of the abdomen. The right side could possibly be appendicitis. The left lower portion of the abdomen could be colitis or diverticulitis.  You are passing blood in your stools (bright red or black tarry stools, with or without vomiting).  You have blood in your urine.  You develop chills, with or without a fever.  You pass out. MAKE SURE YOU:   Understand these instructions.  Will watch your condition.  Will get help right away if you are not doing well or get worse. Document Released: 10/22/2006 Document Revised: 05/11/2013 Document Reviewed: 11/11/2008 [facility] Patient Information 2015 [place], [place]. This information is not intended to replace advice given to you by your health care provider. Make sure you discuss any questions you have with your health care provider.

## 2014-10-06 NOTE — ED Provider Notes (Signed)
CSN: [id number]     Arrival date & time 10/06/14  0135 History   By signing my name below, I, Arlan Organ, attest that this documentation has been prepared under the direction and in the presence of Tomasita Crumble, MD. Electronically Signed: Arlan Organ, ED Scribe. 10/06/2014. 2:44 AM.   Chief Complaint  Patient presents with  . Abdominal Pain   HPI  HPI Comments: Jordan Hanson is a 19 y.o. female without any pertinent past medical history who presents to the Emergency Department complaining of constant, ongoing diffuse abdominal pain x 2 weeks. Pain is described as sharp. Discomfort is made worse after bowel movements. No alleviating factors at this time. Ongoing continuous diarrhea and occasional vomiting also reported. No OTC medications or home remedies attempted at home. No fever, shortness of breath, or chest pain. No vaginal bleeding or abnormal vaginal discharge. Pt with known allergy to penicillins.   Past Medical History  Diagnosis Date  . Bipolar disorder   . ADHD (attention deficit hyperactivity disorder)   . Depression    Past Surgical History  Procedure Laterality Date  . Tonsillectomy     No family history on file. Social History  Substance Use Topics  . Smoking status: Never Smoker   . Smokeless tobacco: None  . Alcohol Use: Yes     Comment: occ   OB History    No data available     Review of Systems  Constitutional: Negative for fever and chills.  HENT: Negative for congestion.   Respiratory: Negative for cough and shortness of breath.   Cardiovascular: Negative for chest pain.  Gastrointestinal: Positive for nausea, vomiting, abdominal pain and diarrhea.  Genitourinary: Negative for dysuria, vaginal bleeding and vaginal discharge.  Skin: Negative for rash.  Neurological: Negative for headaches.  Psychiatric/Behavioral: Negative for confusion.  All other systems reviewed and are negative.     Allergies  Penicillins  Home Medications   Prior to  Admission medications   Medication Sig Start Date End Date Taking? Authorizing Provider  divalproex (DEPAKOTE ER) 500 MG 24 hr tablet Take 2 tablets (1,000 mg total) by mouth at bedtime. 07/21/14   Shuvon B Rankin, NP  hydrOXYzine (ATARAX/VISTARIL) 25 MG tablet Take 1 tablet (25 mg total) by mouth 3 (three) times daily as needed for anxiety. 07/21/14   Shuvon B Rankin, NP  naproxen (NAPROSYN) 375 MG tablet Take 1 tablet (375 mg total) by mouth every 12 (twelve) hours as needed for moderate pain or headache. 07/21/14   Shuvon B Rankin, NP  Norgestimate-Ethinyl Estradiol Triphasic 0.18/0.215/0.25 MG-35 MCG tablet Take by mouth. Last night    Historical Provider, MD  QUEtiapine (SEROQUEL) 300 MG tablet Take 1 tablet (300 mg total) by mouth at bedtime. 07/21/14   Shuvon B Rankin, NP  sertraline (ZOLOFT) 50 MG tablet Take 3 tablets (150 mg total) by mouth daily. 07/21/14   Shuvon B Rankin, NP   Triage Vitals: BP 116/97 mmHg  Pulse 94  Temp(Src) 98.8 F (37.1 C) (Oral)  Resp 18  Ht  (1.575 m)  Wt 220 lb (99.791 kg)  BMI 40.23 kg/m2  SpO2 100%  LMP 09/22/2014 (Approximate)   Physical Exam  Constitutional: She is oriented to person, place, and time. She appears well-developed and well-nourished. No distress.  HENT:  Head: Normocephalic and atraumatic.  Nose: Nose normal.  Mouth/Throat: Oropharynx is clear and moist. No oropharyngeal exudate.  Eyes: Conjunctivae and EOM are normal. Pupils are equal, round, and reactive to light. No scleral  icterus.  Neck: Normal range of motion. Neck supple. No JVD present. No tracheal deviation present. No thyromegaly present.  Cardiovascular: Normal rate, regular rhythm and normal heart sounds.  Exam reveals no gallop and no friction rub.   No murmur heard. Pulmonary/Chest: Effort normal and breath sounds normal. No respiratory distress. She has no wheezes. She exhibits no tenderness.  Abdominal: Soft. Bowel sounds are normal. She exhibits no distension and  no mass. There is no tenderness. There is no rebound and no guarding.  Musculoskeletal: Normal range of motion. She exhibits no edema or tenderness.  Lymphadenopathy:    She has no cervical adenopathy.  Neurological: She is alert and oriented to person, place, and time. No cranial nerve deficit. She exhibits normal muscle tone.  Skin: Skin is warm and dry. No rash noted. No erythema. No pallor.  Nursing note and vitals reviewed.   ED Course  Procedures (including critical care time)  DIAGNOSTIC STUDIES: Oxygen Saturation is 100% on RA, Normal by my interpretation.    COORDINATION OF CARE: 2:41 AM- Will give Toradol and Zofran. Will order lipase, CMP, CBC, urinalysis, and urine pregnancy. Discussed treatment plan with pt at bedside and pt agreed to plan.     Labs Review Labs Reviewed  COMPREHENSIVE METABOLIC PANEL - Abnormal; Notable for the following:    Glucose, Bld 104 (*)    Albumin 3.3 (*)    All other components within normal limits  URINALYSIS, ROUTINE W REFLEX MICROSCOPIC (NOT AT [facility]) - Abnormal; Notable for the following:    APPearance CLOUDY (*)    Hgb urine dipstick SMALL (*)    Leukocytes, UA MODERATE (*)    All other components within normal limits  URINE MICROSCOPIC-ADD ON - Abnormal; Notable for the following:    Squamous Epithelial / LPF MANY (*)    Bacteria, UA MANY (*)    All other components within normal limits  LIPASE, BLOOD  CBC  POC URINE PREG, ED    Imaging Review No results found. I have personally reviewed and evaluated these images and lab results as part of my medical decision-making.   EKG Interpretation None      MDM   Final diagnoses:  None   Patient presents emergency department for abdominal pain, vomiting, diarrhea. She states is going on for the past 2 weeks. Her tachycardia has resolved without any intervention. She has had no episodes of vomiting or diarrhea in the emergency department. Physical exam is negative for any  abdominal tenderness. Patient was given Toradol and Zofran for symptoms. She is advised to see a primary care physician within 3 days for close follow-up. She appears well in no acute distress, her vital signs remain within her normal limits and she is safe for discharge.  I personally performed the services described in this documentation, which was scribed in my presence. The recorded information has been reviewed and is accurate.   Tomasita Crumble, MD 10/06/14 [phone]

## 2014-11-20 ENCOUNTER — Encounter (HOSPITAL_COMMUNITY): Payer: Self-pay

## 2014-11-20 ENCOUNTER — Emergency Department (HOSPITAL_COMMUNITY)
Admission: EM | Admit: 2014-11-20 | Discharge: 2014-11-21 | Disposition: A | Discharge status: Home / Self Care | Payer: Medicaid Other | Attending: Emergency Medicine | Admitting: Emergency Medicine

## 2014-11-20 DIAGNOSIS — M549 Dorsalgia, unspecified: Secondary | ICD-10-CM | POA: Insufficient documentation

## 2014-11-20 DIAGNOSIS — R1030 Lower abdominal pain, unspecified: Secondary | ICD-10-CM | POA: Insufficient documentation

## 2014-11-20 DIAGNOSIS — Z3202 Encounter for pregnancy test, result negative: Secondary | ICD-10-CM | POA: Diagnosis not present

## 2014-11-20 DIAGNOSIS — Z880 Allergy status to penicillin: Secondary | ICD-10-CM | POA: Diagnosis not present

## 2014-11-20 DIAGNOSIS — R109 Unspecified abdominal pain: Secondary | ICD-10-CM | POA: Diagnosis present

## 2014-11-20 DIAGNOSIS — N898 Other specified noninflammatory disorders of vagina: Secondary | ICD-10-CM | POA: Insufficient documentation

## 2014-11-20 DIAGNOSIS — F319 Bipolar disorder, unspecified: Secondary | ICD-10-CM | POA: Insufficient documentation

## 2014-11-20 DIAGNOSIS — R112 Nausea with vomiting, unspecified: Secondary | ICD-10-CM

## 2014-11-20 DIAGNOSIS — Z79899 Other long term (current) drug therapy: Secondary | ICD-10-CM | POA: Insufficient documentation

## 2014-11-20 LAB — CBC
HCT: 37.5 % (ref 36.0–46.0)
Hemoglobin: 12.1 g/dL (ref 12.0–15.0)
MCH: 27.4 pg (ref 26.0–34.0)
MCHC: 32.3 g/dL (ref 30.0–36.0)
MCV: 85 fL (ref 78.0–100.0)
PLATELETS: 305 10*3/uL (ref 150–400)
RBC: 4.41 MIL/uL (ref 3.87–5.11)
RDW: 12.4 % (ref 11.5–15.5)
WBC: 8.6 10*3/uL (ref 4.0–10.5)

## 2014-11-20 LAB — URINE MICROSCOPIC-ADD ON

## 2014-11-20 LAB — WET PREP, GENITAL
CLUE CELLS WET PREP: NONE SEEN
TRICH WET PREP: NONE SEEN
YEAST WET PREP: NONE SEEN

## 2014-11-20 LAB — URINALYSIS, ROUTINE W REFLEX MICROSCOPIC
Bilirubin Urine: NEGATIVE
GLUCOSE, UA: NEGATIVE mg/dL
KETONES UR: NEGATIVE mg/dL
LEUKOCYTES UA: NEGATIVE
Nitrite: NEGATIVE
PH: 5.5 (ref 5.0–8.0)
Protein, ur: NEGATIVE mg/dL
SPECIFIC GRAVITY, URINE: 1.027 (ref 1.005–1.030)
Urobilinogen, UA: 1 mg/dL (ref 0.0–1.0)

## 2014-11-20 LAB — COMPREHENSIVE METABOLIC PANEL
ALT: 17 U/L (ref 14–54)
AST: 21 U/L (ref 15–41)
Albumin: 2.9 g/dL — ABNORMAL LOW (ref 3.5–5.0)
Alkaline Phosphatase: 56 U/L (ref 38–126)
Anion gap: 9 (ref 5–15)
BUN: 10 mg/dL (ref 6–20)
CHLORIDE: 109 mmol/L (ref 101–111)
CO2: 24 mmol/L (ref 22–32)
CREATININE: 0.54 mg/dL (ref 0.44–1.00)
Calcium: 9.3 mg/dL (ref 8.9–10.3)
GFR calc non Af Amer: >60 mL/min (ref >60)
Glucose, Bld: 100 mg/dL — ABNORMAL HIGH (ref 65–99)
Potassium: 4 mmol/L (ref 3.5–5.1)
SODIUM: 142 mmol/L (ref 135–145)
Total Bilirubin: 0.3 mg/dL (ref 0.3–1.2)
Total Protein: 6.3 g/dL — ABNORMAL LOW (ref 6.5–8.1)

## 2014-11-20 LAB — POC URINE PREG, ED: Preg Test, Ur: NEGATIVE

## 2014-11-20 LAB — LIPASE, BLOOD: LIPASE: 39 U/L (ref 11–51)

## 2014-11-20 MED ORDER — ONDANSETRON 4 MG PO TBDP
8.0000 mg | ORAL_TABLET | Freq: Once | ORAL | Status: AC
  Administered 2014-11-20: 8 mg via ORAL
  Filled 2014-11-20: qty 2

## 2014-11-20 NOTE — ED Notes (Signed)
Pt reports "all over" abdominal pain associated with N/V/D and back pain, onset this morning around 7am. Emesis x 3, diarrhea x 6 times today.

## 2014-11-20 NOTE — ED Provider Notes (Signed)
CSN: [id number]     Arrival date & time 11/20/14  1933 History   First MD Initiated Contact with Patient 11/20/14 2118     Chief Complaint  Patient presents with  . Abdominal Pain   HPI  [name] is a 19 year old female with PMHx of bipolar disorder, depression and ADHD presenting with abdominal pain. Patient reports onset of abdominal pain approximately 14 hours ago. This pain woke her from sleep. She describes it as "sharp and stabbing" all over her lower abdomen and it radiates into her lower back. She states that the pain is constant and is exacerbated by movement such as sitting forward in bed. Denies alleviating factors. She reports associated nausea, vomiting and diarrhea. She reports 3 episodes of vomiting and approximately 5-6 episodes of diarrhea today. She denies blood in the vomit or diarrhea. She has not attempted home conservative therapies for the pain. Denies fevers, chills, headache, chest pain, shortness of breath, dysuria, hematuria, flank pain or vaginal discharge.   Past Medical History  Diagnosis Date  . Bipolar disorder (HCC)   . ADHD (attention deficit hyperactivity disorder)   . Depression    Past Surgical History  Procedure Laterality Date  . Tonsillectomy     No family history on file. Social History  Substance Use Topics  . Smoking status: Never Smoker   . Smokeless tobacco: None  . Alcohol Use: Yes     Comment: occ   OB History    No data available     Review of Systems  Constitutional: Negative for fever, chills and diaphoresis.  Respiratory: Negative for shortness of breath.   Cardiovascular: Negative for chest pain.  Gastrointestinal: Positive for nausea, vomiting, abdominal pain and diarrhea. Negative for constipation and abdominal distention.  Genitourinary: Negative for dysuria, frequency, hematuria, flank pain and vaginal discharge.  Musculoskeletal: Positive for back pain. Negative for myalgias, arthralgias and neck pain.  Skin: Negative  for rash.  Neurological: Negative for dizziness, syncope, weakness, numbness and headaches.  All other systems reviewed and are negative.     Allergies  Penicillins  Home Medications   Prior to Admission medications   Medication Sig Start Date End Date Taking? Authorizing Provider  Aspirin-Acetaminophen-Caffeine (GOODY HEADACHE PO) Take 1 Package by mouth daily as needed (FOR MIGRAINES).   Yes Historical Provider, MD  divalproex (DEPAKOTE ER) 500 MG 24 hr tablet Take 2 tablets (1,000 mg total) by mouth at bedtime. 07/21/14  Yes Shuvon B Rankin, NP  naproxen (NAPROSYN) 375 MG tablet Take 1 tablet (375 mg total) by mouth every 12 (twelve) hours as needed for moderate pain or headache. 07/21/14  Yes Shuvon B Rankin, NP  Norgestimate-Ethinyl Estradiol Triphasic 0.18/0.215/0.25 MG-35 MCG tablet Take 1 tablet by mouth daily.    Yes Historical Provider, MD  QUEtiapine (SEROQUEL) 300 MG tablet Take 1 tablet (300 mg total) by mouth at bedtime. 07/21/14  Yes Shuvon B Rankin, NP  sertraline (ZOLOFT) 50 MG tablet Take 3 tablets (150 mg total) by mouth daily. 07/21/14  Yes Shuvon B Rankin, NP  hydrOXYzine (ATARAX/VISTARIL) 25 MG tablet Take 1 tablet (25 mg total) by mouth 3 (three) times daily as needed for anxiety. Patient not taking: Reported on 10/06/2014 07/21/14   Shuvon B Rankin, NP   BP 131/90 mmHg  Pulse 80  Temp(Src) 98.3 F (36.8 C) (Oral)  Resp 16  Ht 5\' 5"  (1.651 m)  Wt 209 lb (94.802 kg)  BMI 34.78 kg/m2  SpO2 100%  LMP 11/18/2014 Physical  Exam  Constitutional: She appears well-developed and well-nourished. No distress.  HENT:  Head: Normocephalic and atraumatic.  Eyes: Conjunctivae are normal. Right eye exhibits no discharge. Left eye exhibits no discharge. No scleral icterus.  Neck: Normal range of motion.  Cardiovascular: Normal rate, regular rhythm, normal heart sounds and intact distal pulses.   Pulmonary/Chest: Effort normal and breath sounds normal. No respiratory distress.  She has no wheezes. She has no rales.  Abdominal: Soft. She exhibits no distension. There is tenderness. There is no rebound and no guarding.  Mild tenderness over the lower abdomen. No rebound or guarding. No CVA tenderness  Genitourinary: Uterus normal. There is no rash or lesion on the right labia. There is no rash or lesion on the left labia. Cervix exhibits discharge (white, thick). Cervix exhibits no motion tenderness and no friability. Right adnexum displays no mass and no tenderness. Left adnexum displays no mass and no tenderness. There is bleeding (menstrual) in the vagina. No erythema in the vagina. Vaginal discharge found.  Musculoskeletal: Normal range of motion. She exhibits no edema or tenderness.  Moves all extremities spontaneously. Walks with a steady gait. Full range of motion of lumbar spine intact without pain. No pain to palpation over lumbar region. No point tenderness over lumbar spine. No deformities or step-offs.  Neurological: She is alert. Coordination normal.  Skin: Skin is warm and dry.  Psychiatric: She has a normal mood and affect. Her behavior is normal.  Nursing note and vitals reviewed.   ED Course  Procedures (including critical care time) Labs Review Labs Reviewed  WET PREP, GENITAL - Abnormal; Notable for the following:    WBC, Wet Prep HPF POC TOO NUMEROUS TO COUNT (*)    All other components within normal limits  COMPREHENSIVE METABOLIC PANEL - Abnormal; Notable for the following:    Glucose, Bld 100 (*)    Total Protein 6.3 (*)    Albumin 2.9 (*)    All other components within normal limits  URINALYSIS, ROUTINE W REFLEX MICROSCOPIC (NOT AT [facility]) - Abnormal; Notable for the following:    Hgb urine dipstick MODERATE (*)    All other components within normal limits  URINE MICROSCOPIC-ADD ON - Abnormal; Notable for the following:    Squamous Epithelial / LPF FEW (*)    All other components within normal limits  LIPASE, BLOOD  CBC  POC URINE PREG,  ED  GC/CHLAMYDIA PROBE AMP ([place]) NOT AT [facility]    Imaging Review No results found. I have personally reviewed and evaluated these images and lab results as part of my medical decision-making.   EKG Interpretation None      MDM   Final diagnoses:  Lower abdominal pain  Nausea and vomiting, vomiting of unspecified type    Patient presenting with abdominal pain that started today. It is described as "sharp and stabbing" in her lower abdomen that radiates into her lower back. Associated symptoms include nausea, vomiting and diarrhea. She denies fever, chills, dysuria, hematuria or vaginal discharge. Vital signs stable. Patient is nontoxic, nonseptic appearing. Abdomen is soft with mild tenderness over the lower quadrants. No tenderness of the lumbar region. No CVA tenderness. On pelvic, copious white discharge noted from the cervix. No CMT or friability. Patient's blood work is unremarkable. She has moderate hemoglobin on dipstick but reports that she is on her period. She has too numerous to count white blood cells on her wet prep without yeast, trichomoniasis or clue cells. Discussed possible STD exposure with  patient who vehemently denies. Discussed the benefits and risks of abdominal CT scan and patient wishes to move forward with a scan. Discussed with patient that if the scan is negative then she likely has a viral gastroenteritis and can be treated symptomatically. Patient care is signed out to Elpidio Anis at shift change pending CT scan results.    Alveta Heimlich, PA-C 11/21/14 1610  Shon Baton, MD 11/21/14 2252

## 2014-11-21 ENCOUNTER — Emergency Department (HOSPITAL_COMMUNITY): Payer: Medicaid Other

## 2014-11-21 MED ORDER — IOHEXOL 300 MG/ML  SOLN
100.0000 mL | Freq: Once | INTRAMUSCULAR | Status: AC | PRN
  Administered 2014-11-21: 100 mL via INTRAVENOUS

## 2014-11-21 MED ORDER — ACETAMINOPHEN 325 MG PO TABS
650.0000 mg | ORAL_TABLET | Freq: Once | ORAL | Status: AC
  Administered 2014-11-21: 650 mg via ORAL
  Filled 2014-11-21: qty 2

## 2014-11-21 MED ORDER — ONDANSETRON HCL 4 MG PO TABS: 4.0000 mg | ORAL_TABLET | Freq: Four times a day (QID) | ORAL | Status: DC

## 2014-11-21 NOTE — ED Provider Notes (Signed)
Sharp abd pain since this morning No fever N, V, D Lower abd pain into the back. Worse with movement.  Pelvic - TNTC WBCs Patient insisted on CT of her abdomen which is pending  CT negative.  Patient can be discharged home as viral gastroenteritis.   Elpidio AnisShari Kyair Ditommaso, PA-C 11/21/14 [phone]  Shon Batonourtney F Horton, MD 11/21/14 2250

## 2014-11-21 NOTE — Discharge Instructions (Signed)

## 2014-11-22 LAB — GC/CHLAMYDIA PROBE AMP (~~LOC~~) NOT AT ARMC
Chlamydia: NEGATIVE
NEISSERIA GONORRHEA: NEGATIVE

## 2014-11-27 ENCOUNTER — Encounter (HOSPITAL_COMMUNITY): Payer: Self-pay | Admitting: Emergency Medicine

## 2014-11-27 ENCOUNTER — Emergency Department (HOSPITAL_COMMUNITY)
Admission: EM | Admit: 2014-11-27 | Discharge: 2014-11-27 | Disposition: A | Discharge status: Home / Self Care | Payer: Medicaid Other | Attending: Emergency Medicine | Admitting: Emergency Medicine

## 2014-11-27 DIAGNOSIS — Z880 Allergy status to penicillin: Secondary | ICD-10-CM | POA: Insufficient documentation

## 2014-11-27 DIAGNOSIS — Z79899 Other long term (current) drug therapy: Secondary | ICD-10-CM | POA: Insufficient documentation

## 2014-11-27 DIAGNOSIS — K1379 Other lesions of oral mucosa: Secondary | ICD-10-CM | POA: Diagnosis not present

## 2014-11-27 DIAGNOSIS — F909 Attention-deficit hyperactivity disorder, unspecified type: Secondary | ICD-10-CM | POA: Insufficient documentation

## 2014-11-27 DIAGNOSIS — R21 Rash and other nonspecific skin eruption: Secondary | ICD-10-CM | POA: Diagnosis not present

## 2014-11-27 DIAGNOSIS — F319 Bipolar disorder, unspecified: Secondary | ICD-10-CM | POA: Diagnosis not present

## 2014-11-27 DIAGNOSIS — Z7982 Long term (current) use of aspirin: Secondary | ICD-10-CM | POA: Insufficient documentation

## 2014-11-27 DIAGNOSIS — J029 Acute pharyngitis, unspecified: Secondary | ICD-10-CM | POA: Diagnosis present

## 2014-11-27 DIAGNOSIS — K121 Other forms of stomatitis: Secondary | ICD-10-CM

## 2014-11-27 NOTE — ED Notes (Signed)
Pt. reports sore throat with swelling and occasional dry cough onset yesterday , denies fever or chills , airway intact / respirations unlabored .

## 2014-11-27 NOTE — ED Provider Notes (Signed)
CSN: [id number]     Arrival date & time 11/27/14  0012 History   First MD Initiated Contact with Patient 11/27/14 0041     Chief Complaint  Patient presents with  . Sore Throat     (Consider location/radiation/quality/duration/timing/severity/associated sxs/prior Treatment) Patient is a 19 y.o. female presenting with pharyngitis. The history is provided by the patient. No language interpreter was used.  Sore Throat This is a new problem. The current episode started yesterday. The problem occurs constantly. The problem has been unchanged. Associated symptoms include a rash and a sore throat. Pertinent negatives include no fever. The symptoms are aggravated by swallowing. She has tried nothing for the symptoms.    Past Medical History  Diagnosis Date  . Bipolar disorder (HCC)   . ADHD (attention deficit hyperactivity disorder)   . Depression    Past Surgical History  Procedure Laterality Date  . Tonsillectomy     No family history on file. Social History  Substance Use Topics  . Smoking status: Never Smoker   . Smokeless tobacco: None  . Alcohol Use: Yes     Comment: occ   OB History    No data available     Review of Systems  Constitutional: Negative for fever.  HENT: Positive for sore throat.   Skin: Positive for rash.  All other systems reviewed and are negative.     Allergies  Penicillins  Home Medications   Prior to Admission medications   Medication Sig Start Date End Date Taking? Authorizing Provider  Aspirin-Acetaminophen-Caffeine (GOODY HEADACHE PO) Take 1 Package by mouth daily as needed (FOR MIGRAINES).   Yes Historical Provider, MD  divalproex (DEPAKOTE ER) 500 MG 24 hr tablet Take 2 tablets (1,000 mg total) by mouth at bedtime. 07/21/14  Yes Shuvon B Rankin, NP  naproxen (NAPROSYN) 375 MG tablet Take 1 tablet (375 mg total) by mouth every 12 (twelve) hours as needed for moderate pain or headache. 07/21/14  Yes Shuvon B Rankin, NP  Norgestimate-Ethinyl  Estradiol Triphasic 0.18/0.215/0.25 MG-35 MCG tablet Take 1 tablet by mouth daily.    Yes Historical Provider, MD  ondansetron (ZOFRAN) 4 MG tablet Take 1 tablet (4 mg total) by mouth every 6 (six) hours. 11/21/14  Yes Shari Upstill, PA-C  QUEtiapine (SEROQUEL) 300 MG tablet Take 1 tablet (300 mg total) by mouth at bedtime. 07/21/14  Yes Shuvon B Rankin, NP  sertraline (ZOLOFT) 50 MG tablet Take 3 tablets (150 mg total) by mouth daily. 07/21/14  Yes Shuvon B Rankin, NP  hydrOXYzine (ATARAX/VISTARIL) 25 MG tablet Take 1 tablet (25 mg total) by mouth 3 (three) times daily as needed for anxiety. Patient not taking: Reported on 10/06/2014 07/21/14   Shuvon B Rankin, NP   BP 128/86 mmHg  Pulse 98  Temp(Src) 99.4 F (37.4 C) (Oral)  Resp 16  Ht  (1.651 m)  Wt 209 lb (94.802 kg)  BMI 34.78 kg/m2  SpO2 98%  LMP 11/18/2014 Physical Exam  Constitutional: She appears well-developed and well-nourished.  HENT:  Right Ear: External ear normal.  Left Ear: External ear normal.  Ulcers noted to the roof of the mouth.   Eyes: Conjunctivae and EOM are normal. Pupils are equal, round, and reactive to light.  Cardiovascular: Normal rate and regular rhythm.   Pulmonary/Chest: Effort normal and breath sounds normal.  Musculoskeletal: Normal range of motion.  Neurological: She is alert.  Nursing note and vitals reviewed.   ED Course  Procedures (including critical care time) Labs Review  Labs Reviewed - No data to display  Imaging Review No results found. I have personally reviewed and evaluated these images and lab results as part of my medical decision-making.   EKG Interpretation None      MDM   Final diagnoses:  Mouth ulcers    Discussed symptomatic treatment with pt.     Teressa LowerVrinda Jaimere Feutz, NP 11/27/14 [id number]  Shon Batonourtney F Horton, MD 11/27/14 [phone]

## 2014-11-27 NOTE — Discharge Instructions (Signed)
Take tylenol or motrin for pain Oral Ulcers Oral ulcers are painful, shallow sores around the lining of the mouth. They can affect the gums, the inside of the lips, and the cheeks. (Sores on the outside of the lips and on the face are different.) They typically first occur in school-aged children and teenagers. Oral ulcers may also be called canker sores or cold sores. CAUSES  Canker sores and cold sores can be caused by many factors including:  Infection.  Injury.  Sun exposure.  Medications.  Emotional stress.  Food allergies.  Vitamin deficiencies.  Toothpastes containing sodium lauryl sulfate. The herpes virus can be the cause of mouth ulcers. The first infection can be severe and cause 10 or more ulcers on the gums, tongue, and lips with fever and difficulty in swallowing. This infection usually occurs between the ages of 1 and 3 years.  SYMPTOMS  The typical sore is about  inch (6 mm) in size and is an oval or round ulcer with red borders. DIAGNOSIS  Your caregiver can diagnose simple oral ulcers by examination. Additional testing is usually not required.  TREATMENT  Treatment is aimed at pain relief. Generally, oral ulcers resolve by themselves within 1 to 2 weeks without medication and are not contagious unless caused by herpes (and other viruses). Antibiotics are not effective with mouth sores. Avoid direct contact with others until the ulcer is completely healed. See your caregiver for follow-up care as recommended. Also:  Offer a soft diet.  Encourage plenty of fluids to prevent dehydration. Popsicles and milk shakes can be helpful.  Avoid acidic and salty foods and drinks such as orange juice.  Infants and young children will often refuse to drink because of pain. Using a teaspoon, cup, or syringe to give small amounts of fluids frequently can help prevent dehydration.  Cold compresses on the face may help reduce pain.  Pain medication can help control  soreness.  A solution of diphenhydramine mixed with a liquid antacid can be useful to decrease the soreness of ulcers. Consult a caregiver for the dosing.  Liquids or ointments with a numbing ingredient may be helpful when used as recommended.  Older children and teenagers can rinse their mouth with a salt-water mixture (1/2 teaspoon of salt in 8 ounces of water) four times a day. This treatment is uncomfortable but may reduce the time the ulcers are present.  There are many over-the-counter throat lozenges and medications available for oral ulcers. Their effectiveness has not been studied.  Consult your medical caregiver prior to using homeopathic treatments for oral ulcers. SEEK MEDICAL CARE IF:   You think your child needs to be seen.  The pain worsens and you cannot control it.  There are 4 or more ulcers.  The lips and gums begin to bleed and crust.  A single mouth ulcer is near a tooth that is causing a toothache or pain.  Your child has a fever, swollen face, or swollen glands.  The ulcers began after starting a medication.  Mouth ulcers keep reoccurring or last more than 2 weeks.  You think your child is not taking adequate fluids. SEEK IMMEDIATE MEDICAL CARE IF:   Your child has a high fever.  Your child is unable to swallow or becomes dehydrated.  Your child looks or acts very ill.  An ulcer caused by a chemical your child accidentally put in their mouth.   This information is not intended to replace advice given to you by your health care  provider. Make sure you discuss any questions you have with your health care provider.   Document Released: 02/02/2004 Document Revised: 01/15/2014 Document Reviewed: 05/12/2014 Elsevier Interactive Patient Education [employer]2016 Elsevier Inc.

## 2015-03-08 ENCOUNTER — Emergency Department (HOSPITAL_COMMUNITY): Payer: Medicaid Other

## 2015-03-08 ENCOUNTER — Encounter (HOSPITAL_COMMUNITY): Payer: Self-pay | Admitting: Neurology

## 2015-03-08 ENCOUNTER — Emergency Department (HOSPITAL_COMMUNITY)
Admission: EM | Admit: 2015-03-08 | Discharge: 2015-03-09 | Disposition: A | Discharge status: Home / Self Care | Payer: Medicaid Other | Attending: Emergency Medicine | Admitting: Emergency Medicine

## 2015-03-08 DIAGNOSIS — Z880 Allergy status to penicillin: Secondary | ICD-10-CM | POA: Diagnosis not present

## 2015-03-08 DIAGNOSIS — Z7982 Long term (current) use of aspirin: Secondary | ICD-10-CM | POA: Diagnosis not present

## 2015-03-08 DIAGNOSIS — Z793 Long term (current) use of hormonal contraceptives: Secondary | ICD-10-CM | POA: Insufficient documentation

## 2015-03-08 DIAGNOSIS — K5900 Constipation, unspecified: Secondary | ICD-10-CM | POA: Insufficient documentation

## 2015-03-08 DIAGNOSIS — F319 Bipolar disorder, unspecified: Secondary | ICD-10-CM | POA: Diagnosis not present

## 2015-03-08 DIAGNOSIS — Z79899 Other long term (current) drug therapy: Secondary | ICD-10-CM | POA: Insufficient documentation

## 2015-03-08 LAB — BASIC METABOLIC PANEL
ANION GAP: 9 (ref 5–15)
BUN: 11 mg/dL (ref 6–20)
CALCIUM: 9.5 mg/dL (ref 8.9–10.3)
CO2: 26 mmol/L (ref 22–32)
Chloride: 104 mmol/L (ref 101–111)
Creatinine, Ser: 0.56 mg/dL (ref 0.44–1.00)
GLUCOSE: 86 mg/dL (ref 65–99)
POTASSIUM: 3.8 mmol/L (ref 3.5–5.1)
Sodium: 139 mmol/L (ref 135–145)

## 2015-03-08 LAB — CBC WITH DIFFERENTIAL/PLATELET
BASOS ABS: 0.1 10*3/uL (ref 0.0–0.1)
BASOS PCT: 1 %
Eosinophils Absolute: 0.2 10*3/uL (ref 0.0–0.7)
Eosinophils Relative: 2 %
HEMATOCRIT: 39.1 % (ref 36.0–46.0)
Hemoglobin: 13.1 g/dL (ref 12.0–15.0)
LYMPHS PCT: 50 %
Lymphs Abs: 4.6 10*3/uL — ABNORMAL HIGH (ref 0.7–4.0)
MCH: 27.3 pg (ref 26.0–34.0)
MCHC: 33.5 g/dL (ref 30.0–36.0)
MCV: 81.5 fL (ref 78.0–100.0)
MONO ABS: 1 10*3/uL (ref 0.1–1.0)
Monocytes Relative: 11 %
NEUTROS ABS: 3.3 10*3/uL (ref 1.7–7.7)
NEUTROS PCT: 36 %
Platelets: 302 10*3/uL (ref 150–400)
RBC: 4.8 MIL/uL (ref 3.87–5.11)
RDW: 12.7 % (ref 11.5–15.5)
WBC: 9.2 10*3/uL (ref 4.0–10.5)

## 2015-03-08 MED ORDER — ONDANSETRON 4 MG PO TBDP
4.0000 mg | ORAL_TABLET | Freq: Once | ORAL | Status: AC
  Administered 2015-03-08: 4 mg via ORAL
  Filled 2015-03-08: qty 1

## 2015-03-08 MED ORDER — FLEET ENEMA 7-19 GM/118ML RE ENEM
1.0000 | ENEMA | Freq: Once | RECTAL | Status: DC
  Filled 2015-03-08: qty 1

## 2015-03-08 NOTE — ED Notes (Signed)
Pt reports constipation, LBM was 2 weeks ago. Has tried laxatives without relief. Pt is a x 4.

## 2015-03-08 NOTE — ED Provider Notes (Signed)
CSN: [id number]     Arrival date & time 03/08/15  1844 History   First MD Initiated Contact with Patient 03/08/15 2212     Chief Complaint  Patient presents with  . Constipation   HPI  [name] is a 20 year old female with PMHx of ADHD and bipolar disorder presenting with constipation. She states she has not had a full bowel movement in the past 2 weeks. She states she has had a few very small, hard bowel movements but she still feels constipated afterwards. She endorses increased straining. Over the past few days, she has developed generalized abdominal cramping. She has tried to have bowel movements to relieve this pain but has had no success. She is also endorsing nausea. She states that her appetite is somewhat decreased but she forces herself to eat. She denies episodes of vomiting. She is still passing gas. She has used over-the-counter medications such as milk of magnesia, stool softeners and laxatives. She denies a history of constipation. She denies narcotic medication use. Denies history of abdominal surgeries. Denies fevers, chills, headaches, dizziness, syncope, chest pain, shortness of breath, myalgias or joint swelling. She has no other complaints today.  Past Medical History  Diagnosis Date  . Bipolar disorder (HCC)   . ADHD (attention deficit hyperactivity disorder)   . Depression    Past Surgical History  Procedure Laterality Date  . Tonsillectomy     No family history on file. Social History  Substance Use Topics  . Smoking status: Never Smoker   . Smokeless tobacco: None  . Alcohol Use: Yes     Comment: occ   OB History    No data available     Review of Systems  Gastrointestinal: Positive for constipation.  All other systems reviewed and are negative.     Allergies  Penicillins  Home Medications   Prior to Admission medications   Medication Sig Start Date End Date Taking? Authorizing Provider  Aspirin-Acetaminophen-Caffeine (GOODY HEADACHE PO) Take 1  Package by mouth daily as needed (FOR MIGRAINES).   Yes Historical Provider, MD  divalproex (DEPAKOTE ER) 500 MG 24 hr tablet Take 2 tablets (1,000 mg total) by mouth at bedtime. 07/21/14  Yes Shuvon B Rankin, NP  Norgestimate-Ethinyl Estradiol Triphasic 0.18/0.215/0.25 MG-35 MCG tablet Take 1 tablet by mouth daily.    Yes Historical Provider, MD  QUEtiapine (SEROQUEL) 300 MG tablet Take 1 tablet (300 mg total) by mouth at bedtime. 07/21/14  Yes Shuvon B Rankin, NP  sertraline (ZOLOFT) 50 MG tablet Take 3 tablets (150 mg total) by mouth daily. 07/21/14  Yes Shuvon B Rankin, NP  hydrOXYzine (ATARAX/VISTARIL) 25 MG tablet Take 1 tablet (25 mg total) by mouth 3 (three) times daily as needed for anxiety. Patient not taking: Reported on 10/06/2014 07/21/14   Shuvon B Rankin, NP  naproxen (NAPROSYN) 375 MG tablet Take 1 tablet (375 mg total) by mouth every 12 (twelve) hours as needed for moderate pain or headache. Patient not taking: Reported on 03/08/2015 07/21/14   Shuvon B Rankin, NP  ondansetron (ZOFRAN) 4 MG tablet Take 1 tablet (4 mg total) by mouth every 6 (six) hours. Patient not taking: Reported on 03/08/2015 11/21/14   Elpidio Anis, PA-C   BP 132/90 mmHg  Pulse 94  Temp(Src) 97.9 F (36.6 C) (Oral)  Resp 16  SpO2 100%  LMP 02/22/2015 Physical Exam  Constitutional: She appears well-developed and well-nourished. No distress.  Nontoxic-appearing.  HENT:  Head: Normocephalic and atraumatic.  Eyes: Conjunctivae are  normal. Right eye exhibits no discharge. Left eye exhibits no discharge. No scleral icterus.  Neck: Normal range of motion.  Cardiovascular: Normal rate, regular rhythm and normal heart sounds.   Pulmonary/Chest: Effort normal and breath sounds normal. No respiratory distress.  Abdominal: Soft. She exhibits no distension. Bowel sounds are decreased. There is generalized tenderness. There is no rigidity, no rebound and no guarding.  Mild generalized tenderness. Hypoactive bowel sounds.  No rebound, guarding or rigidity.  Musculoskeletal: Normal range of motion.  Neurological: She is alert. Coordination normal.  Skin: Skin is warm and dry.  Psychiatric: She has a normal mood and affect. Her behavior is normal.  Nursing note and vitals reviewed.   ED Course  Procedures (including critical care time) Labs Review Labs Reviewed  CBC WITH DIFFERENTIAL/PLATELET - Abnormal; Notable for the following:    Lymphs Abs 4.6 (*)    All other components within normal limits  BASIC METABOLIC PANEL    Imaging Review Dg Abd Acute W/chest  03/08/2015  CLINICAL DATA:  20 year old female with constipation and abdominal pain, and nausea. EXAM: DG ABDOMEN ACUTE W/ 1V CHEST COMPARISON:  CT dated 11/21/2014 FINDINGS: There is moderate stool throughout the colon. No evidence of bowel obstruction. No free air. No radiopaque calculi. The lungs are clear. No pleural effusion or pneumothorax. The cardiac silhouette is within normal limits. The osseous structures and soft tissues are grossly unremarkable. IMPRESSION: Moderate stool throughout the colon.  No bowel obstruction. Electronically Signed   By: Elgie Collard M.D.   On: 03/08/2015 23:14   I have personally reviewed and evaluated these images and lab results as part of my medical decision-making.   EKG Interpretation None      MDM   Final diagnoses:  Constipation, unspecified constipation type   20 year old female presenting with constipation 2 weeks. Associated nausea without vomiting. Patient is still passing gas. Afebrile and nontoxic appearing. Abdomen is soft with generalized tenderness. Hypoactive bowel sounds. No abnormality on blood work. DG abdomen without bowel obstruction. Ordered bottle of mag citrate in ED. Will discharge with instructions for Miralax use if constipation is not fully resolved. Pt is to follow up with her PCP if constipation persists. Patient will be discharged by Dr. Judd Lien after bowel movement.      Rolm Gala Jair Lindblad, PA-C 03/09/15 0127  Cathren Laine, MD 03/10/15 [phone]

## 2015-03-08 NOTE — ED Notes (Signed)
Pt reports she has not had a BM of any type in 2 weeks. She has tried milk of magnesia, laxatives, and fruits and vegetables with no relief.

## 2015-03-08 NOTE — ED Notes (Signed)
Pt ambulatory to bathroom with steady gait, NAD

## 2015-03-09 MED ORDER — MAGNESIUM CITRATE PO SOLN
1.0000 | Freq: Once | ORAL | Status: AC
  Administered 2015-03-09: 1 via ORAL
  Filled 2015-03-09: qty 296

## 2015-03-09 NOTE — Discharge Instructions (Signed)
Increase the fiber in your diet. Buy OTC Miralax if your constipation does not fully resolve. Take 6 capfuls of miralax in a 32 oz gatorade and drink the whole beverage followed by 3 capfuls twice a day for the next week. Schedule a follow up appointment with your PCP if you develop recurrent constipation.    Constipation, Adult Constipation is when a person has fewer than three bowel movements a week, has difficulty having a bowel movement, or has stools that are dry, hard, or larger than normal. As people grow older, constipation is more common. A low-fiber diet, not taking in enough fluids, and taking certain medicines may make constipation worse.  CAUSES   Certain medicines, such as antidepressants, pain medicine, iron supplements, antacids, and water pills.   Certain diseases, such as diabetes, irritable bowel syndrome (IBS), thyroid disease, or depression.   Not drinking enough water.   Not eating enough fiber-rich foods.   Stress or travel.   Lack of physical activity or exercise.   Ignoring the urge to have a bowel movement.   Using laxatives too much.  SIGNS AND SYMPTOMS   Having fewer than three bowel movements a week.   Straining to have a bowel movement.   Having stools that are hard, dry, or larger than normal.   Feeling full or bloated.   Pain in the lower abdomen.   Not feeling relief after having a bowel movement.  DIAGNOSIS  Your health care provider will take a medical history and perform a physical exam. Further testing may be done for severe constipation. Some tests may include:  A barium enema X-ray to examine your rectum, colon, and, sometimes, your small intestine.   A sigmoidoscopy to examine your lower colon.   A colonoscopy to examine your entire colon. TREATMENT  Treatment will depend on the severity of your constipation and what is causing it. Some dietary treatments include drinking more fluids and eating more fiber-rich foods.  Lifestyle treatments may include regular exercise. If these diet and lifestyle recommendations do not help, your health care provider may recommend taking over-the-counter laxative medicines to help you have bowel movements. Prescription medicines may be prescribed if over-the-counter medicines do not work.  HOME CARE INSTRUCTIONS   Eat foods that have a lot of fiber, such as fruits, vegetables, whole grains, and beans.  Limit foods high in fat and processed sugars, such as french fries, hamburgers, cookies, candies, and soda.   A fiber supplement may be added to your diet if you cannot get enough fiber from foods.   Drink enough fluids to keep your urine clear or pale yellow.   Exercise regularly or as directed by your health care provider.   Go to the restroom when you have the urge to go. Do not hold it.   Only take over-the-counter or prescription medicines as directed by your health care provider. Do not take other medicines for constipation without talking to your health care provider first.  SEEK IMMEDIATE MEDICAL CARE IF:   You have bright red blood in your stool.   Your constipation lasts for more than 4 days or gets worse.   You have abdominal or rectal pain.   You have thin, pencil-like stools.   You have unexplained weight loss. MAKE SURE YOU:   Understand these instructions.  Will watch your condition.  Will get help right away if you are not doing well or get worse.   This information is not intended to replace advice given to  you by your health care provider. Make sure you discuss any questions you have with your health care provider.   Document Released: 09/23/2003 Document Revised: 01/15/2014 Document Reviewed: 10/06/2012 Elsevier Interactive Patient Education [employer].

## 2015-03-23 ENCOUNTER — Emergency Department (HOSPITAL_COMMUNITY)
Admission: EM | Admit: 2015-03-23 | Discharge: 2015-03-23 | Disposition: A | Discharge status: Home / Self Care | Payer: Medicaid Other | Attending: Emergency Medicine | Admitting: Emergency Medicine

## 2015-03-23 ENCOUNTER — Emergency Department (HOSPITAL_COMMUNITY): Payer: Medicaid Other

## 2015-03-23 ENCOUNTER — Encounter (HOSPITAL_COMMUNITY): Payer: Self-pay | Admitting: Emergency Medicine

## 2015-03-23 DIAGNOSIS — M79674 Pain in right toe(s): Secondary | ICD-10-CM | POA: Insufficient documentation

## 2015-03-23 DIAGNOSIS — Z880 Allergy status to penicillin: Secondary | ICD-10-CM | POA: Insufficient documentation

## 2015-03-23 DIAGNOSIS — Z79899 Other long term (current) drug therapy: Secondary | ICD-10-CM | POA: Diagnosis not present

## 2015-03-23 DIAGNOSIS — F319 Bipolar disorder, unspecified: Secondary | ICD-10-CM | POA: Insufficient documentation

## 2015-03-23 MED ORDER — IBUPROFEN 800 MG PO TABS: 800.0000 mg | ORAL_TABLET | Freq: Three times a day (TID) | ORAL | Status: DC

## 2015-03-23 MED ORDER — IBUPROFEN 400 MG PO TABS
800.0000 mg | ORAL_TABLET | Freq: Once | ORAL | Status: AC
  Administered 2015-03-23: 800 mg via ORAL
  Filled 2015-03-23: qty 2

## 2015-03-23 NOTE — Discharge Instructions (Signed)
Take your medications as prescribed. I recommend eating prior to taking her ibuprofen to prevent GI side effects. I recommend resting, elevating and icing your right foot and ankle for 15-20 minutes 3-4 times daily to help with pain. Follow-up with your primary care provider's office in one week if your symptoms have not improved. Please return to the Emergency Department if symptoms worsen or new onset of fever, redness, swelling, numbness, tingling, weakness.

## 2015-03-23 NOTE — ED Notes (Signed)
Pt reports R foot pain after kicking brick wall with steel toe boots. Pt has minimal bruising with mild swelling. Reports pain around ankle area. Good circulation

## 2015-03-23 NOTE — ED Notes (Signed)
See EDP assessment 

## 2015-03-23 NOTE — ED Provider Notes (Signed)
CSN: [id number]     Arrival date & time 03/23/15  2043 History  By signing my name below, I, Ronney Lion, attest that this documentation has been prepared under the direction and in the presence of Melburn Hake, [place]. Electronically Signed: Ronney Lion, ED Scribe. 03/23/2015. 9:19 PM.    Chief Complaint  Patient presents with  . Foot Pain   The history is provided by the patient. No language interpreter was used.    HPI Comments: Jordan Hanson is a 20 y.o. female who presents to the Emergency Department complaining of sudden-onset, constant, worsening, throbbing, 8/10 right foot and ankle pain after kicking a brick wall while wearing steel-toed shoes earlier today due to anger. She notes occasional associated numbness to her right foot. She denies any other injuries. Walking and movement exacerbate her pain. Patient has taken ibuprofen at home (last taken at 10 AM) with minimal relief. She denies tingling, Swelling, redness, weakness.   Past Medical History  Diagnosis Date  . Bipolar disorder (HCC)   . ADHD (attention deficit hyperactivity disorder)   . Depression    Past Surgical History  Procedure Laterality Date  . Tonsillectomy     No family history on file. Social History  Substance Use Topics  . Smoking status: Never Smoker   . Smokeless tobacco: None  . Alcohol Use: Yes     Comment: occ   OB History    No data available     Review of Systems  Musculoskeletal: Positive for arthralgias (right foot pain).  Neurological: Positive for numbness.   Allergies  Penicillins  Home Medications   Prior to Admission medications   Medication Sig Start Date End Date Taking? Authorizing Provider  Aspirin-Acetaminophen-Caffeine (GOODY HEADACHE PO) Take 1 Package by mouth daily as needed (FOR MIGRAINES).    Historical Provider, MD  divalproex (DEPAKOTE ER) 500 MG 24 hr tablet Take 2 tablets (1,000 mg total) by mouth at bedtime. 07/21/14   Shuvon B Rankin, NP  hydrOXYzine  (ATARAX/VISTARIL) 25 MG tablet Take 1 tablet (25 mg total) by mouth 3 (three) times daily as needed for anxiety. Patient not taking: Reported on 10/06/2014 07/21/14   Shuvon B Rankin, NP  ibuprofen (ADVIL,MOTRIN) 800 MG tablet Take 1 tablet (800 mg total) by mouth 3 (three) times daily. 03/23/15   Barrett Henle, PA-C  naproxen (NAPROSYN) 375 MG tablet Take 1 tablet (375 mg total) by mouth every 12 (twelve) hours as needed for moderate pain or headache. Patient not taking: Reported on 03/08/2015 07/21/14   Shuvon B Rankin, NP  Norgestimate-Ethinyl Estradiol Triphasic 0.18/0.215/0.25 MG-35 MCG tablet Take 1 tablet by mouth daily.     Historical Provider, MD  ondansetron (ZOFRAN) 4 MG tablet Take 1 tablet (4 mg total) by mouth every 6 (six) hours. Patient not taking: Reported on 03/08/2015 11/21/14   Elpidio Anis, PA-C  QUEtiapine (SEROQUEL) 300 MG tablet Take 1 tablet (300 mg total) by mouth at bedtime. 07/21/14   Shuvon B Rankin, NP  sertraline (ZOLOFT) 50 MG tablet Take 3 tablets (150 mg total) by mouth daily. 07/21/14   Shuvon B Rankin, NP   BP 125/110 mmHg  Pulse 110  Resp 18  SpO2 98%  LMP 02/22/2015 Physical Exam  Constitutional: She is oriented to person, place, and time. She appears well-developed and well-nourished.  HENT:  Head: Normocephalic and atraumatic.  Eyes: Conjunctivae and EOM are normal. Right eye exhibits no discharge. Left eye exhibits no discharge. No scleral icterus.  Neck: Normal range  of motion. Neck supple.  Cardiovascular: Normal rate.   HR 96  Pulmonary/Chest: Effort normal. No respiratory distress.  Musculoskeletal: Normal range of motion.       Right ankle: Normal. She exhibits normal range of motion, no swelling, no ecchymosis, no deformity, no laceration and normal pulse. No tenderness.       Right foot: There is tenderness. There is normal range of motion, no swelling, normal capillary refill, no crepitus, no deformity and no laceration.  FROM of right  knee, ankle, and foot. Tenderness to palpation over right great toe, with small amount of ecchymosis noted to medial aspect of first MTP. 5/5 strength. Sensation intact. Cap refill <2 seconds. 2+ DP pulses. Patient able to stand and ambulate.   Neurological: She is alert and oriented to person, place, and time.  Nursing note and vitals reviewed.   ED Course  Procedures (including critical care time)  DIAGNOSTIC STUDIES: Oxygen Saturation is 98% on RA, normal by my interpretation.    COORDINATION OF CARE: 9:07 PM - Discussed treatment plan with pt at bedside which includes right foot XR. Pt verbalized understanding and agreed to plan.   Imaging Review Dg Toe Great Right  03/23/2015  CLINICAL DATA:  Kicked brick wall with right great toe pain. Initial encounter. EXAM: RIGHT GREAT TOE COMPARISON:  None. FINDINGS: There is no evidence of fracture or dislocation. Bipartite sesamoids with smooth corticated lucencies. IMPRESSION: Negative. Electronically Signed   By: Marnee SpringJonathon  Watts M.D.   On: 03/23/2015 21:56   I have personally reviewed and evaluated these images and lab results as part of my medical decision-making.  MDM   Final diagnoses:  Pain of toe of right foot   Patient presents with pain to right great toe and right ankle after kicking a brick wall. VSS. Exam revealed tenderness over right great toe with small ecchymosis noted to medial aspect of first MTP joint, remaining exam unremarkable, right lower extremity neurovascularly intact. Pt is able ambulate, no bony abnormality or deformity, no erythema or excessive heat, no evidence of cellulitis or septic joint. Patient given ibuprofen in the ED. Right toe x-ray negative. Discussed results and plan for discharge with patient. Plan to discharge patient home with NSAIDs, symptomatic treatment and RICE protocol. Advised patient to follow up with her PCP if her symptoms have not improved over the next week.  I personally performed the  services described in this documentation, which was scribed in my presence. The recorded information has been reviewed and is accurate.     Satira Sarkicole Elizabeth LucedaleNadeau, [place]PA-C 03/23/15 2218  Marily MemosJason Mesner, MD 03/23/15 [phone]

## 2015-06-28 ENCOUNTER — Emergency Department (HOSPITAL_COMMUNITY)
Admission: EM | Admit: 2015-06-28 | Discharge: 2015-06-28 | Disposition: A | Discharge status: Home / Self Care | Payer: Medicaid Other | Attending: Dermatology | Admitting: Dermatology

## 2015-06-28 ENCOUNTER — Encounter (HOSPITAL_COMMUNITY): Payer: Self-pay | Admitting: *Deleted

## 2015-06-28 DIAGNOSIS — S0592XA Unspecified injury of left eye and orbit, initial encounter: Secondary | ICD-10-CM | POA: Insufficient documentation

## 2015-06-28 DIAGNOSIS — R1110 Vomiting, unspecified: Secondary | ICD-10-CM | POA: Diagnosis not present

## 2015-06-28 DIAGNOSIS — Y939 Activity, unspecified: Secondary | ICD-10-CM | POA: Diagnosis not present

## 2015-06-28 DIAGNOSIS — Z5321 Procedure and treatment not carried out due to patient leaving prior to being seen by health care provider: Secondary | ICD-10-CM | POA: Diagnosis not present

## 2015-06-28 DIAGNOSIS — R51 Headache: Secondary | ICD-10-CM | POA: Insufficient documentation

## 2015-06-28 DIAGNOSIS — Y929 Unspecified place or not applicable: Secondary | ICD-10-CM | POA: Diagnosis not present

## 2015-06-28 DIAGNOSIS — R197 Diarrhea, unspecified: Secondary | ICD-10-CM | POA: Insufficient documentation

## 2015-06-28 DIAGNOSIS — Y999 Unspecified external cause status: Secondary | ICD-10-CM | POA: Diagnosis not present

## 2015-06-28 LAB — CBC
HEMATOCRIT: 40.4 % (ref 36.0–46.0)
HEMOGLOBIN: 12.8 g/dL (ref 12.0–15.0)
MCH: 26.6 pg (ref 26.0–34.0)
MCHC: 31.7 g/dL (ref 30.0–36.0)
MCV: 84 fL (ref 78.0–100.0)
Platelets: 333 10*3/uL (ref 150–400)
RBC: 4.81 MIL/uL (ref 3.87–5.11)
RDW: 13.1 % (ref 11.5–15.5)
WBC: 9.7 10*3/uL (ref 4.0–10.5)

## 2015-06-28 LAB — COMPREHENSIVE METABOLIC PANEL
ALBUMIN: 3.3 g/dL — AB (ref 3.5–5.0)
ALT: 24 U/L (ref 14–54)
ANION GAP: 8 (ref 5–15)
AST: 21 U/L (ref 15–41)
Alkaline Phosphatase: 53 U/L (ref 38–126)
BUN: 6 mg/dL (ref 6–20)
CALCIUM: 9.5 mg/dL (ref 8.9–10.3)
CHLORIDE: 106 mmol/L (ref 101–111)
CO2: 23 mmol/L (ref 22–32)
Creatinine, Ser: 0.63 mg/dL (ref 0.44–1.00)
GFR calc non Af Amer: >60 mL/min (ref >60)
GLUCOSE: 90 mg/dL (ref 65–99)
POTASSIUM: 3.9 mmol/L (ref 3.5–5.1)
SODIUM: 137 mmol/L (ref 135–145)
Total Bilirubin: 0.4 mg/dL (ref 0.3–1.2)
Total Protein: 6.7 g/dL (ref 6.5–8.1)

## 2015-06-28 LAB — I-STAT BETA HCG BLOOD, ED (MC, WL, AP ONLY)

## 2015-06-28 LAB — LIPASE, BLOOD: LIPASE: 32 U/L (ref 11–51)

## 2015-06-28 NOTE — ED Notes (Signed)
Pt also reports decreased appetite, vomiting, and tired all the time.  Pt reports diarrhea as well.  No abdominal pain.  Reports weight loss..Marland Kitchen

## 2015-06-28 NOTE — ED Notes (Signed)
Unable to locate patient when called
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
## 2015-06-28 NOTE — ED Notes (Signed)
Pt called for vitals. No answer.  

## 2015-08-29 ENCOUNTER — Emergency Department (HOSPITAL_COMMUNITY): Payer: Medicaid Other

## 2015-08-29 ENCOUNTER — Emergency Department (HOSPITAL_COMMUNITY)
Admission: EM | Admit: 2015-08-29 | Discharge: 2015-08-29 | Disposition: A | Discharge status: Home / Self Care | Payer: Medicaid Other | Attending: Emergency Medicine | Admitting: Emergency Medicine

## 2015-08-29 ENCOUNTER — Encounter (HOSPITAL_COMMUNITY): Payer: Self-pay | Admitting: Emergency Medicine

## 2015-08-29 DIAGNOSIS — Z79899 Other long term (current) drug therapy: Secondary | ICD-10-CM | POA: Insufficient documentation

## 2015-08-29 DIAGNOSIS — Y939 Activity, unspecified: Secondary | ICD-10-CM | POA: Insufficient documentation

## 2015-08-29 DIAGNOSIS — S40212A Abrasion of left shoulder, initial encounter: Secondary | ICD-10-CM | POA: Insufficient documentation

## 2015-08-29 DIAGNOSIS — S1091XA Abrasion of unspecified part of neck, initial encounter: Secondary | ICD-10-CM | POA: Insufficient documentation

## 2015-08-29 DIAGNOSIS — Y999 Unspecified external cause status: Secondary | ICD-10-CM | POA: Insufficient documentation

## 2015-08-29 DIAGNOSIS — S60212A Contusion of left wrist, initial encounter: Secondary | ICD-10-CM | POA: Insufficient documentation

## 2015-08-29 DIAGNOSIS — Z791 Long term (current) use of non-steroidal anti-inflammatories (NSAID): Secondary | ICD-10-CM | POA: Insufficient documentation

## 2015-08-29 DIAGNOSIS — Y92410 Unspecified street and highway as the place of occurrence of the external cause: Secondary | ICD-10-CM | POA: Insufficient documentation

## 2015-08-29 DIAGNOSIS — F909 Attention-deficit hyperactivity disorder, unspecified type: Secondary | ICD-10-CM | POA: Insufficient documentation

## 2015-08-29 DIAGNOSIS — Z7982 Long term (current) use of aspirin: Secondary | ICD-10-CM | POA: Insufficient documentation

## 2015-08-29 LAB — POC URINE PREG, ED: PREG TEST UR: NEGATIVE

## 2015-08-29 MED ORDER — IBUPROFEN 800 MG PO TABS
800.0000 mg | ORAL_TABLET | Freq: Once | ORAL | Status: AC
  Administered 2015-08-29: 800 mg via ORAL
  Filled 2015-08-29: qty 1

## 2015-08-29 MED ORDER — IBUPROFEN 800 MG PO TABS: 800.0000 mg | ORAL_TABLET | Freq: Three times a day (TID) | ORAL | 0 refills | Status: DC

## 2015-08-29 NOTE — Discharge Instructions (Signed)
See your Physician for recheck in 1 week if pain persist °
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
## 2015-08-29 NOTE — ED Notes (Signed)
Patient transported to X-ray 
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
## 2015-09-15 ENCOUNTER — Emergency Department (HOSPITAL_COMMUNITY)
Admission: EM | Admit: 2015-09-15 | Discharge: 2015-09-15 | Disposition: A | Discharge status: Home / Self Care | Payer: Medicaid Other | Attending: Emergency Medicine | Admitting: Emergency Medicine

## 2015-09-15 ENCOUNTER — Encounter (HOSPITAL_COMMUNITY): Payer: Self-pay | Admitting: Emergency Medicine

## 2015-09-15 ENCOUNTER — Emergency Department (HOSPITAL_COMMUNITY): Payer: Medicaid Other

## 2015-09-15 DIAGNOSIS — Z7982 Long term (current) use of aspirin: Secondary | ICD-10-CM | POA: Insufficient documentation

## 2015-09-15 DIAGNOSIS — Y929 Unspecified place or not applicable: Secondary | ICD-10-CM | POA: Diagnosis not present

## 2015-09-15 DIAGNOSIS — F909 Attention-deficit hyperactivity disorder, unspecified type: Secondary | ICD-10-CM | POA: Insufficient documentation

## 2015-09-15 DIAGNOSIS — S93401A Sprain of unspecified ligament of right ankle, initial encounter: Secondary | ICD-10-CM | POA: Diagnosis not present

## 2015-09-15 DIAGNOSIS — Y9302 Activity, running: Secondary | ICD-10-CM | POA: Diagnosis not present

## 2015-09-15 DIAGNOSIS — S99911A Unspecified injury of right ankle, initial encounter: Secondary | ICD-10-CM | POA: Diagnosis present

## 2015-09-15 DIAGNOSIS — X58XXXA Exposure to other specified factors, initial encounter: Secondary | ICD-10-CM | POA: Insufficient documentation

## 2015-09-15 DIAGNOSIS — Y999 Unspecified external cause status: Secondary | ICD-10-CM | POA: Diagnosis not present

## 2015-09-15 MED ORDER — NAPROXEN 500 MG PO TABS: 500.0000 mg | ORAL_TABLET | Freq: Two times a day (BID) | ORAL | 0 refills | Status: DC

## 2015-09-15 NOTE — Discharge Instructions (Signed)
Take it easy, but do not lay around too much as this may make the stiffness worse. Take 500 mg of naproxen every 12 hours or 800 mg of ibuprofen every 8 hours for the next 3 days. Take these medications with food to avoid upset stomach.   Follow-up the attached instructions for further management of this injury. Follow up with your primary care provider for continued symptoms.

## 2015-09-15 NOTE — ED Notes (Signed)
Patient transported to X-ray 
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
## 2015-09-17 ENCOUNTER — Emergency Department (HOSPITAL_COMMUNITY)
Admission: EM | Admit: 2015-09-17 | Discharge: 2015-09-17 | Disposition: A | Discharge status: Home / Self Care | Payer: Medicaid Other | Attending: Emergency Medicine | Admitting: Emergency Medicine

## 2015-09-17 ENCOUNTER — Encounter (HOSPITAL_COMMUNITY): Payer: Self-pay | Admitting: *Deleted

## 2015-09-17 DIAGNOSIS — R1084 Generalized abdominal pain: Secondary | ICD-10-CM | POA: Diagnosis present

## 2015-09-17 DIAGNOSIS — Z7982 Long term (current) use of aspirin: Secondary | ICD-10-CM | POA: Insufficient documentation

## 2015-09-17 DIAGNOSIS — F909 Attention-deficit hyperactivity disorder, unspecified type: Secondary | ICD-10-CM | POA: Insufficient documentation

## 2015-09-17 DIAGNOSIS — R112 Nausea with vomiting, unspecified: Secondary | ICD-10-CM | POA: Diagnosis not present

## 2015-09-17 DIAGNOSIS — R197 Diarrhea, unspecified: Secondary | ICD-10-CM | POA: Diagnosis not present

## 2015-09-17 LAB — URINALYSIS, ROUTINE W REFLEX MICROSCOPIC
Bilirubin Urine: NEGATIVE
GLUCOSE, UA: NEGATIVE mg/dL
HGB URINE DIPSTICK: NEGATIVE
Ketones, ur: NEGATIVE mg/dL
LEUKOCYTES UA: NEGATIVE
NITRITE: NEGATIVE
PH: 7.5 (ref 5.0–8.0)
Protein, ur: NEGATIVE mg/dL
SPECIFIC GRAVITY, URINE: 1.028 (ref 1.005–1.030)

## 2015-09-17 LAB — COMPREHENSIVE METABOLIC PANEL
ALBUMIN: 3.9 g/dL (ref 3.5–5.0)
ALT: 41 U/L (ref 14–54)
ANION GAP: 10 (ref 5–15)
AST: 32 U/L (ref 15–41)
Alkaline Phosphatase: 50 U/L (ref 38–126)
BILIRUBIN TOTAL: 0.4 mg/dL (ref 0.3–1.2)
BUN: 8 mg/dL (ref 6–20)
CO2: 23 mmol/L (ref 22–32)
Calcium: 9.6 mg/dL (ref 8.9–10.3)
Chloride: 107 mmol/L (ref 101–111)
Creatinine, Ser: 0.57 mg/dL (ref 0.44–1.00)
GFR calc Af Amer: >60 mL/min (ref >60)
GFR calc non Af Amer: >60 mL/min (ref >60)
GLUCOSE: 83 mg/dL (ref 65–99)
POTASSIUM: 4.1 mmol/L (ref 3.5–5.1)
Sodium: 140 mmol/L (ref 135–145)
TOTAL PROTEIN: 7.2 g/dL (ref 6.5–8.1)

## 2015-09-17 LAB — PREGNANCY, URINE: PREG TEST UR: NEGATIVE

## 2015-09-17 LAB — CBC
HEMATOCRIT: 40.7 % (ref 36.0–46.0)
HEMOGLOBIN: 13.3 g/dL (ref 12.0–15.0)
MCH: 28.7 pg (ref 26.0–34.0)
MCHC: 32.7 g/dL (ref 30.0–36.0)
MCV: 87.7 fL (ref 78.0–100.0)
Platelets: 334 10*3/uL (ref 150–400)
RBC: 4.64 MIL/uL (ref 3.87–5.11)
RDW: 13.5 % (ref 11.5–15.5)
WBC: 9.4 10*3/uL (ref 4.0–10.5)

## 2015-09-17 LAB — LIPASE, BLOOD: Lipase: 30 U/L (ref 11–51)

## 2015-09-17 MED ORDER — DICYCLOMINE HCL 20 MG PO TABS: 20.0000 mg | ORAL_TABLET | Freq: Two times a day (BID) | ORAL | 0 refills | Status: DC

## 2015-09-17 MED ORDER — ONDANSETRON HCL 4 MG PO TABS: 4.0000 mg | ORAL_TABLET | Freq: Four times a day (QID) | ORAL | 0 refills | Status: DC | PRN

## 2015-09-17 NOTE — ED Provider Notes (Signed)
[facility] Provider Note   CSN: [id number] Arrival date & time: 09/17/15  1711   History   Chief Complaint Chief Complaint  Patient presents with  . Abdominal Pain  . Nausea  . Emesis  . Diarrhea    HPI Jordan Hanson is a 20 y.o. female.  The history is provided by the patient and medical records.   21 yo F with hx bipolar disorder, ADHD presenting with abdominal pain. Onset: several months ago. Waxing and waning since. Mild severity. None currently. Located in diffuse abdomen, somewhat centered around area just above umbilicus. Described as aching. No clear triggers for this - denies worsening with food, movement, or any changes with urination or bowel movements. No fevers, vaginal discharge or bleeding. Has nausea and decreased appetite, with NBNB emesis some days, but not all, and has lost weight unintentionally because of this. Has loose nonbloody stool sometimes as well, most days. No known FH of IBD - patient adopted. She has tried taking aleve for sx with minimal improvement.    Past Medical History:  Diagnosis Date  . ADHD (attention deficit hyperactivity disorder)   . Bipolar disorder (HCC)   . Depression     Patient Active Problem List   Diagnosis Date Noted  . Suicidal ideation 07/19/2014  . 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 MDD (major depressive disorder) (HCC) 06/17/2014    Past Surgical History:  Procedure Laterality Date  . TONSILLECTOMY      OB History    No data available       Home Medications    Prior to Admission medications   Medication Sig Start Date End Date Taking? Authorizing Provider  Aspirin-Acetaminophen-Caffeine (GOODY HEADACHE PO) Take 1 Package by mouth daily as needed (FOR MIGRAINES).    Historical Provider, MD  dicyclomine (BENTYL) 20 MG tablet Take 1 tablet (20 mg total) by mouth 2 (two) times daily. 09/17/15   Urban Gibson, MD  divalproex (DEPAKOTE ER) 500 MG 24 hr  tablet Take 2 tablets (1,000 mg total) by mouth at bedtime. 07/21/14   Shuvon B Rankin, NP  hydrOXYzine (ATARAX/VISTARIL) 25 MG tablet Take 1 tablet (25 mg total) by mouth 3 (three) times daily as needed for anxiety. Patient not taking: Reported on 10/06/2014 07/21/14   Shuvon B Rankin, NP  ibuprofen (ADVIL,MOTRIN) 800 MG tablet Take 1 tablet (800 mg total) by mouth 3 (three) times daily. 08/29/15   Elson Areas, PA-C  Norgestimate-Ethinyl Estradiol Triphasic 0.18/0.215/0.25 MG-35 MCG tablet Take 1 tablet by mouth daily.     Historical Provider, MD  ondansetron (ZOFRAN) 4 MG tablet Take 1 tablet (4 mg total) by mouth every 6 (six) hours as needed for nausea or vomiting. 09/17/15   Urban Gibson, MD  QUEtiapine (SEROQUEL) 300 MG tablet Take 1 tablet (300 mg total) by mouth at bedtime. 07/21/14   Shuvon B Rankin, NP  sertraline (ZOLOFT) 50 MG tablet Take 3 tablets (150 mg total) by mouth daily. 07/21/14   Talmage Nap, NP    Family History History reviewed. No pertinent family history.  Social History Social History  Substance Use Topics  . Smoking status: Never Smoker  . Smokeless tobacco: Never Used  . Alcohol use No     Comment: occ     Allergies   Penicillins   Review of Systems Review of Systems  Constitutional: Negative for chills and fever.  Respiratory: Negative for cough and shortness of breath.   Cardiovascular: Negative  for chest pain.  Gastrointestinal: Positive for abdominal pain, diarrhea, nausea and vomiting. Negative for blood in stool and constipation.  Genitourinary: Negative for dysuria, frequency, hematuria, vaginal bleeding and vaginal discharge.  Allergic/Immunologic: Negative for immunocompromised state.  Neurological: Negative for light-headedness.  All other systems reviewed and are negative.    Physical Exam Updated Vital Signs BP 119/81   Pulse 80   Temp 98.3 F (36.8 C) (Oral)   Resp 16   Ht 5\' 5"  (1.651 m)   Wt 94 kg   LMP 09/15/2015   SpO2  100%   BMI 34.48 kg/m   Physical Exam  Constitutional: She appears well-developed and well-nourished. No distress.  HENT:  Head: Normocephalic and atraumatic.  Eyes: Conjunctivae are normal.  Neck: Neck supple.  Cardiovascular: Normal rate and regular rhythm.   No murmur heard. Pulmonary/Chest: Effort normal and breath sounds normal. No respiratory distress.  Abdominal: Soft. Bowel sounds are normal. She exhibits no mass. There is no tenderness. There is no rebound and no guarding. No hernia.  Musculoskeletal: She exhibits no edema.  Neurological: She is alert.  Skin: Skin is warm and dry.  Psychiatric: She has a normal mood and affect.  Nursing note and vitals reviewed.    ED Treatments / Results  Labs (all labs ordered are listed, but only abnormal results are displayed) Labs Reviewed  LIPASE, BLOOD  COMPREHENSIVE METABOLIC PANEL  CBC  URINALYSIS, ROUTINE W REFLEX MICROSCOPIC (NOT [facility])  PREGNANCY, URINE    EKG  EKG Interpretation None       Radiology No results found.  Procedures Procedures (including critical care time)  Medications Ordered in ED Medications - No data to display   Initial Impression / Assessment and Plan / ED Course  I have reviewed the triage vital signs and the nursing notes.  Pertinent labs & imaging results that were available during my care of the patient were reviewed by me and considered in my medical decision making (see chart for details).  Clinical Course    20 yo F with hx bipolar disorder, ADHD presenting with reported history of waxing and waning abdominal pain, nausea, emesis, diarrhea, and decreased appetite with weight loss, as above. She is afebrile with stable vitals currently and no present symptoms. Her abdominal exam is completely benign. She is not pregnant and has no leukocytosis or anemia, normal electrolytes, glucose, renal function, LFTs and lipase. No UTI on UA. Do not feel imaging or any further acute tx  warranted. Discussed trial of using boost/protein shakes for calories, bentyl and zofran for sx, and avoiding NSAIDs. Will f/u with PCP and/or GI if sx persist. Return precautions discussed. Dc in stable condition.   Case discussed with Dr. Denton LankSteinl who oversaw management of this patient.   Final Clinical Impressions(s) / ED Diagnoses   Final diagnoses:  Nausea vomiting and diarrhea    New Prescriptions Discharge Medication List as of 09/17/2015  9:46 PM    START taking these medications   Details  dicyclomine (BENTYL) 20 MG tablet Take 1 tablet (20 mg total) by mouth 2 (two) times daily., Starting Sat 09/17/2015, Print         Urban GibsonJenny Kadesia Robel, MD 09/19/15 [id number]    Cathren LaineKevin Steinl, MD 09/19/15 [phone]

## 2015-09-17 NOTE — ED Notes (Signed)
EDP at bedside  

## 2015-09-17 NOTE — ED Notes (Signed)
Patient stated for the past several months she has been having nausea, vomiting, diarrhea, fatigue and weight loss.  Patient states she can eat 2 bites of a sandwich and within a short time has to go to the bathroom.  States when she vomits it is nothing but bile.  States she has lost about 30 pounds over the past couple of months.  States she has a lot going on in her life at this time.

## 2015-09-17 NOTE — ED Triage Notes (Signed)
Pt states for the past couple of months pt has been having abdominal pain, n/v/d, no appetite, and lost 45 lbs. Unknown if any blood in stool, pt states emesis is bile.

## 2015-10-05 ENCOUNTER — Other Ambulatory Visit: Payer: Self-pay | Admitting: Gastroenterology

## 2015-10-05 DIAGNOSIS — R1030 Lower abdominal pain, unspecified: Secondary | ICD-10-CM

## 2015-10-19 ENCOUNTER — Ambulatory Visit
Admission: RE | Admit: 2015-10-19 | Discharge: 2015-10-19 | Disposition: A | Discharge status: Home / Self Care | Payer: Medicaid Other | Source: Ambulatory Visit | Attending: Gastroenterology | Admitting: Gastroenterology

## 2015-10-19 ENCOUNTER — Other Ambulatory Visit: Payer: Self-pay | Admitting: Gastroenterology

## 2015-10-19 DIAGNOSIS — R1030 Lower abdominal pain, unspecified: Secondary | ICD-10-CM

## 2015-10-24 ENCOUNTER — Ambulatory Visit
Admission: RE | Admit: 2015-10-24 | Discharge: 2015-10-24 | Disposition: A | Discharge status: Home / Self Care | Payer: Medicaid Other | Source: Ambulatory Visit | Attending: Gastroenterology | Admitting: Gastroenterology

## 2015-10-24 DIAGNOSIS — R1030 Lower abdominal pain, unspecified: Secondary | ICD-10-CM

## 2015-10-26 ENCOUNTER — Other Ambulatory Visit: Payer: Self-pay

## 2015-12-05 ENCOUNTER — Emergency Department (HOSPITAL_COMMUNITY)
Admission: EM | Admit: 2015-12-05 | Discharge: 2015-12-05 | Disposition: A | Discharge status: Home / Self Care | Payer: Medicaid Other | Attending: Emergency Medicine | Admitting: Emergency Medicine

## 2015-12-05 ENCOUNTER — Emergency Department (HOSPITAL_COMMUNITY): Payer: Medicaid Other

## 2015-12-05 ENCOUNTER — Encounter (HOSPITAL_COMMUNITY): Payer: Self-pay

## 2015-12-05 DIAGNOSIS — Z7982 Long term (current) use of aspirin: Secondary | ICD-10-CM | POA: Insufficient documentation

## 2015-12-05 DIAGNOSIS — Y939 Activity, unspecified: Secondary | ICD-10-CM | POA: Insufficient documentation

## 2015-12-05 DIAGNOSIS — R1084 Generalized abdominal pain: Secondary | ICD-10-CM

## 2015-12-05 DIAGNOSIS — Y929 Unspecified place or not applicable: Secondary | ICD-10-CM | POA: Insufficient documentation

## 2015-12-05 DIAGNOSIS — W06XXXA Fall from bed, initial encounter: Secondary | ICD-10-CM | POA: Diagnosis not present

## 2015-12-05 DIAGNOSIS — Y999 Unspecified external cause status: Secondary | ICD-10-CM | POA: Insufficient documentation

## 2015-12-05 DIAGNOSIS — S5002XA Contusion of left elbow, initial encounter: Secondary | ICD-10-CM | POA: Diagnosis not present

## 2015-12-05 DIAGNOSIS — F909 Attention-deficit hyperactivity disorder, unspecified type: Secondary | ICD-10-CM | POA: Insufficient documentation

## 2015-12-05 DIAGNOSIS — S59902A Unspecified injury of left elbow, initial encounter: Secondary | ICD-10-CM | POA: Diagnosis present

## 2015-12-05 LAB — URINE MICROSCOPIC-ADD ON: RBC / HPF: NONE SEEN RBC/hpf (ref 0–5)

## 2015-12-05 LAB — URINALYSIS, ROUTINE W REFLEX MICROSCOPIC
Glucose, UA: NEGATIVE mg/dL
Hgb urine dipstick: NEGATIVE
Ketones, ur: NEGATIVE mg/dL
Nitrite: NEGATIVE
Protein, ur: NEGATIVE mg/dL
Specific Gravity, Urine: 1.027 (ref 1.005–1.030)
pH: 5.5 (ref 5.0–8.0)

## 2015-12-05 LAB — COMPREHENSIVE METABOLIC PANEL
ALT: 21 U/L (ref 14–54)
AST: 26 U/L (ref 15–41)
Albumin: 3.2 g/dL — ABNORMAL LOW (ref 3.5–5.0)
Alkaline Phosphatase: 52 U/L (ref 38–126)
Anion gap: 7 (ref 5–15)
BUN: 8 mg/dL (ref 6–20)
CO2: 24 mmol/L (ref 22–32)
Calcium: 9.3 mg/dL (ref 8.9–10.3)
Chloride: 108 mmol/L (ref 101–111)
Creatinine, Ser: 0.57 mg/dL (ref 0.44–1.00)
GFR calc Af Amer: >60 mL/min (ref >60)
GFR calc non Af Amer: >60 mL/min (ref >60)
Glucose, Bld: 95 mg/dL (ref 65–99)
Potassium: 4.1 mmol/L (ref 3.5–5.1)
Sodium: 139 mmol/L (ref 135–145)
Total Bilirubin: 0.3 mg/dL (ref 0.3–1.2)
Total Protein: 6.3 g/dL — ABNORMAL LOW (ref 6.5–8.1)

## 2015-12-05 LAB — CBC
HCT: 39.5 % (ref 36.0–46.0)
Hemoglobin: 12.8 g/dL (ref 12.0–15.0)
MCH: 28.3 pg (ref 26.0–34.0)
MCHC: 32.4 g/dL (ref 30.0–36.0)
MCV: 87.2 fL (ref 78.0–100.0)
Platelets: 309 10*3/uL (ref 150–400)
RBC: 4.53 MIL/uL (ref 3.87–5.11)
RDW: 12.6 % (ref 11.5–15.5)
WBC: 8.4 10*3/uL (ref 4.0–10.5)

## 2015-12-05 LAB — LIPASE, BLOOD: Lipase: 33 U/L (ref 11–51)

## 2015-12-05 MED ORDER — OXYCODONE-ACETAMINOPHEN 5-325 MG PO TABS
1.0000 | ORAL_TABLET | Freq: Once | ORAL | Status: AC
Start: 2015-12-05 — End: 2015-12-05
  Administered 2015-12-05: 1 via ORAL
  Filled 2015-12-05: qty 1

## 2015-12-05 MED ORDER — OXYCODONE-ACETAMINOPHEN 5-325 MG PO TABS
ORAL_TABLET | ORAL | Status: AC
  Filled 2015-12-05: qty 1

## 2015-12-05 MED ORDER — DICYCLOMINE HCL 10 MG PO CAPS
10.0000 mg | ORAL_CAPSULE | Freq: Once | ORAL | Status: AC
  Administered 2015-12-05: 10 mg via ORAL
  Filled 2015-12-05: qty 1

## 2015-12-05 MED ORDER — ONDANSETRON 4 MG PO TBDP
4.0000 mg | ORAL_TABLET | Freq: Once | ORAL | Status: AC
  Administered 2015-12-05: 4 mg via ORAL
  Filled 2015-12-05: qty 1

## 2015-12-05 MED ORDER — OXYCODONE-ACETAMINOPHEN 5-325 MG PO TABS
1.0000 | ORAL_TABLET | Freq: Once | ORAL | Status: AC
Start: 2015-12-05 — End: 2015-12-05
  Administered 2015-12-05: 1 via ORAL

## 2015-12-05 NOTE — ED Provider Notes (Signed)
[facility]-EMERGENCY DEPT Provider Note   CSN: [id number] Arrival date & time: 12/05/15  1449   By signing my name below, I, Clovis PuAvnee Patel, attest that this documentation has been prepared under the direction and in the presence of Raeford RazorStephen Rayhana Slider, MD  Electronically Signed: Clovis PuAvnee Patel, ED Scribe. 12/05/15. 5:49 PM.   History   Chief Complaint Chief Complaint  Patient presents with  . Fall  . Abdominal Pain   The history is provided by the patient. No language interpreter was used.   HPI Comments:  Jordan Hanson is a 20 y.o. female who presents to the Emergency Department complaining of sudden onset left arm pain s/p a fall which occurred in the PM yesterday. She states she fell out of her bed and landed on her left upper extremity. Pt also endorses subjective fevers and moderate abdominal pain with associated nausea, vomiting and diarrhea x 3 days. No alleviating factors noted. Pt denies difficulty urinating, sick contacts, any other associated symptoms and modifying factors at this time.    Past Medical History:  Diagnosis Date  . ADHD (attention deficit hyperactivity disorder)   . Bipolar disorder (HCC)   . Depression     Patient Active Problem List   Diagnosis Date Noted  . Suicidal ideation 07/19/2014  . 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 MDD (major depressive disorder) 06/17/2014    Past Surgical History:  Procedure Laterality Date  . TONSILLECTOMY      OB History    No data available       Home Medications    Prior to Admission medications   Medication Sig Start Date End Date Taking? Authorizing Provider  Aspirin-Acetaminophen-Caffeine (GOODY HEADACHE PO) Take 1 Package by mouth daily as needed (FOR MIGRAINES).    Historical Provider, MD  dicyclomine (BENTYL) 20 MG tablet Take 1 tablet (20 mg total) by mouth 2 (two) times daily. 09/17/15   Urban GibsonJenny Beatty, MD  divalproex (DEPAKOTE ER) 500 MG 24 hr tablet Take 2  tablets (1,000 mg total) by mouth at bedtime. 07/21/14   Shuvon B Rankin, NP  hydrOXYzine (ATARAX/VISTARIL) 25 MG tablet Take 1 tablet (25 mg total) by mouth 3 (three) times daily as needed for anxiety. Patient not taking: Reported on 10/06/2014 07/21/14   Shuvon B Rankin, NP  ibuprofen (ADVIL,MOTRIN) 800 MG tablet Take 1 tablet (800 mg total) by mouth 3 (three) times daily. 08/29/15   Elson AreasLeslie K Sofia, PA-C  Norgestimate-Ethinyl Estradiol Triphasic 0.18/0.215/0.25 MG-35 MCG tablet Take 1 tablet by mouth daily.     Historical Provider, MD  ondansetron (ZOFRAN) 4 MG tablet Take 1 tablet (4 mg total) by mouth every 6 (six) hours as needed for nausea or vomiting. 09/17/15   Urban GibsonJenny Beatty, MD  QUEtiapine (SEROQUEL) 300 MG tablet Take 1 tablet (300 mg total) by mouth at bedtime. 07/21/14   Shuvon B Rankin, NP  sertraline (ZOLOFT) 50 MG tablet Take 3 tablets (150 mg total) by mouth daily. 07/21/14   Talmage NapShuvon B Rankin, NP    Family History History reviewed. No pertinent family history.  Social History Social History  Substance Use Topics  . Smoking status: Never Smoker  . Smokeless tobacco: Never Used  . Alcohol use No     Comment: occ     Allergies   Penicillins   Review of Systems Review of Systems  Constitutional: Positive for fever (subjective).  Gastrointestinal: Positive for abdominal pain, diarrhea, nausea and vomiting.  Genitourinary: Negative for difficulty urinating.  Musculoskeletal: Positive for arthralgias.  All other systems reviewed and are negative.  Physical Exam Updated Vital Signs BP 106/68   Pulse 100   Temp 98.3 F (36.8 C) (Oral)   Resp 18   Ht 5\' 4"  (1.626 m)   LMP 11/21/2015 (Within Days)   SpO2 100%   Physical Exam  Constitutional: She is oriented to person, place, and time. She appears well-developed and well-nourished. No distress.  HENT:  Head: Normocephalic and atraumatic.  Eyes: Conjunctivae are normal.  Cardiovascular: Normal rate.   Pulmonary/Chest:  Effort normal.  Abdominal: She exhibits no distension. There is tenderness. There is no rebound and no guarding.  Mild periumbilical tenderness.   Musculoskeletal:  LUE: No point tenderness. Able to actively range without appearing discomfort.   Neurological: She is alert and oriented to person, place, and time.  Neurovascularly intact.  Skin: Skin is warm and dry.  Psychiatric: She has a normal mood and affect.  Nursing note and vitals reviewed.   ED Treatments / Results  DIAGNOSTIC STUDIES:  Oxygen Saturation is 100% on RA, normal by my interpretation.    COORDINATION OF CARE:  5:42 PM Discussed treatment plan with pt at bedside and pt agreed to plan.  Labs (all labs ordered are listed, but only abnormal results are displayed) Labs Reviewed  COMPREHENSIVE METABOLIC PANEL - Abnormal; Notable for the following:       Result Value   Total Protein 6.3 (*)    Albumin 3.2 (*)    All other components within normal limits  LIPASE, BLOOD  CBC  URINALYSIS, ROUTINE W REFLEX MICROSCOPIC (NOT [facility])    EKG  EKG Interpretation None       Radiology Dg Elbow Complete Left  Result Date: 12/05/2015 CLINICAL DATA:  Fall with left elbow pain.  Initial encounter. EXAM: LEFT ELBOW - COMPLETE 3+ VIEW COMPARISON:  05/14/2006 FINDINGS: There is no evidence of acute fracture, dislocation, or joint effusion. Chronic ossific structure projecting posteriorly to the radial head. No joint narrowing. IMPRESSION: No acute finding. Electronically Signed   By: Marnee SpringJonathon  Watts M.D.   On: 12/05/2015 15:54    Procedures Procedures (including critical care time)  Medications Ordered in ED Medications  oxyCODONE-acetaminophen (PERCOCET/ROXICET) 5-325 MG per tablet (not administered)  oxyCODONE-acetaminophen (PERCOCET/ROXICET) 5-325 MG per tablet 1 tablet (1 tablet Oral Given 12/05/15 1519)     Initial Impression / Assessment and Plan / ED Course  I have reviewed the triage vital signs and the  nursing notes.  Pertinent labs & imaging results that were available during my care of the patient were reviewed by me and considered in my medical decision making (see chart for details).  Clinical Course     Final Clinical Impressions(s) / ED Diagnoses   Final diagnoses:  Generalized abdominal pain  Contusion of left elbow, initial encounter    New Prescriptions New Prescriptions   No medications on file    I personally preformed the services scribed in my presence. The recorded information has been reviewed is accurate. Raeford RazorStephen Krystian Ferrentino, MD.     Raeford RazorStephen Knight Oelkers, MD 12/13/15 [phone]

## 2015-12-05 NOTE — ED Triage Notes (Signed)
Pt reports she fell out of her bed last night and fell on her elbow. She reports pain in her left arm. She also reports excruciating abd pain and being unable to tolerate PO intake.

## 2015-12-14 ENCOUNTER — Encounter (HOSPITAL_COMMUNITY): Payer: Self-pay | Admitting: *Deleted

## 2015-12-14 ENCOUNTER — Emergency Department (HOSPITAL_COMMUNITY)
Admission: EM | Admit: 2015-12-14 | Discharge: 2015-12-14 | Disposition: A | Discharge status: Home / Self Care | Payer: Medicaid Other | Attending: Emergency Medicine | Admitting: Emergency Medicine

## 2015-12-14 ENCOUNTER — Emergency Department (HOSPITAL_COMMUNITY): Payer: Medicaid Other

## 2015-12-14 DIAGNOSIS — Y929 Unspecified place or not applicable: Secondary | ICD-10-CM | POA: Insufficient documentation

## 2015-12-14 DIAGNOSIS — Z79899 Other long term (current) drug therapy: Secondary | ICD-10-CM | POA: Insufficient documentation

## 2015-12-14 DIAGNOSIS — S8001XA Contusion of right knee, initial encounter: Secondary | ICD-10-CM

## 2015-12-14 DIAGNOSIS — Y999 Unspecified external cause status: Secondary | ICD-10-CM | POA: Insufficient documentation

## 2015-12-14 DIAGNOSIS — W500XXA Accidental hit or strike by another person, initial encounter: Secondary | ICD-10-CM | POA: Diagnosis not present

## 2015-12-14 DIAGNOSIS — S83411A Sprain of medial collateral ligament of right knee, initial encounter: Secondary | ICD-10-CM | POA: Insufficient documentation

## 2015-12-14 DIAGNOSIS — F909 Attention-deficit hyperactivity disorder, unspecified type: Secondary | ICD-10-CM | POA: Diagnosis not present

## 2015-12-14 DIAGNOSIS — Z7982 Long term (current) use of aspirin: Secondary | ICD-10-CM | POA: Diagnosis not present

## 2015-12-14 DIAGNOSIS — Y9302 Activity, running: Secondary | ICD-10-CM | POA: Insufficient documentation

## 2015-12-14 MED ORDER — ONDANSETRON 4 MG PO TBDP
8.0000 mg | ORAL_TABLET | Freq: Once | ORAL | Status: AC
  Administered 2015-12-14: 8 mg via ORAL
  Filled 2015-12-14: qty 2

## 2015-12-14 MED ORDER — NAPROXEN 500 MG PO TABS: 500.0000 mg | ORAL_TABLET | Freq: Two times a day (BID) | ORAL | 0 refills | Status: DC

## 2015-12-14 MED ORDER — HYDROCODONE-ACETAMINOPHEN 5-325 MG PO TABS: 1.0000 | ORAL_TABLET | Freq: Four times a day (QID) | ORAL | 0 refills | Status: DC | PRN

## 2015-12-14 NOTE — ED Provider Notes (Signed)
[facility] Provider Note   CSN: [id number] Arrival date & time: 12/14/15  1756  By signing my name below, I, Emmanuella Mensah, attest that this documentation has been prepared under the direction and in the presence of Kerrie BuffaloHope Neese, NP. Electronically Signed: Angelene GiovanniEmmanuella Mensah, ED Scribe. 12/14/15. 7:49 PM.   History   Chief Complaint Chief Complaint  Patient presents with  . Knee Pain   HPI Comments: Jordan Hanson is a 20 y.o. female who presents to the Emergency Department complaining of gradually worsening moderate pain to the medial aspect of her right knee with bruising s/p knee injury that occurred at 6 pm yesterday. She reports associated difficulty with ROM and ambulating. She explains that she was playing with her younger brother when she accidentally struck her right knee on a bed frame post. She denies any falls, head injuries, or LOC. No alleviating factors noted. Pt has not tried any medications PTA. She denies any fever, chills, vomiting, numbness/tingling, weakness, open wounds, or any other symptoms.   The history is provided by the patient. No language interpreter was used.    Past Medical History:  Diagnosis Date  . ADHD (attention deficit hyperactivity disorder)   . Bipolar disorder (HCC)   . Depression     Patient Active Problem List   Diagnosis Date Noted  . Suicidal ideation 07/19/2014  . 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 MDD (major depressive disorder) 06/17/2014    Past Surgical History:  Procedure Laterality Date  . TONSILLECTOMY      OB History    No data available       Home Medications    Prior to Admission medications   Medication Sig Start Date End Date Taking? Authorizing Provider  Aspirin-Acetaminophen-Caffeine (GOODY HEADACHE PO) Take 1 Package by mouth daily as needed (FOR MIGRAINES).    Historical Provider, MD  dicyclomine (BENTYL) 20 MG tablet Take 1 tablet (20 mg  total) by mouth 2 (two) times daily. 09/17/15   Urban GibsonJenny Beatty, MD  divalproex (DEPAKOTE ER) 500 MG 24 hr tablet Take 2 tablets (1,000 mg total) by mouth at bedtime. 07/21/14   Shuvon B Rankin, NP  HYDROcodone-acetaminophen (NORCO) 5-325 MG tablet Take 1 tablet by mouth every 6 (six) hours as needed. 12/14/15   Hope Orlene OchM Neese, NP  hydrOXYzine (ATARAX/VISTARIL) 25 MG tablet Take 1 tablet (25 mg total) by mouth 3 (three) times daily as needed for anxiety. Patient not taking: Reported on 10/06/2014 07/21/14   Shuvon B Rankin, NP  ibuprofen (ADVIL,MOTRIN) 800 MG tablet Take 1 tablet (800 mg total) by mouth 3 (three) times daily. 08/29/15   Elson AreasLeslie K Sofia, PA-C  naproxen (NAPROSYN) 500 MG tablet Take 1 tablet (500 mg total) by mouth 2 (two) times daily. 12/14/15   Hope Orlene OchM Neese, NP  Norgestimate-Ethinyl Estradiol Triphasic 0.18/0.215/0.25 MG-35 MCG tablet Take 1 tablet by mouth daily.     Historical Provider, MD  ondansetron (ZOFRAN) 4 MG tablet Take 1 tablet (4 mg total) by mouth every 6 (six) hours as needed for nausea or vomiting. 09/17/15   Urban GibsonJenny Beatty, MD  QUEtiapine (SEROQUEL) 300 MG tablet Take 1 tablet (300 mg total) by mouth at bedtime. 07/21/14   Shuvon B Rankin, NP  sertraline (ZOLOFT) 50 MG tablet Take 3 tablets (150 mg total) by mouth daily. 07/21/14   Talmage NapShuvon B Rankin, NP    Family History History reviewed. No pertinent family history.  Social History Social History  Substance Use Topics  .  Smoking status: Never Smoker  . Smokeless tobacco: Never Used  . Alcohol use No     Comment: occ     Allergies   Penicillins   Review of Systems Review of Systems  Constitutional: Negative for chills and fever.  Gastrointestinal: Negative for vomiting.  Musculoskeletal: Positive for arthralgias.       Right knee pain  Skin: Negative for wound. Color change: bruising.  Neurological: Negative for weakness and numbness.     Physical Exam Updated Vital Signs BP 111/62 (BP Location: Right Arm)    Pulse 84   Temp 98.4 F (36.9 C) (Oral)   Resp 16   LMP 11/21/2015 (Within Days)   SpO2 98%   Physical Exam  Constitutional: She is oriented to person, place, and time. She appears well-developed and well-nourished. No distress.  HENT:  Head: Normocephalic and atraumatic.  Eyes: Conjunctivae and EOM are normal.  Neck: Neck supple. No tracheal deviation present.  Cardiovascular: Normal rate.   Pulmonary/Chest: Effort normal. No respiratory distress.  Musculoskeletal:       Right knee: She exhibits no laceration, no erythema and normal alignment. Decreased range of motion: due to pain. Swelling: minimal. Tenderness found. MCL tenderness noted.  Tenderness over the MCL on the right with minimal swelling; tenderness over the MCL with ROM  Pedal pulse 2+, adequate circulation.   Neurological: She is alert and oriented to person, place, and time.  Skin: Skin is warm and dry.  Psychiatric: She has a normal mood and affect. Her behavior is normal.  Nursing note and vitals reviewed.    ED Treatments / Results  DIAGNOSTIC STUDIES: Oxygen Saturation is 97% on RA, adequate by my interpretation.    COORDINATION OF CARE: 7:45 PM- Pt advised of plan for treatment and pt agrees. Pt informed of her knee x-ray results. She will receive knee brace and crutches. Will provide resources for Ortho follow up.    Labs (all labs ordered are listed, but only abnormal results are displayed) Labs Reviewed - No data to display  Radiology Dg Knee Complete 4 Views Right  Result Date: 12/14/2015 CLINICAL DATA:  Initial evaluation for acute trauma, now with acute medial right knee pain. EXAM: RIGHT KNEE - COMPLETE 4+ VIEW COMPARISON:  None. FINDINGS: No evidence of fracture, dislocation, or joint effusion. No evidence of arthropathy or other focal bone abnormality. Soft tissues are unremarkable. IMPRESSION: No acute osseous abnormality identified about the right knee. Electronically Signed   By: Rise Mu M.D.   On: 12/14/2015 18:31    Procedures Procedures (including critical care time)  Medications Ordered in ED Medications  ondansetron (ZOFRAN-ODT) disintegrating tablet 8 mg (8 mg Oral Given 12/14/15 2028)     Initial Impression / Assessment and Plan / ED Course  Kerrie Buffalo, NP has reviewed the triage vital signs and the nursing notes.  Pertinent imaging results that were available during my care of the patient were reviewed by me and considered in my medical decision making (see chart for details).  Clinical Course    Patient with right knee pain. Presentation concerning for sprain. X-rays reviewed by me revealed no bony abnormality, dislocation, or fracture. Patient is neurovascularly intact distally and able to move right knee although states it is painful. Patient given knee brace and crutches in the ED. Discussed RICE and pain medication. Instructed patient to follow up with Orthopedics within 2-3 days to have right knee reevaluated. Discussed strict return precautions to the ED. patient appears reliable and expressed  understanding to the discharge instructions.    Final Clinical Impressions(s) / ED Diagnoses   Final diagnoses:  Contusion of right knee, initial encounter  Sprain of medial collateral ligament of right knee, initial encounter    New Prescriptions Discharge Medication List as of 12/14/2015  7:46 PM     I personally performed the services described in this documentation, which was scribed in my presence. The recorded information has been reviewed and is accurate.    RichvilleHope M Neese, NP 12/15/15 0112    Nira ConnPedro Eduardo Cardama, MD 12/15/15 [phone]

## 2015-12-14 NOTE — Progress Notes (Signed)
Orthopedic Tech Progress Note Patient Details:  Jordan HalterKatia B Hanson 14-Feb-1995 [id number]  Ortho Devices Type of Ortho Device: Knee Immobilizer, Crutches Ortho Device/Splint Location: RLE Ortho Device/Splint Interventions: Ordered, Application   Jennye MoccasinHughes, Valissa Lyvers Craig 12/14/2015, 8:27 PM

## 2015-12-14 NOTE — ED Triage Notes (Signed)
Pt reports running into a bed post yesterday and now has right knee pain. Ambulatory at triage.

## 2016-01-04 ENCOUNTER — Encounter (HOSPITAL_COMMUNITY): Payer: Self-pay

## 2016-01-04 DIAGNOSIS — R109 Unspecified abdominal pain: Secondary | ICD-10-CM | POA: Insufficient documentation

## 2016-01-04 DIAGNOSIS — F909 Attention-deficit hyperactivity disorder, unspecified type: Secondary | ICD-10-CM | POA: Insufficient documentation

## 2016-01-04 DIAGNOSIS — Z5321 Procedure and treatment not carried out due to patient leaving prior to being seen by health care provider: Secondary | ICD-10-CM | POA: Insufficient documentation

## 2016-01-04 DIAGNOSIS — Z7982 Long term (current) use of aspirin: Secondary | ICD-10-CM | POA: Diagnosis not present

## 2016-01-04 LAB — URINALYSIS, ROUTINE W REFLEX MICROSCOPIC
BILIRUBIN URINE: NEGATIVE
Bacteria, UA: NONE SEEN
GLUCOSE, UA: NEGATIVE mg/dL
KETONES UR: NEGATIVE mg/dL
NITRITE: NEGATIVE
PH: 7 (ref 5.0–8.0)
Protein, ur: NEGATIVE mg/dL
Specific Gravity, Urine: 1.023 (ref 1.005–1.030)

## 2016-01-04 NOTE — ED Triage Notes (Signed)
Pt c/o of R flank pain for two days, denies urinary symptoms, states she has vomited x 3 today, denies hx of stones.

## 2016-01-05 ENCOUNTER — Emergency Department (HOSPITAL_COMMUNITY)
Admission: EM | Admit: 2016-01-05 | Discharge: 2016-01-05 | Disposition: A | Discharge status: Home / Self Care | Payer: Medicaid Other | Attending: Emergency Medicine | Admitting: Emergency Medicine

## 2016-01-05 NOTE — ED Notes (Signed)
Called pt name three times in lobby. No response. 

## 2016-02-09 ENCOUNTER — Encounter (HOSPITAL_COMMUNITY): Payer: Self-pay

## 2016-02-09 ENCOUNTER — Emergency Department (HOSPITAL_COMMUNITY)
Admission: EM | Admit: 2016-02-09 | Discharge: 2016-02-10 | Disposition: A | Discharge status: Other Acute Inpatient Hospital | Payer: Medicaid Other | Attending: Emergency Medicine | Admitting: Emergency Medicine

## 2016-02-09 DIAGNOSIS — R45851 Suicidal ideations: Secondary | ICD-10-CM

## 2016-02-09 DIAGNOSIS — F32A Depression, unspecified: Secondary | ICD-10-CM

## 2016-02-09 DIAGNOSIS — N9489 Other specified conditions associated with female genital organs and menstrual cycle: Secondary | ICD-10-CM | POA: Insufficient documentation

## 2016-02-09 DIAGNOSIS — F909 Attention-deficit hyperactivity disorder, unspecified type: Secondary | ICD-10-CM | POA: Insufficient documentation

## 2016-02-09 DIAGNOSIS — F17210 Nicotine dependence, cigarettes, uncomplicated: Secondary | ICD-10-CM | POA: Insufficient documentation

## 2016-02-09 DIAGNOSIS — F329 Major depressive disorder, single episode, unspecified: Secondary | ICD-10-CM | POA: Insufficient documentation

## 2016-02-09 DIAGNOSIS — Z79899 Other long term (current) drug therapy: Secondary | ICD-10-CM | POA: Insufficient documentation

## 2016-02-09 DIAGNOSIS — Z7982 Long term (current) use of aspirin: Secondary | ICD-10-CM | POA: Insufficient documentation

## 2016-02-09 LAB — RAPID URINE DRUG SCREEN, HOSP PERFORMED
Amphetamines: NOT DETECTED
BARBITURATES: NOT DETECTED
Benzodiazepines: NOT DETECTED
Cocaine: NOT DETECTED
Opiates: NOT DETECTED
TETRAHYDROCANNABINOL: POSITIVE — AB

## 2016-02-09 LAB — CBC
HEMATOCRIT: 39.9 % (ref 36.0–46.0)
HEMOGLOBIN: 13.3 g/dL (ref 12.0–15.0)
MCH: 28.7 pg (ref 26.0–34.0)
MCHC: 33.3 g/dL (ref 30.0–36.0)
MCV: 86.2 fL (ref 78.0–100.0)
Platelets: 350 10*3/uL (ref 150–400)
RBC: 4.63 MIL/uL (ref 3.87–5.11)
RDW: 12.7 % (ref 11.5–15.5)
WBC: 10.5 10*3/uL (ref 4.0–10.5)

## 2016-02-09 LAB — HCG, QUANTITATIVE, PREGNANCY: hCG, Beta Chain, Quant, S: 1 m[IU]/mL (ref <5)

## 2016-02-09 LAB — COMPREHENSIVE METABOLIC PANEL
ALBUMIN: 3.4 g/dL — AB (ref 3.5–5.0)
ALT: 18 U/L (ref 14–54)
AST: 19 U/L (ref 15–41)
Alkaline Phosphatase: 50 U/L (ref 38–126)
Anion gap: 9 (ref 5–15)
BILIRUBIN TOTAL: 0.4 mg/dL (ref 0.3–1.2)
BUN: 11 mg/dL (ref 6–20)
CALCIUM: 9.4 mg/dL (ref 8.9–10.3)
CO2: 22 mmol/L (ref 22–32)
Chloride: 106 mmol/L (ref 101–111)
Creatinine, Ser: 0.6 mg/dL (ref 0.44–1.00)
GFR calc Af Amer: >60 mL/min (ref >60)
GFR calc non Af Amer: >60 mL/min (ref >60)
GLUCOSE: 98 mg/dL (ref 65–99)
Potassium: 4 mmol/L (ref 3.5–5.1)
Sodium: 137 mmol/L (ref 135–145)
TOTAL PROTEIN: 6.9 g/dL (ref 6.5–8.1)

## 2016-02-09 LAB — CBG MONITORING, ED: GLUCOSE-CAPILLARY: 106 mg/dL — AB (ref 65–99)

## 2016-02-09 LAB — ACETAMINOPHEN LEVEL: Acetaminophen (Tylenol), Serum: <10 ug/mL — ABNORMAL LOW (ref 10–30)

## 2016-02-09 LAB — ETHANOL: Alcohol, Ethyl (B): <5 mg/dL (ref <5)

## 2016-02-09 LAB — SALICYLATE LEVEL: Salicylate Lvl: <7 mg/dL (ref 2.8–30.0)

## 2016-02-09 MED ORDER — HYDROXYZINE HCL 25 MG PO TABS: 25.0000 mg | ORAL_TABLET | Freq: Three times a day (TID) | ORAL | Status: DC | PRN

## 2016-02-09 MED ORDER — DIVALPROEX SODIUM ER 500 MG PO TB24
1000.0000 mg | ORAL_TABLET | Freq: Every day | ORAL | Status: DC
  Filled 2016-02-09: qty 2

## 2016-02-09 MED ORDER — DICYCLOMINE HCL 20 MG PO TABS
20.0000 mg | ORAL_TABLET | Freq: Two times a day (BID) | ORAL | Status: DC
  Administered 2016-02-09 – 2016-02-10 (×2): 20 mg via ORAL
  Filled 2016-02-09 (×2): qty 1

## 2016-02-09 MED ORDER — NORGESTIM-ETH ESTRAD TRIPHASIC 0.18/0.215/0.25 MG-35 MCG PO TABS: 1.0000 | ORAL_TABLET | Freq: Every day | ORAL | Status: DC

## 2016-02-09 MED ORDER — QUETIAPINE FUMARATE 25 MG PO TABS
300.0000 mg | ORAL_TABLET | Freq: Every day | ORAL | Status: DC
  Filled 2016-02-09: qty 1

## 2016-02-09 MED ORDER — SERTRALINE HCL 50 MG PO TABS
150.0000 mg | ORAL_TABLET | Freq: Every day | ORAL | Status: DC
  Administered 2016-02-09 – 2016-02-10 (×2): 150 mg via ORAL
  Filled 2016-02-09 (×2): qty 3

## 2016-02-09 NOTE — ED Notes (Signed)
TTS assessment complete. 

## 2016-02-09 NOTE — ED Notes (Signed)
Inventoried pt belongings. Two bags & a brown backpack locked in lower cabinet of pt's room, one envelope with security, and three medications held with Main Pharmacy. Signatures obtained. Reviewed "What to expect" form and rules for psych hold.
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
## 2016-02-09 NOTE — ED Provider Notes (Signed)
[facility]PT Provider Note   CSN: [id number] Arrival date & time: 02/09/16  1940     History   Chief Complaint Chief Complaint  Patient presents with  . Suicidal    HPI Jordan Hanson is a 21 y.o. female.  2 days ago patient tried to cut her arm because she's been suicidal since the death of her fianc.   The history is provided by the patient.  Mental Health Problem  Presenting symptoms: depression, suicidal thoughts and suicide attempt   Degree of incapacity (severity):  Severe Onset quality:  Gradual Duration:  2 weeks Timing:  Constant Progression:  Worsening Chronicity:  Recurrent Context: stressful life event   Context comment:  Fiance died 2 weeks ago Treatment compliance:  All of the time Relieved by:  Antidepressants and mood stabilizers Worsened by:  Nothing Associated symptoms: anhedonia and feelings of worthlessness   Risk factors: hx of mental illness and hx of suicide attempts     Past Medical History:  Diagnosis Date  . ADHD (attention deficit hyperactivity disorder)   . Bipolar disorder (HCC)   . Depression     Patient Active Problem List   Diagnosis Date Noted  . Suicidal ideation 07/19/2014  . 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 MDD (major depressive disorder) 06/17/2014    Past Surgical History:  Procedure Laterality Date  . TONSILLECTOMY      OB History    No data available       Home Medications    Prior to Admission medications   Medication Sig Start Date End Date Taking? Authorizing Provider  Aspirin-Acetaminophen-Caffeine (GOODY HEADACHE PO) Take 1 Package by mouth daily as needed (FOR MIGRAINES).    Historical Provider, MD  dicyclomine (BENTYL) 20 MG tablet Take 1 tablet (20 mg total) by mouth 2 (two) times daily. 09/17/15   Urban Gibson, MD  divalproex (DEPAKOTE ER) 500 MG 24 hr tablet Take 2 tablets (1,000 mg total) by mouth at bedtime. 07/21/14   Shuvon B Rankin, NP   HYDROcodone-acetaminophen (NORCO) 5-325 MG tablet Take 1 tablet by mouth every 6 (six) hours as needed. 12/14/15   Hope Orlene Och, NP  hydrOXYzine (ATARAX/VISTARIL) 25 MG tablet Take 1 tablet (25 mg total) by mouth 3 (three) times daily as needed for anxiety. Patient not taking: Reported on 10/06/2014 07/21/14   Shuvon B Rankin, NP  ibuprofen (ADVIL,MOTRIN) 800 MG tablet Take 1 tablet (800 mg total) by mouth 3 (three) times daily. 08/29/15   Elson Areas, PA-C  naproxen (NAPROSYN) 500 MG tablet Take 1 tablet (500 mg total) by mouth 2 (two) times daily. 12/14/15   Hope Orlene Och, NP  Norgestimate-Ethinyl Estradiol Triphasic 0.18/0.215/0.25 MG-35 MCG tablet Take 1 tablet by mouth daily.     Historical Provider, MD  ondansetron (ZOFRAN) 4 MG tablet Take 1 tablet (4 mg total) by mouth every 6 (six) hours as needed for nausea or vomiting. 09/17/15   Urban Gibson, MD  QUEtiapine (SEROQUEL) 300 MG tablet Take 1 tablet (300 mg total) by mouth at bedtime. 07/21/14   Shuvon B Rankin, NP  sertraline (ZOLOFT) 50 MG tablet Take 3 tablets (150 mg total) by mouth daily. 07/21/14   Talmage Nap, NP    Family History History reviewed. No pertinent family history.  Social History Social History  Substance Use Topics  . Smoking status: Never Smoker  . Smokeless tobacco: Never Used  . Alcohol use No     Comment: occ  Allergies   Penicillins   Review of Systems Review of Systems  Psychiatric/Behavioral: Positive for suicidal ideas.  All other systems reviewed and are negative.    Physical Exam Updated Vital Signs BP 113/64 (BP Location: Right Arm)   Pulse 84   Temp 98.4 F (36.9 C) (Oral)   Resp 22   Ht 5\' 4"  (1.626 m)   Wt 227 lb 2 oz (103 kg)   LMP 01/19/2016 (Exact Date)   SpO2 99%   BMI 38.99 kg/m   Physical Exam  Constitutional: She is oriented to person, place, and time. She appears well-developed and well-nourished. No distress.  HENT:  Head: Normocephalic and atraumatic.    Mouth/Throat: Oropharynx is clear and moist.  Eyes: Conjunctivae and EOM are normal. Pupils are equal, round, and reactive to light.  Neck: Normal range of motion. Neck supple.  Cardiovascular: Normal rate, regular rhythm and intact distal pulses.   No murmur heard. Pulmonary/Chest: Effort normal and breath sounds normal. No respiratory distress. She has no wheezes. She has no rales.  Abdominal: Soft. She exhibits no distension. There is no tenderness. There is no rebound and no guarding.  Musculoskeletal: Normal range of motion. She exhibits no edema or tenderness.  Neurological: She is alert and oriented to person, place, and time.  Skin: Skin is warm and dry. No rash noted. No erythema.  Psychiatric: Her speech is normal and behavior is normal. Her affect is blunt. She is not actively hallucinating. She exhibits a depressed mood. She expresses suicidal ideation. She expresses no suicidal plans.  Nursing note and vitals reviewed.    ED Treatments / Results  Labs (all labs ordered are listed, but only abnormal results are displayed) Labs Reviewed  COMPREHENSIVE METABOLIC PANEL - Abnormal; Notable for the following:       Result Value   Albumin 3.4 (*)    All other components within normal limits  ACETAMINOPHEN LEVEL - Abnormal; Notable for the following:    Acetaminophen (Tylenol), Serum <10 (*)    All other components within normal limits  RAPID URINE DRUG SCREEN, HOSP PERFORMED - Abnormal; Notable for the following:    Tetrahydrocannabinol POSITIVE (*)    All other components within normal limits  ETHANOL  SALICYLATE LEVEL  CBC  HCG, QUANTITATIVE, PREGNANCY  CBG MONITORING, ED    EKG  EKG Interpretation None       Radiology No results found.  Procedures Procedures (including critical care time)  Medications Ordered in ED Medications  dicyclomine (BENTYL) tablet 20 mg (not administered)  divalproex (DEPAKOTE ER) 24 hr tablet 1,000 mg (not administered)   hydrOXYzine (ATARAX/VISTARIL) tablet 25 mg (not administered)  Norgestimate-Ethinyl Estradiol Triphasic 0.18/0.215/0.25 MG-35 MCG tablet 1 tablet (not administered)  QUEtiapine (SEROQUEL) tablet 300 mg (not administered)  sertraline (ZOLOFT) tablet 150 mg (not administered)     Initial Impression / Assessment and Plan / ED Course  I have reviewed the triage vital signs and the nursing notes.  Pertinent labs & imaging results that were available during my care of the patient were reviewed by me and considered in my medical decision making (see chart for details).    Patient presenting with worsening depression and intermittent suicidal thoughts with the attempt to cut her wrists 2 days ago. This all started when patient's fianc died. She has not reached out to her there persist or her psychiatrist. She states she's just been shutting them out.  Labs wnl. Medically cleared and TTS to evaluate  Final Clinical Impressions(s) /  ED Diagnoses   Final diagnoses:  Suicidal ideation  Depression, unspecified depression type    New Prescriptions New Prescriptions   No medications on file     Gwyneth Sprout, MD 02/09/16 2201

## 2016-02-09 NOTE — ED Triage Notes (Signed)
Pt here for suicidal thoughts and depression. Pt states she cut her left arm 2 days ago, cuts are visible and superficial. Pt states that she also tried to overdose on sleeping pills 5 days ago. Pt states that she has no plan now for hurting herself. A&Ox4. NAD.

## 2016-02-10 ENCOUNTER — Encounter (HOSPITAL_COMMUNITY): Payer: Self-pay

## 2016-02-10 ENCOUNTER — Inpatient Hospital Stay (HOSPITAL_COMMUNITY)
Admission: AD | Admit: 2016-02-10 | Discharge: 2016-02-13 | DRG: 885 | Disposition: A | Discharge status: Home / Self Care | Payer: Medicaid Other | Source: Intra-hospital | Attending: Psychiatry | Admitting: Psychiatry

## 2016-02-10 DIAGNOSIS — Z79899 Other long term (current) drug therapy: Secondary | ICD-10-CM

## 2016-02-10 DIAGNOSIS — Z9889 Other specified postprocedural states: Secondary | ICD-10-CM

## 2016-02-10 DIAGNOSIS — F333 Major depressive disorder, recurrent, severe with psychotic symptoms: Secondary | ICD-10-CM | POA: Diagnosis not present

## 2016-02-10 DIAGNOSIS — Z880 Allergy status to penicillin: Secondary | ICD-10-CM | POA: Diagnosis not present

## 2016-02-10 DIAGNOSIS — F17210 Nicotine dependence, cigarettes, uncomplicated: Secondary | ICD-10-CM | POA: Diagnosis present

## 2016-02-10 DIAGNOSIS — Z813 Family history of other psychoactive substance abuse and dependence: Secondary | ICD-10-CM

## 2016-02-10 DIAGNOSIS — F319 Bipolar disorder, unspecified: Principal | ICD-10-CM | POA: Diagnosis present

## 2016-02-10 DIAGNOSIS — F332 Major depressive disorder, recurrent severe without psychotic features: Secondary | ICD-10-CM | POA: Diagnosis present

## 2016-02-10 DIAGNOSIS — F603 Borderline personality disorder: Secondary | ICD-10-CM

## 2016-02-10 DIAGNOSIS — Z915 Personal history of self-harm: Secondary | ICD-10-CM

## 2016-02-10 DIAGNOSIS — F1290 Cannabis use, unspecified, uncomplicated: Secondary | ICD-10-CM | POA: Diagnosis not present

## 2016-02-10 MED ORDER — TRAZODONE HCL 50 MG PO TABS
50.0000 mg | ORAL_TABLET | Freq: Every evening | ORAL | Status: DC | PRN
  Administered 2016-02-12: 50 mg via ORAL
  Filled 2016-02-10: qty 1

## 2016-02-10 MED ORDER — ACETAMINOPHEN 325 MG PO TABS
650.0000 mg | ORAL_TABLET | Freq: Four times a day (QID) | ORAL | Status: DC | PRN
  Administered 2016-02-10 – 2016-02-11 (×2): 650 mg via ORAL
  Filled 2016-02-10 (×2): qty 2

## 2016-02-10 MED ORDER — NICOTINE 21 MG/24HR TD PT24
MEDICATED_PATCH | TRANSDERMAL | Status: AC
  Administered 2016-02-10: 21 mg via TRANSDERMAL
  Filled 2016-02-10: qty 1

## 2016-02-10 MED ORDER — DIVALPROEX SODIUM ER 500 MG PO TB24
1000.0000 mg | ORAL_TABLET | Freq: Every day | ORAL | Status: DC
  Administered 2016-02-10 – 2016-02-12 (×3): 1000 mg via ORAL
  Filled 2016-02-10 (×5): qty 2

## 2016-02-10 MED ORDER — SERTRALINE HCL 50 MG PO TABS
150.0000 mg | ORAL_TABLET | Freq: Every day | ORAL | Status: DC
  Administered 2016-02-11 – 2016-02-13 (×3): 150 mg via ORAL
  Filled 2016-02-10 (×5): qty 1

## 2016-02-10 MED ORDER — NICOTINE 21 MG/24HR TD PT24
21.0000 mg | MEDICATED_PATCH | Freq: Every day | TRANSDERMAL | Status: DC
  Administered 2016-02-10 – 2016-02-11 (×2): 21 mg via TRANSDERMAL
  Filled 2016-02-10 (×4): qty 1

## 2016-02-10 MED ORDER — HYDROXYZINE HCL 25 MG PO TABS: 25.0000 mg | ORAL_TABLET | Freq: Three times a day (TID) | ORAL | Status: DC | PRN

## 2016-02-10 MED ORDER — HYDROXYZINE HCL 25 MG PO TABS
25.0000 mg | ORAL_TABLET | Freq: Four times a day (QID) | ORAL | Status: DC | PRN
  Administered 2016-02-11 – 2016-02-12 (×2): 25 mg via ORAL
  Filled 2016-02-10 (×2): qty 1

## 2016-02-10 MED ORDER — ALUM & MAG HYDROXIDE-SIMETH 200-200-20 MG/5ML PO SUSP: 30.0000 mL | ORAL | Status: DC | PRN

## 2016-02-10 MED ORDER — DICYCLOMINE HCL 20 MG PO TABS
20.0000 mg | ORAL_TABLET | Freq: Two times a day (BID) | ORAL | Status: DC
  Administered 2016-02-10 – 2016-02-13 (×6): 20 mg via ORAL
  Filled 2016-02-10 (×10): qty 1

## 2016-02-10 MED ORDER — MAGNESIUM HYDROXIDE 400 MG/5ML PO SUSP: 30.0000 mL | Freq: Every day | ORAL | Status: DC | PRN

## 2016-02-10 MED ORDER — QUETIAPINE FUMARATE 300 MG PO TABS
300.0000 mg | ORAL_TABLET | Freq: Every day | ORAL | Status: DC
  Administered 2016-02-10 – 2016-02-12 (×3): 300 mg via ORAL
  Filled 2016-02-10 (×5): qty 1

## 2016-02-10 NOTE — Progress Notes (Signed)
While receiving medications, patient saw writer's wedding rings and stated "i'm getting married on May 20th. The wedding is going to be in [place], my fiance is [place]". Patient states "I didn't date a lot in high school, I was too focused on my studies but when I met him everything changed". Patient states her fiance is actively serving in [place] at an "undisclosed location". Patient observed in dayroom talking to fiance about peers in similar manner. 

## 2016-02-10 NOTE — ED Notes (Signed)
Pt is in stable condition upon d/c and [facility]

## 2016-02-10 NOTE — Progress Notes (Signed)
Jordan Hanson is a 21 year old female being admitted voluntarily to [facility] from [facility].  She came in with suicidal ideation.  She has history of multiple suicide attempts in the past.  She has a history of cutting and has been doing this since age 21. Her fianc was in active [employer] and Compan[employer]ilitary and was killed two weeks ago.  She also reports that she has been having difficulty finding a job.  She denied HI or A/V hallucinations.  She reported feeling sad, tired, guilt, poor concentration, hopelessness and helplessness.  She has no history of medical issues and appears to be in no physical distress.  [facility] he reported that she is feeling much better today, "I don't have any complaints."  She denies SI/H or A/V hallucinations.  Oriented her to the unit.  Admission paperwork completed and signed.  Belongings searched and secured in locker # 47.  Skin assessment completed and noted old well healed self inflicted scars on both forearms.  Q 15 minute checks initiated for safety.  We will monitor the progress towards her goals.

## 2016-02-10 NOTE — ED Notes (Signed)
Patient is drawing and coloring pictures at this time.

## 2016-02-10 NOTE — Progress Notes (Signed)
Nursing Progress Note 7p-7a  D) Patient presents pleasant and cooperative with Clinical research associatewriter. Patient requesting to be placed on the 400 hall stating "I am not a drug addict, I don't need to go to AA or any of these meetings. If I'm going to be here I may as well go to groups that apply to me". Patient requesting to sign 72 hour discharge request form stating "I want to leave as soon as possible". Patient denies SI/HI/AVH or pain. Patient contracts for safety at this time. Patient is seen interacting with peers in the dayroom and was provided snacks and fluids.  A) Emotional support given. Patient medicated with PM orders as prescribed. Medications reviewed with patient. Patient on q15 min safety checks. Opportunities for questions or concerns presented to patient. Patient encouraged to continue to work on treatment goals. Patient provided information about 72 hour discharge requests. Patient verbalizes understanding. Patient signed the form at [id number].   R) Patient receptive to interaction with nurse. Patient remains safe on the unit at this time. Patient is resting in bed without complaints. Will continue to monitor.

## 2016-02-10 NOTE — ED Notes (Signed)
Pt agrees to voluntary consent to signing [facility]. Consent form signed and faxed.

## 2016-02-10 NOTE — ED Notes (Signed)
Pt went to take a shower. Sitter with patient.

## 2016-02-10 NOTE — ED Notes (Signed)
Lunch has been ordered  

## 2016-02-10 NOTE — Plan of Care (Signed)
Problem: Education: Goal: Knowledge of [facility] Education information/materials will improve Outcome: Progressing Patient admitted and oriented to the unit and provided educational materials about facility. Patient without questions or concerns.  Problem: Safety: Goal: Periods of time without injury will increase Outcome: Progressing Patient remains safe on the unit. Patient contracts for safety and denies SI.

## 2016-02-10 NOTE — ED Notes (Signed)
Regular breakfast tray ordered.  

## 2016-02-10 NOTE — ED Notes (Signed)
Patient talking on phone at this time. 

## 2016-02-10 NOTE — Tx Team (Signed)
Initial Treatment Plan 02/10/2016 5:13 PM Jordan Hanson [id number]    PATIENT STRESSORS: Loss of fiance (was killed 2 weeks ago while in the armed forces-[employer]) Occupational concerns Traumatic event   PATIENT STRENGTHS: Wellsite geologistCommunication skills General fund of knowledge Physical Health   PATIENT IDENTIFIED PROBLEMS: Depression  Anxiety   Suicidal ideation  "I want to be better with my depression and mood swings"  "I want to work on my Manufacturing systems engineercommunication skills when I'm depressed"             DISCHARGE CRITERIA:  Improved stabilization in mood, thinking, and/or behavior Verbal commitment to aftercare and medication compliance  PRELIMINARY DISCHARGE PLAN: Outpatient therapy Medication management  PATIENT/FAMILY INVOLVEMENT: This treatment plan has been presented to and reviewed with the patient, Jordan Hanson.  The patient and family have been given the opportunity to ask questions and make suggestions.  Levin BaconHeather V Aigner Horseman, RN 02/10/2016, 5:13 PM

## 2016-02-10 NOTE — Progress Notes (Signed)
Patient requesting to sign 72 hour discharge form. Patient educated about policy. Patient verbalized understanding and verbalized she wanted to proceed. Form signed at 2040.

## 2016-02-11 DIAGNOSIS — Z880 Allergy status to penicillin: Secondary | ICD-10-CM

## 2016-02-11 DIAGNOSIS — F1290 Cannabis use, unspecified, uncomplicated: Secondary | ICD-10-CM

## 2016-02-11 DIAGNOSIS — F333 Major depressive disorder, recurrent, severe with psychotic symptoms: Secondary | ICD-10-CM

## 2016-02-11 DIAGNOSIS — F17210 Nicotine dependence, cigarettes, uncomplicated: Secondary | ICD-10-CM

## 2016-02-11 DIAGNOSIS — Z79899 Other long term (current) drug therapy: Secondary | ICD-10-CM

## 2016-02-11 LAB — PREGNANCY, URINE: PREG TEST UR: NEGATIVE

## 2016-02-11 MED ORDER — NICOTINE POLACRILEX 2 MG MT GUM
2.0000 mg | CHEWING_GUM | OROMUCOSAL | Status: DC | PRN
  Administered 2016-02-12: 2 mg via ORAL

## 2016-02-11 MED ORDER — IBUPROFEN 400 MG PO TABS
400.0000 mg | ORAL_TABLET | Freq: Four times a day (QID) | ORAL | Status: DC | PRN
  Administered 2016-02-11 – 2016-02-13 (×3): 400 mg via ORAL
  Filled 2016-02-11 (×3): qty 1
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
## 2016-02-11 NOTE — [facility] Group Notes (Signed)
[facility] LCSW Group Therapy Note  02/11/2016  At 10 - 11 AM  Type of Therapy and Topic:  Group Therapy: Avoiding Self-Sabotaging and Enabling Behaviors  Participation Level:  Active  Participation Quality:  Attentive and Sharing  Affect:  Appropriate  Cognitive:  Alert and Oriented  Insight:  Developing/Improving  Engagement in Therapy:  Engaged   Therapeutic models used Cognitive Behavioral Therapy Person-Centered Therapy Motivational Interviewing   Summary of Patient Progress: The main focus of today's process group was to explain to the adolescent what "self-sabotage" means and use Motivational Interviewing to discuss what benefits, negative or positive, were involved in a self-identified self-sabotaging behavior. We then talked about reasons the patient may want to change the behavior and their current desire to change. Patient shared how she 'became overwhelmed by life' and processed some of her stressors. Patient identified with negative thinking and negative self talk.   Carney Bernatherine C Harrill, LCSW
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
## 2016-02-11 NOTE — [facility] Group Notes (Signed)
[facility] Group Notes:  (Nursing/MHT/Case Management/Adjunct)  Date:  02/11/2016  Time:  1315  Type of Therapy:  Nurse Education - Suicide Safety Plan  Participation Level:  Active  Participation Quality:  Appropriate  Affect:  Appropriate  Cognitive:  Appropriate  Insight:  Improving  Engagement in Group:  Engaged  Modes of Intervention:  Discussion, Education and Support  Summary of Progress/Problems: Clinical research associateWriter led patients in exercise of completing SSP. Copy given to patient and filed on chart.     Jordan CapronFriedman, [name] [facility] 02/11/2016, 1400

## 2016-02-11 NOTE — Progress Notes (Signed)
Pt did not attend AA/NA group meeting.
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
## 2016-02-11 NOTE — Progress Notes (Signed)
D: Patient up and visible in the milieu. Spoke with patient 1:1. Rates sleep as good, appetite as fair, energy as normal and concentration as good. Patient's affect blunted, mood appropriate. Rating depression at a 2/10, hopelessness at a 0/10 and anxiety at a 0/10. States goal for today is to "write down my goals for the next 6 months and how and what I need to do to get there." Complaining of menstrual cramping rated at a 7/10. No other physical complaints.   A: Medicated per orders, tylenol prn given. Emotional support offered and self inventory reviewed along with Suicide Safety Plan.   R: Patient verbalizes understanding of POC. On reassess, patient's pain is a 3/10. Patient denies SI/HI and remains safe on level III obs. Patient focused on discharge.

## 2016-02-11 NOTE — Plan of Care (Signed)
Problem: Safety: Goal: Ability to disclose and discuss suicidal ideas will improve Outcome: Progressing Patient denying all SI at this time.  Problem: Medication: Goal: Compliance with prescribed medication regimen will improve Outcome: Progressing Patient is med compliant.

## 2016-02-12 LAB — LIPID PANEL
Cholesterol: 222 mg/dL — ABNORMAL HIGH (ref 0–200)
HDL: 40 mg/dL — ABNORMAL LOW (ref >40)
LDL CALC: 155 mg/dL — AB (ref 0–99)
TRIGLYCERIDES: 136 mg/dL (ref <150)
Total CHOL/HDL Ratio: 5.6 RATIO
VLDL: 27 mg/dL (ref 0–40)

## 2016-02-12 NOTE — Plan of Care (Signed)
Problem: Physical Regulation: Goal: Ability to maintain clinical measurements within normal limits will improve Outcome: Progressing Patient's VSS.  Problem: Safety: Goal: Ability to identify and utilize support systems that promote safety will improve Outcome: Progressing Patient verbalizes those she can turn to for support and those she cannot. Listed such on her SSP.

## 2016-02-12 NOTE — [facility] Group Notes (Signed)
   [facility] LCSW Group Therapy Note   02/12/2016  10:00 to 10:45 AM   Type of Therapy and Topic: Group Therapy: Feelings Around Returning Home & Establishing a Supportive Framework and Activity to Identify signs of Improvement or Decompensation   Participation Level:  Active   Description of Group:  Patients first processed thoughts and feelings about up coming discharge. These included fears of upcoming changes, lack of change, new living environments, judgements and expectations from others and overall stigma of MH issues. We then discussed what is a supportive framework? What does it look like feel like and how do I discern it from and unhealthy non-supportive network? Learn how to cope when supports are not helpful and don't support you. Discuss what to do when your family/friends are not supportive.   Therapeutic Goals Addressed in Processing Group:  1. Patient will identify one healthy supportive network that they can use at discharge. 2. Patient will identify one factor of a supportive framework and how to tell it from an unhealthy network. 3. Patient able to identify one coping skill to use when they do not have positive supports from others. 4. Patient will demonstrate ability to communicate their needs through discussion and/or role plays.  Summary of Patient Progress:  Pt engaged easily during group session. As patients processed their anxiety about discharge and described healthy supports patient  Processed feeling related to move after her upcoming wedding.  Patient is open to creating new connections following move.  Patient chose a visual to represent decompensation as homelessness and improvement as her upcoming wedding.   Jordan Bernatherine C Harrill, LCSW

## 2016-02-12 NOTE — [facility] Suicide Risk Assessment (Signed)
[facility] INPATIENT:  Family/Significant Other Suicide Prevention Education  Suicide Prevention Education:  Contact Attempts: mother, Marinell Blightamela Havlin, [phone], (name of family member/significant other) has been identified by the patient as the family member/significant other with whom the patient will be residing, and identified as the person(s) who will aid the patient in the event of a mental health crisis.  With written consent from the patient, two attempts were made to provide suicide prevention education, prior to and/or following the patient's discharge.  We were unsuccessful in providing suicide prevention education.  A suicide education pamphlet was given to the patient to share with family/significant other.  Date and time of first attempt:02/12/16 @ 1120.  Date and time of second attempt:02/12/16 @ [id number]  Josephus Harriger Hulan FrayJ Shamonique Battiste 02/12/2016, 11:19 AM

## 2016-02-12 NOTE — [facility] Suicide Risk Assessment (Signed)
[facility] INPATIENT:  Family/Significant Other Suicide Prevention Education  Suicide Prevention Education:  Education Completed; Charlean Merlrevis Newman, [phone],  (name of family member/significant other) has been identified by the patient as the family member/significant other with whom the patient will be residing, and identified as the person(s) who will aid the patient in the event of a mental health crisis (suicidal ideations/suicide attempt).  With written consent from the patient, the family member/significant other has been provided the following suicide prevention education, prior to the and/or following the discharge of the patient.  The suicide prevention education provided includes the following:  Suicide risk factors  Suicide prevention and interventions  National Suicide Hotline telephone number  [facility] assessment telephone number  [facility] 911  [facility] and/or [facility]idential Mobile Crisis [facility] telephone number  Request made of family/significant other to:  Remove weapons (e.g., guns, rifles, knives), all items previously/currently identified as safety concern.    Remove drugs/medications (over-the-counter, prescriptions, illicit drugs), all items previously/currently identified as a safety concern.  The family member/significant other verbalizes understanding of the suicide prevention education information provided.  The family member/significant other agrees to remove the items of safety concern listed above.  Beverly Sessionsywan J Deborra Phegley 02/12/2016, 11:19 AM

## 2016-02-12 NOTE — Progress Notes (Signed)
D: Patient up and visible in the milieu. Spoke with patient 1:1. Presents as superficial, minimizing events prior to admission. While chart indicates patient's fiancee died 2 weeks ago, patient reports they are marrying in May and she will become a stepmom to his children. States she sought help because she felt overwhelmed about upcoming life changes. Rates sleep as good, appetite as good, energy as normal and concentration as good. Patient's affect flat, mood pleasant but slightly anxious. Patient reports she is anxious discharge. Rating depression, hopelessness and anxiety at a 0/10. States goal for today is to "write down goals for the next 6 months." Reports menstrual cramping of a 7/10 and requests prn for pain.   A: Medicated per orders, advil prn given. Emotional support offered and self inventory reviewed. Suicide Safety Plan on chart and patient provided copy. Encouraged patient to strengthen coping skills for upcoming lifestyle changes.   R: Patient verbalizes understanding of POC. Will reassess pain in 1 hour. Patient denies SI/HI and remains safe on level III obs.

## 2016-02-12 NOTE — Progress Notes (Signed)
D: Pt at the time of assessment denied any form of depression, anxiety, pain, SI, HI or AVH; states, "I 'm ready to go home; I feel good." Pt was however flat and withdrawn to self even while in the dayroom. Pt remained calm and cooperative.  A: Medications offered as prescribed.  Support, encouragement, and safe environment provided.  15-minute safety checks continue. R: Pt was med compliant.  Pt attended wrap-up group on the 400 hall as ordered. Safety checks continue.

## 2016-02-12 NOTE — Progress Notes (Signed)
D    Pt is appropriate and pleasant    She reports she is ready to be discharged tomorrow and wants to be out of here by 11 because of her ride situation A   Verbal support given   Medication administered and effectiveness monitored  Q 15 min checks R   Pt is safe at present and receptive to verbal support

## 2016-02-12 NOTE — [facility] Group Notes (Signed)
[facility] Group Notes:  (Nursing/MHT/Case Management/Adjunct)  Date:  02/12/2016  Time:  1315  Type of Therapy:  Nurse Education  Participation Level:  Active  Participation Quality:  Attentive  Affect:  Appropriate  Cognitive:  Appropriate  Insight:  Lacking  Engagement in Group:  Engaged  Modes of Intervention:  Discussion, Education and Support  Summary of Progress/Problems: Patient attended and participated in group therapy.  Merian CapronFriedman, Keante Urizar [facility] 02/12/2016, 1400

## 2016-02-12 NOTE — Progress Notes (Signed)
Nutrition Brief Note  Patient identified on the Malnutrition Screening Tool (MST) Report  Patient's weight has increased since December 2017. Per chart review, [facility]  Wt Readings from Last 15 Encounters:  02/10/16 220 lb (99.8 kg)  02/09/16 227 lb 2 oz (103 kg)  01/04/16 215 lb 6 oz (97.7 kg)  09/17/15 207 lb 3.2 oz (94 kg)  08/29/15 215 lb (97.5 kg)  11/27/14 209 lb (94.8 kg)  11/20/14 209 lb (94.8 kg)  10/06/14 220 lb (99.8 kg)  07/19/14 204 lb (92.5 kg)  06/17/14 211 lb (95.7 kg)  05/30/14 210 lb (95.3 kg)  02/10/14 220 lb (99.8 kg)  10/19/13 210 lb (95.3 kg)  09/09/13 210 lb (95.3 kg)  05/07/13 210 lb (95.3 kg)    Body mass index is 37.76 kg/m. Patient meets criteria for obesity based on current BMI.   Labs and medications reviewed.   No nutrition interventions warranted at this time. If nutrition issues arise, please consult RD.   Tilda FrancoLindsey Kafi Dotter, MS, RD, LDN Pager: [phone] After Hours Pager: [phone]

## 2016-02-12 NOTE — Progress Notes (Signed)
[facility] MD Progress Note  02/12/2016 1:36 PM Jordan Hanson  MRN:  [id number] Subjective:   21 yo Caucasian, single, lives with roommates. Background history of Bipolar disorder NOS and Borderline personality traits. Presented to the emergency room seeking in company of her roommmates. Reports feeling overwhelmed in the past couple of weeks. Lost her fiancee unexpectedly and tragically two weeks ago. Has been feeling very depressed since then. Patient took and OD five days earlier. She presented with superficial cuts to her left arm. Patient has been unemployed for a while. She is due to start a new job soon. She was positive for THC at presentation. Other laboratory parameters are essentially within normal.   Historically has been cutting since the age of 8 years. Multiple impulsive suicidal behavior over the years. Has had multiple hospitalization over the years.   Nursing staff reports that she has been pleasant. She has been interacting appropriately with peers. She has been grooming self appropriate. She reports feeling good and eager to start her new job. She has not voiced any thoughts of suicide. She slept well last night. She has been eating her meals appropriately. No behavioral issues.  At interview today, she tells me that she has been feeling normal since she has been here. Not overwhelmed by her recent loss. She is focused on starting her new job. She has been reading here. Says she is able to focus and remember what she read. She showed me puzzles she has been working. Seems excited discussing how good she is good at it. No evidence of psychosis. No evidence of dissociation. No preoccupation with loss of her fiancee.   Principal Problem: <principal problem not specified> Diagnosis:   Patient Active Problem List   Diagnosis Date Noted  . Bipolar 1 disorder, depressed (HCC) [F31.9] 02/10/2016  . Suicidal ideation [R45.851] 07/19/2014  . MDD (major depressive disorder), recurrent severe,  without psychosis (HCC) [F33.2] 06/19/2014  . MDD (major depressive disorder) [F32.9] 06/17/2014   Total Time spent with patient: 20 minutes  Past Psychiatric History: As in H&P  Past Medical History:  Past Medical History:  Diagnosis Date  . ADHD (attention deficit hyperactivity disorder)   . Bipolar disorder (HCC)   . Depression     Past Surgical History:  Procedure Laterality Date  . TONSILLECTOMY     Family History: History reviewed. No pertinent family history. Family Psychiatric  History: As in H&P Social History:  History  Alcohol Use No    Comment: occ     History  Drug Use  . Types: Marijuana    Comment: smokes weed every day    Social History   Social History  . Marital status: Single    Spouse name: N/A  . Number of children: N/A  . Years of education: N/A   Social History Main Topics  . Smoking status: Current Every Day Smoker    Packs/day: 1.00    Types: Cigarettes  . Smokeless tobacco: Never Used  . Alcohol use No     Comment: occ  . Drug use: Yes    Types: Marijuana     Comment: smokes weed every day  . Sexual activity: Not Asked   Other Topics Concern  . None   Social History Narrative  . None   Additional Social History:    Pain Medications: Pt denies Prescriptions: see MAR Over the Counter: Pt denies History of alcohol / drug use?: Yes Longest period of sobriety (when/how long): unknown Name of Substance 1:  Cannabis 1 - Age of First Use: 20-sts "just started" 1 - Amount (size/oz): 6 blunts per day 1 - Frequency: daily- "if I have it" 1 - Duration: ongoing 1 - Last Use / Amount: 02/09/16 Name of Substance 2: Alcohol 2 - Age of First Use: teens 2 - Amount (size/oz): varies 2 - Frequency: infrequently 2 - Duration: ongoing 2 - Last Use / Amount: 2 yrs go     Sleep: Good  Appetite:  Good  Current Medications: Current Facility-Administered Medications  Medication Dose Route Frequency Provider Last Rate Last Dose  .  acetaminophen (TYLENOL) tablet 650 mg  650 mg Oral Q6H PRN Laveda Abbe, NP   650 mg at 02/11/16 4098  . alum & mag hydroxide-simeth (MAALOX/MYLANTA) 200-200-20 MG/5ML suspension 30 mL  30 mL Oral Q4H PRN Laveda Abbe, NP      . dicyclomine (BENTYL) tablet 20 mg  20 mg Oral BID Laveda Abbe, NP   20 mg at 02/12/16 0817  . divalproex (DEPAKOTE ER) 24 hr tablet 1,000 mg  1,000 mg Oral QHS Laveda Abbe, NP   1,000 mg at 02/11/16 2106  . hydrOXYzine (ATARAX/VISTARIL) tablet 25 mg  25 mg Oral Q6H PRN Laveda Abbe, NP   25 mg at 02/11/16 2028  . ibuprofen (ADVIL,MOTRIN) tablet 400 mg  400 mg Oral Q6H PRN Jackelyn Poling, NP   400 mg at 02/12/16 1025  . magnesium hydroxide (MILK OF MAGNESIA) suspension 30 mL  30 mL Oral Daily PRN Laveda Abbe, NP      . nicotine polacrilex (NICORETTE) gum 2 mg  2 mg Oral PRN Craige Cotta, MD   2 mg at 02/12/16 0818  . QUEtiapine (SEROQUEL) tablet 300 mg  300 mg Oral QHS Laveda Abbe, NP   300 mg at 02/11/16 2106  . sertraline (ZOLOFT) tablet 150 mg  150 mg Oral Daily Laveda Abbe, NP   150 mg at 02/12/16 0817  . traZODone (DESYREL) tablet 50 mg  50 mg Oral QHS PRN Laveda Abbe, NP        Lab Results:  Results for orders placed or performed during the hospital encounter of 02/10/16 (from the past 48 hour(s))  Pregnancy, urine     Status: None   Collection Time: 02/11/16 12:58 PM  Result Value Ref Range   Preg Test, Ur NEGATIVE NEGATIVE    Comment:        THE SENSITIVITY OF THIS METHODOLOGY IS >20 mIU/mL. Performed at [facility], [address], [place], [place] [address]   Lipid panel     Status: Abnormal   Collection Time: 02/12/16  6:18 AM  Result Value Ref Range   Cholesterol 222 (H) 0 - 200 mg/dL   Triglycerides 782 <956 mg/dL   HDL 40 (L) >21 mg/dL   Total CHOL/HDL Ratio 5.6 RATIO   VLDL 27 0 - 40 mg/dL   LDL Cholesterol 308 (H) 0 - 99 mg/dL    Comment:         Total Cholesterol/HDL:CHD Risk Coronary Heart Disease Risk Table                     Men   Women  1/2 Average Risk   3.4   3.3  Average Risk       5.0   4.4  2 X Average Risk   9.6   7.1  3 X Average Risk  23.4  11.0        Use the calculated Patient Ratio above and the CHD Risk Table to determine the patient's CHD Risk.        ATP III CLASSIFICATION (LDL):  <100     mg/dL   Optimal  161-096  mg/dL   Near or Above                    Optimal  130-159  mg/dL   Borderline  045-409  mg/dL   High  >811     mg/dL   Very High Performed at [facility], [address] [place], [place] [address]     Blood Alcohol level:  Lab Results  Component Value Date   [facility] <5 02/09/2016   ETH <5 07/19/2014    Metabolic Disorder Labs: Lab Results  Component Value Date   HGBA1C 5.7 (H) 06/19/2014   MPG 117 06/19/2014   MPG 111 09/12/2010   No results found for: PROLACTIN Lab Results  Component Value Date   CHOL 222 (H) 02/12/2016   TRIG 136 02/12/2016   HDL 40 (L) 02/12/2016   CHOLHDL 5.6 02/12/2016   VLDL 27 02/12/2016   LDLCALC 155 (H) 02/12/2016   LDLCALC 126 (H) 06/19/2014    Physical Findings: AIMS: Facial and Oral Movements Muscles of Facial Expression: None, normal Lips and Perioral Area: None, normal Jaw: None, normal Tongue: None, normal,Extremity Movements Upper (arms, wrists, hands, fingers): None, normal Lower (legs, knees, ankles, toes): None, normal, Trunk Movements Neck, shoulders, hips: None, normal, Overall Severity Severity of abnormal movements (highest score from questions above): None, normal Incapacitation due to abnormal movements: None, normal Patient's awareness of abnormal movements (rate only patient's report): No Awareness, Dental Status Current problems with teeth and/or dentures?: No Does patient usually wear dentures?: No  CIWA:    COWS:     Musculoskeletal: Strength & Muscle Tone: within normal limits Gait & Station:  normal Patient leans: N/A  Psychiatric Specialty Exam: Physical Exam  Constitutional: She is oriented to person, place, and time. She appears well-developed and well-nourished.  HENT:  Head: Normocephalic and atraumatic.  Eyes: Conjunctivae and EOM are normal. Pupils are equal, round, and reactive to light.  Neck: Normal range of motion. Neck supple.  Cardiovascular: Normal rate, regular rhythm and normal heart sounds.   Respiratory: Effort normal and breath sounds normal.  GI: Soft. Bowel sounds are normal.  Musculoskeletal: Normal range of motion.  Neurological: She is alert and oriented to person, place, and time. She has normal reflexes.  Psychiatric:  As above    ROS  Blood pressure 107/81, pulse (!) 102, temperature 98.7 F (37.1 C), resp. rate 18, height 5\' 4"  (1.626 m), weight 99.8 kg (220 lb), last menstrual period 01/19/2016, SpO2 98 %.Body mass index is 37.76 kg/m.  General Appearance: Well groomed, good relatedness. Appropriate behavior. Mobilizing affcet appropriately. Not in any distress. Not internally stimulated.   Eye Contact:  Good  Speech:  Clear and Coherent and Normal Rate  Volume:  Normal  Mood:  Euthymic  Affect:  Appropriate and Full Range  Thought Process:  Linear  Orientation:  Full (Time, Place, and Person)  Thought Content:  No thoughts of violence. No preoccupation with violent thought, no delusional theme. No hallucination in any modality  Suicidal Thoughts:  No  Homicidal Thoughts:  No  Memory:  WNL  Judgement:  Good  Insight:  Good  Psychomotor Activity:  Normal  Concentration:  Concentration: Good  Recall:  Good  Fund of Knowledge:  Good  Language:  Good  Akathisia:  No  Handed:    AIMS (if indicated):     Assets:  Communication Skills Desire for Improvement Financial Resources/Insurance Housing Physical Health Resilience Social Support  ADL's:  Intact  Cognition:  WNL  Sleep:  Number of Hours: 6.75     Treatment Plan  Summary: Patient has been stable mentally since she ahs been here. She is currently on her home medications. She is future oriented and seems to be coping well with her loss. She has a therapist and is open to DBT.   Plan: 1. Continue current regimen 2. Continue to monitor mood, behavior and interaction with peers 3. SW would coordinate aftercare 4. Likely discharge tomorrow.   Georgiann CockerVincent A Ernesto Lashway, MD 02/12/2016, 1:36 PM

## 2016-02-13 DIAGNOSIS — F603 Borderline personality disorder: Secondary | ICD-10-CM

## 2016-02-13 LAB — PROLACTIN: Prolactin: 48.7 ng/mL — ABNORMAL HIGH (ref 4.8–23.3)

## 2016-02-13 LAB — HEMOGLOBIN A1C
Hgb A1c MFr Bld: 5.2 % (ref 4.8–5.6)
Mean Plasma Glucose: 103 mg/dL

## 2016-02-13 MED ORDER — NICOTINE POLACRILEX 2 MG MT GUM: 2.0000 mg | CHEWING_GUM | OROMUCOSAL | 0 refills | Status: DC | PRN

## 2016-02-13 MED ORDER — HYDROXYZINE HCL 25 MG PO TABS: 25.0000 mg | ORAL_TABLET | Freq: Four times a day (QID) | ORAL | 0 refills | Status: DC | PRN

## 2016-02-13 MED ORDER — QUETIAPINE FUMARATE 300 MG PO TABS: 300.0000 mg | ORAL_TABLET | Freq: Every day | ORAL | 0 refills | Status: DC

## 2016-02-13 MED ORDER — DIVALPROEX SODIUM ER 500 MG PO TB24: 1000.0000 mg | ORAL_TABLET | Freq: Every day | ORAL | 0 refills | Status: DC

## 2016-02-13 MED ORDER — DICYCLOMINE HCL 20 MG PO TABS: 20.0000 mg | ORAL_TABLET | Freq: Two times a day (BID) | ORAL | 0 refills | Status: DC

## 2016-02-13 MED ORDER — SERTRALINE HCL 50 MG PO TABS: 150.0000 mg | ORAL_TABLET | Freq: Every day | ORAL | 0 refills | Status: DC

## 2016-02-13 MED ORDER — TRAZODONE HCL 50 MG PO TABS: 50.0000 mg | ORAL_TABLET | Freq: Every evening | ORAL | 0 refills | Status: DC | PRN

## 2016-02-13 NOTE — Discharge Summary (Signed)
Physician Discharge Summary Note  Patient:  Jordan HalterKatia B Hanson is an 21 y.o., female MRN:  [id number] DOB:  September 11, 1995 Patient phone:  [phone] (home)  Patient address:   [address] [place] [id number],  Total Time spent with patient: 30 minutes  Date of Admission:  02/10/2016 Date of Discharge: 02/13/2016  Reason for Admission:  Developed thoughts to overdose, self cutting  Principal Problem: Bipolar 1 disorder, depressed (HCC) Discharge Diagnoses: Patient Active Problem List   Diagnosis Date Noted  . Borderline personality disorder [F60.3] 02/13/2016  . Bipolar 1 disorder, depressed (HCC) [F31.9] 02/10/2016  . Suicidal ideation [R45.851] 07/19/2014  . MDD (major depressive disorder), recurrent severe, without psychosis (HCC) [F33.2] 06/19/2014  . MDD (major depressive disorder) [F32.9] 06/17/2014    Past Psychiatric History:  See HPI  Past Medical History:  Past Medical History:  Diagnosis Date  . ADHD (attention deficit hyperactivity disorder)   . Bipolar disorder (HCC)   . Depression     Past Surgical History:  Procedure Laterality Date  . TONSILLECTOMY     Family History: History reviewed. No pertinent family history. Family Psychiatric  History: see HPI Social History:  History  Alcohol Use No    Comment: occ     History  Drug Use  . Types: Marijuana    Comment: smokes weed every day    Social History   Social History  . Marital status: Single    Spouse name: N/A  . Number of children: N/A  . Years of education: N/A   Social History Main Topics  . Smoking status: Current Every Day Smoker    Packs/day: 1.00    Types: Cigarettes  . Smokeless tobacco: Never Used  . Alcohol use No     Comment: occ  . Drug use: Yes    Types: Marijuana     Comment: smokes weed every day  . Sexual activity: Not Asked   Other Topics Concern  . None   Social History Narrative  . 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 Chart review indicated that Jordan Hanson was planning to overdose 5 days ago & had made a superficial cut to her wrist just 2 days ago. She does have hx of multiple suicide attempts.    Jordan Hanson was admitted for Bipolar 1 disorder, depressed (HCC) and crisis management.  Patient was treated with medications with their indications listed below in detail under Medication List.  Medical problems were identified and treated as needed.  Home medications were restarted as appropriate.  Improvement was monitored by observation and Jordan Hanson daily report of symptom reduction.  Emotional and mental status was monitored by daily self inventory reports completed by Jordan Hanson and clinical staff.  Patient reported continued improvement, denied any new concerns.  Patient had been compliant on medications and denied side effects.  Support and encouragement was provided.    Jordan Hanson was evaluated by the treatment team for stability and plans for continued recovery upon discharge.  Patient was offered further treatment options upon discharge including Residential, Intensive Outpatient and Outpatient treatment. Patient will follow up with agency listed below for medication management and counseling.  Encouraged patient to maintain satisfactory support network and home environment.  Advised to adhere to medication compliance  and outpatient treatment follow up.  Prescriptions provided.       Jordan Hanson motivation was an integral factor for scheduling further treatment.  Employment, transportation, bed availability, health status, family support, and any pending legal issues were also considered during patient's hospital stay.  Upon completion of this admission the patient was both mentally and medically stable for discharge denying  suicidal/homicidal ideation, auditory/visual/tactile hallucinations, delusional thoughts and paranoia.      Physical Findings: AIMS: Facial and Oral Movements Muscles of Facial Expression: None, normal Lips and Perioral Area: None, normal Jaw: None, normal Tongue: None, normal,Extremity Movements Upper (arms, wrists, hands, fingers): None, normal Lower (legs, knees, ankles, toes): None, normal, Trunk Movements Neck, shoulders, hips: None, normal, Overall Severity Severity of abnormal movements (highest score from questions above): None, normal Incapacitation due to abnormal movements: None, normal Patient's awareness of abnormal movements (rate only patient's report): No Awareness, Dental Status Current problems with teeth and/or dentures?: No Does patient usually wear dentures?: No  CIWA:    COWS:     Musculoskeletal: Strength & Muscle Tone: within normal limits Gait & Station: normal Patient leans: N/A  Psychiatric Specialty Exam:  SEE MD SRA Physical Exam  Nursing note and vitals reviewed.   ROS  Blood pressure 111/82, pulse 99, temperature 98.2 F (36.8 C), resp. rate 16, height 5\' 4"  (1.626 m), weight 99.8 kg (220 lb), last menstrual period 01/19/2016, SpO2 98 %.Body mass index is 37.76 kg/m.    Have you used any form of tobacco in the last 30 days? (Cigarettes, Smokeless Tobacco, Cigars, and/or Pipes): Yes  Has this patient used any form of tobacco in the last 30 days? (Cigarettes, Smokeless Tobacco, Cigars, and/or Pipes) Yes, N/A  Blood Alcohol level:  Lab Results  Component Value Date   ETH <5 02/09/2016   ETH <5 07/19/2014    Metabolic Disorder Labs:  Lab Results  Component Value Date   HGBA1C 5.2 02/12/2016   MPG 103 02/12/2016   MPG 117 06/19/2014   Lab Results  Component Value Date   PROLACTIN 48.7 (H) 02/12/2016   Lab Results  Component Value Date   CHOL 222 (H) 02/12/2016   TRIG 136 02/12/2016   HDL 40 (L) 02/12/2016   CHOLHDL 5.6 02/12/2016    VLDL 27 02/12/2016   LDLCALC 155 (H) 02/12/2016   LDLCALC 126 (H) 06/19/2014    See Psychiatric Specialty Exam and Suicide Risk Assessment completed by Attending Physician prior to discharge.  Discharge destination:  Home  Is patient on multiple antipsychotic therapies at discharge:  No   Has Patient had three or more failed trials of antipsychotic monotherapy by history:  No  Recommended Plan for Multiple Antipsychotic Therapies: NA   Allergies as of 02/13/2016      Reactions   Penicillins Swelling, Rash   Has patient had a PCN reaction causing immediate rash, facial/tongue/throat swelling, SOB or lightheadedness with hypotension:YES Has patient had a PCN reaction causing severe rash involving mucus membranes or skin necrosis: NO Has patient had a PCN reaction that required hospitalization NO Has patient had a PCN reaction occurring within the last 10 years: NO If all of the above answers are "NO", then may proceed with Cephalosporin use.      Medication List    STOP taking these medications   Norgestimate-Ethinyl Estradiol Triphasic 0.18/0.215/0.25 MG-35 MCG tablet     TAKE these medications     Indication  dicyclomine 20 MG tablet Commonly known as:  BENTYL Take 1  tablet (20 mg total) by mouth 2 (two) times daily.  Indication:  Irritable Bowel Syndrome   divalproex 500 MG 24 hr tablet Commonly known as:  DEPAKOTE ER Take 2 tablets (1,000 mg total) by mouth at bedtime.  Indication:  Mood Control   hydrOXYzine 25 MG tablet Commonly known as:  ATARAX/VISTARIL Take 1 tablet (25 mg total) by mouth every 6 (six) hours as needed for anxiety.  Indication:  Anxiety Neurosis   nicotine polacrilex 2 MG gum Commonly known as:  NICORETTE Take 1 each (2 mg total) by mouth as needed for smoking cessation.  Indication:  Nicotine Addiction   QUEtiapine 300 MG tablet Commonly known as:  SEROQUEL Take 1 tablet (300 mg total) by mouth at bedtime.  Indication:  Mood Control    sertraline 50 MG tablet Commonly known as:  ZOLOFT Take 3 tablets (150 mg total) by mouth daily. Start taking on:  02/14/2016  Indication:  Major Depressive Disorder   traZODone 50 MG tablet Commonly known as:  DESYREL Take 1 tablet (50 mg total) by mouth at bedtime as needed for sleep.  Indication:  Trouble Sleeping      Follow-up Information    [facility] Follow up on 02/14/2016.   Specialty:  Behavioral Health Why:  Appt with Junie Bame on 02/14/15 (Tuesday) at 10:00AM for hospital follow-up/therapy. Your appt with Tamela Oddi for 2/5 has been rescheduled for 2/12 (Monday) at 12:20PM. Thank you. PLEASE CALL OFFICE IMMEDIATELY IF YOU MUST RESCHEDULE APPT(S).  Contact information: [address] [place] [address] [phone]           Follow-up recommendations:  Activity:  as tol Diet:  as tol  Comments:  1.  Take all your medications as prescribed.   2.  Report any adverse side effects to outpatient provider. 3.  Patient instructed to not use alcohol or illegal drugs while on prescription medicines. 4.  In the event of worsening symptoms, instructed patient to call 911, the crisis hotline or go to nearest emergency room for evaluation of symptoms.  Signed: Lindwood Qua, NP [facility] 02/13/2016, 1:31 PM

## 2016-02-13 NOTE — BHH Suicide Risk Assessment (Signed)
[facility] Discharge Suicide Risk Assessment   Principal Problem: Bipolar 1 disorder, depressed [facility]) Discharge Diagnoses:  Patient Active Problem List   Diagnosis Date Noted  . Borderline personality disorder [F60.3] 02/13/2016  . Bipolar 1 disorder, depressed (HCC) [F31.9] 02/10/2016  . Suicidal ideation [R45.851] 07/19/2014  . MDD (major depressive disorder), recurrent severe, without psychosis (HCC) [F33.2] 06/19/2014  . MDD (major depressive disorder) [F32.9] 06/17/2014    Total Time spent with patient: 30 minutes  Musculoskeletal: Strength & Muscle Tone: within normal limits Gait & Station: normal Patient leans: N/A  Psychiatric Specialty Exam: Review of Systems  Constitutional: Negative.   HENT: Negative.   Eyes: Negative.   Respiratory: Negative.   Cardiovascular: Negative.   Gastrointestinal: Negative.   Genitourinary: Negative.   Musculoskeletal: Negative.   Skin: Negative.   Neurological: Negative.   Endo/Heme/Allergies: Negative.   Psychiatric/Behavioral: Negative for depression, hallucinations, memory loss and suicidal ideas. The patient is not nervous/anxious and does not have insomnia.     Blood pressure 111/82, pulse 99, temperature 98.2 F (36.8 C), resp. rate 16, height 5\' 4"  (1.626 m), weight 99.8 kg (220 lb), last menstrual period 01/19/2016, SpO2 98 %.Body mass index is 37.76 kg/m.  General Appearance: Calm and cooperative. Good rapport. Appropriate behavior. Not internally stimulated  Eye Contact::  Good  Speech:  Spontaneous, normal prosody. 409  Volume:  Normal  Mood:  Euthymic  Affect:  Full range and appropriate  Thought Process:  Linear and well organized.   Orientation:  Full (Time, Place, and Person)  Thought Content:  No delusional theme. No evidence of dissociation. No Hallucination in any modality. No thoughts of violence   Suicidal Thoughts:  No  Homicidal Thoughts:  No  Memory:  WNL  Judgement:  Good  Insight:  Good  Psychomotor  Activity:  Normal  Concentration:  Good  Recall:  Good  Fund of Knowledge:Good  Language: Good  Akathisia:  No  Handed:    AIMS (if indicated):     Assets:  Communication Skills Desire for Improvement Financial Resources/Insurance Housing Physical Health Resilience Social Support Talents/Skills Transportation  Sleep:  Number of Hours: 6  Cognition: WNL  ADL's:  Intact   Clinical Assessment::   21 yo Caucasian, single, lives with roommates. Background history of Bipolar disorder NOS and Borderline personality traits. Presented to the emergency room seeking in company of her roommmates. Reports feeling overwhelmed in the past couple of weeks. Lost her fiancee unexpectedly and tragically two weeks ago. Has been feeling very depressed since then. Patient took and OD five days earlier. She presented with superficial cuts to her left arm. Patient has been unemployed for a while. She is due to start a new job soon. She was positive for THC at presentation. Other laboratory parameters are essentially within normal.  Historically has been cutting since the age of 8 years. Multiple impulsive suicidal behavior over the years. Has had multiple hospitalization over the years.   Seen today. Reports being in good spirits. No internal distress. No suicidal thoughts. Grieving normally. She is optimistic about the future. She is looking forward to starting her new job today. No evidence of psychosis. No evidence of mania. No evidence of depression. No evidence of dissociation. She is not craving for substances. No cognitive impairment. She does not have access to weapons at home. She has been tolerating her medications well.  Nursing staff reports that she has been pleasant. She has been interacting appropriately. Normal biological and ego functions. No futility thoughts.  Treatment team agrees that she is at baseline. Aftercare plans for lower level of care in place.   Demographic Factors:   Caucasian  Loss Factors: Recent bereavement  Historical Factors: Prior suicide attempts and Impulsivity  Risk Reduction Factors:   Employed, Living with another person, especially a relative, Positive social support, Positive therapeutic relationship and Positive coping skills or problem solving skills  Continued Clinical Symptoms:  Has completely resolved.   Cognitive Features That Contribute To Risk:  None    Suicide Risk:  Minimal: No identifiable suicidal ideation. At this point she is coping well with her loss. She is starting a new job and seems to be ready for role transition. Patient understands the effects of substances in her judgment. She has agreed to call emergency services if overwhelmed.   Follow-up Information    [facility] Follow up.   Specialty:  Behavioral Health Why:  Social Worker will confirm your next appointment with Tamela OddiJo Hughes for medication management and with Junie BamePaula Katz for therapy. Contact information: [address] [place] [address] [phone]           Plan Of Care/Follow-up recommendations:   Patient is coping better with her recent loss. She has a therapist. She is willing to engage in DBT and would discuss this further with her therapist.   Plan: 1. Continue current psychotropic medications 2. Mental health follow up  Georgiann CockerVincent A Lalonnie Shaffer, MD 02/13/2016, 10:08 AM

## 2016-02-13 NOTE — Progress Notes (Signed)
Recreation Therapy Notes  Date: 02/13/16 Time: 0930 Location: [address] Dayroom  Group Topic: Stress Management  Goal Area(s) Addresses:  Patient will verbalize importance of using healthy stress management.  Patient will identify positive emotions associated with healthy stress management.   Intervention: Stress Management  Activity :  Meditation.  LRT introduced the stress management technique of meditation.  LRT played a meditation from the Calm app to give patients the opportunity to engage in the activity.  Patients were to follow along as LRT played the meditation.  Education:  Stress Management, Discharge Planning.   Education Outcome: Acknowledges edcuation/In group clarification offered/Needs additional education  Clinical Observations/Feedback: Pt did not attend group.    Caroll RancherMarjette Gregoria Selvy, LRT/CTRS         Caroll RancherLindsay, Toshiye Kever A 02/13/2016 3:32 PM

## 2016-02-13 NOTE — Tx Team (Signed)
Interdisciplinary Treatment and Diagnostic Plan Update  02/13/2016 Time of Session: 9:30AM Jordan Hanson MRN: [id number]  Principal Diagnosis: Bipolar 1 disorder, depressed ([place])  Secondary Diagnoses: Principal Problem:   Bipolar 1 disorder, depressed ([place]) Active Problems:   Borderline personality disorder   Current Medications:  Current Facility-Administered Medications  Medication Dose Route Frequency Provider Last Rate Last Dose  . acetaminophen (TYLENOL) tablet 650 mg  650 mg Oral Q6H PRN Ethelene Hal, NP   650 mg at 02/11/16 6438  . alum & mag hydroxide-simeth (MAALOX/MYLANTA) 200-200-20 MG/5ML suspension 30 mL  30 mL Oral Q4H PRN Ethelene Hal, NP      . dicyclomine (BENTYL) tablet 20 mg  20 mg Oral BID Ethelene Hal, NP   20 mg at 02/13/16 0813  . divalproex (DEPAKOTE ER) 24 hr tablet 1,000 mg  1,000 mg Oral QHS Ethelene Hal, NP   1,000 mg at 02/12/16 2208  . hydrOXYzine (ATARAX/VISTARIL) tablet 25 mg  25 mg Oral Q6H PRN Ethelene Hal, NP   25 mg at 02/12/16 2208  . ibuprofen (ADVIL,MOTRIN) tablet 400 mg  400 mg Oral Q6H PRN Rozetta Nunnery, NP   400 mg at 02/13/16 1004  . magnesium hydroxide (MILK OF MAGNESIA) suspension 30 mL  30 mL Oral Daily PRN Ethelene Hal, NP      . nicotine polacrilex (NICORETTE) gum 2 mg  2 mg Oral PRN Jenne Campus, MD   2 mg at 02/12/16 0818  . QUEtiapine (SEROQUEL) tablet 300 mg  300 mg Oral QHS Ethelene Hal, NP   300 mg at 02/12/16 2208  . sertraline (ZOLOFT) tablet 150 mg  150 mg Oral Daily Ethelene Hal, NP   150 mg at 02/13/16 0813  . traZODone (DESYREL) tablet 50 mg  50 mg Oral QHS PRN Ethelene Hal, NP   50 mg at 02/12/16 2208   PTA Medications: Prescriptions Prior to Admission  Medication Sig Dispense Refill Last Dose  . divalproex (DEPAKOTE ER) 500 MG 24 hr tablet Take 2 tablets (1,000 mg total) by mouth at bedtime. 60 tablet 0 03/07/2015 at Unknown time  .  Norgestimate-Ethinyl Estradiol Triphasic 0.18/0.215/0.25 MG-35 MCG tablet Take 1 tablet by mouth daily.    03/07/2015 at Unknown time  . QUEtiapine (SEROQUEL) 300 MG tablet Take 1 tablet (300 mg total) by mouth at bedtime. 30 tablet 0 03/07/2015 at Unknown time  . sertraline (ZOLOFT) 50 MG tablet Take 3 tablets (150 mg total) by mouth daily. 30 tablet 0 03/08/2015 at Unknown time    Patient Stressors: Loss of fiance (was killed 2 weeks ago while in the armed forces-[employer]) Occupational concerns Traumatic event  Patient Strengths: Curator fund of knowledge Physical Health  Treatment Modalities: Medication Management, Group therapy, Case management,  1 to 1 session with clinician, Psychoeducation, Recreational therapy.   Physician Treatment Plan for Primary Diagnosis: Bipolar 1 disorder, depressed ([place]) Long Term Goal(s):     Short Term Goals:    Medication Management: Evaluate patient's response, side effects, and tolerance of medication regimen.  Therapeutic Interventions: 1 to 1 sessions, Unit Group sessions and Medication administration.  Evaluation of Outcomes: Met  Physician Treatment Plan for Secondary Diagnosis: Principal Problem:   Bipolar 1 disorder, depressed ([place]) Active Problems:   Borderline personality disorder  Long Term Goal(s):     Short Term Goals:       Medication Management: Evaluate patient's response, side effects, and tolerance of medication regimen.  Therapeutic Interventions: 1 to 1 sessions, Unit Group sessions and Medication administration.  Evaluation of Outcomes: Met   RN Treatment Plan for Primary Diagnosis: Bipolar 1 disorder, depressed ([place]) Long Term Goal(s): Knowledge of disease and therapeutic regimen to maintain health will improve  Short Term Goals: Ability to remain free from injury will improve, Ability to disclose and discuss suicidal ideas and Ability to identify and develop effective coping behaviors will  improve  Medication Management: RN will administer medications as ordered by provider, will assess and evaluate patient's response and provide education to patient for prescribed medication. RN will report any adverse and/or side effects to prescribing provider.  Therapeutic Interventions: 1 on 1 counseling sessions, Psychoeducation, Medication administration, Evaluate responses to treatment, Monitor vital signs and CBGs as ordered, Perform/monitor CIWA, COWS, AIMS and Fall Risk screenings as ordered, Perform wound care treatments as ordered.  Evaluation of Outcomes: Met   LCSW Treatment Plan for Primary Diagnosis: Bipolar 1 disorder, depressed ([place]) Long Term Goal(s): Safe transition to appropriate next level of care at discharge, Engage patient in therapeutic group addressing interpersonal concerns.  Short Term Goals: Engage patient in aftercare planning with referrals and resources, Facilitate patient progression through stages of change regarding substance use diagnoses and concerns and Identify triggers associated with mental health/substance abuse issues  Therapeutic Interventions: Assess for all discharge needs, 1 to 1 time with Social worker, Explore available resources and support systems, Assess for adequacy in community support network, Educate family and significant other(s) on suicide prevention, Complete Psychosocial Assessment, Interpersonal group therapy.  Evaluation of Outcomes: Met   Progress in Treatment: Attending groups: Yes. Participating in groups: Yes. Taking medication as prescribed: Yes. Toleration medication: Yes. Family/Significant other contact made: Yes, individual(s) contacted:  friend Patient understands diagnosis: Yes. Discussing patient identified problems/goals with staff: Yes. Medical problems stabilized or resolved: Yes. Denies suicidal/homicidal ideation: Yes. SELF REPORT Issues/concerns per patient self-inventory: No. Other: n/a  New problem(s)  identified: Yes, Describe:  pt anxious to discharge [place] new job Monday night.  New Short Term/Long Term Goal(s): detox, mood stabilization; development of comprehensive mental wellness/sobriety plan.   Discharge Plan or Barriers: Pt plans to return home; resume services with [facility] providers. appts have been scheduled by CSW. Pt will be picked up at 11am today.   Reason for Continuation of Hospitalization: none  Estimated Length of Stay: d/c today   Attendees: Patient: 02/13/2016 10:16 AM  Physician: Dr. Charlyne Mom MD 02/13/2016 10:16 AM  Nursing: Minta Balsam RN 02/13/2016 10:16 AM  RN Care Manager: Lars Pinks CM 02/13/2016 10:16 AM  Social Worker: Nira Conn Smart, LCSW; Adriana Reams LCSW 02/13/2016 10:16 AM  Recreational Therapist: Rhunette Croft 02/13/2016 10:16 AM  Other: Ricky Ala NP; May Augustin NP 02/13/2016 10:16 AM  Other:  02/13/2016 10:16 AM  Other: 02/13/2016 10:16 AM    Scribe for Treatment Team: [place], LCSW 02/13/2016 10:16 AM

## 2016-02-13 NOTE — Progress Notes (Signed)
  [facility] :  Will you be returning to the same living situation after discharge:  Yes,  home At discharge, do you have transportation home?: Yes,  friend--discharging at 11am.  Do you have the ability to pay for your medications: Yes,  [facility]  Release of information consent forms completed and submitted to medical records by CSW.  Patient to Follow up at: Follow-up Information    [facility] Follow up on 02/14/2016.   Specialty:  Behavioral Health Why:  Appt with Junie BamePaula Katz on 02/14/15 (Tuesday) at 10:00AM for hospital follow-up/therapy. Your appt with Tamela OddiJo Hughes for 2/5 has been rescheduled for 2/12 (Monday) at 12:20PM. Thank you.   Contact information: [address] [place] [address] [phone]           Next level of care provider has [facility] Link:no  Safety Planning and Suicide Prevention discussed: Yes,  SPE completed with pt's friend and contact attempts made with pt's mother. SPI pamphlet and Mobile Crisis informatino provided.  Have you used any form of tobacco in the last 30 days? (Cigarettes, Smokeless Tobacco, Cigars, and/or Pipes): Yes  Has patient been referred to the Quitline?: Patient refused referral  Patient has been referred for addiction treatment: Yes  Neko Mcgeehan N Smart LCSW 02/13/2016, 10:14 AM
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
## 2016-02-13 NOTE — Progress Notes (Signed)
Pt discharged to lobby. Pt was stable and appreciative at that time. All papers and prescriptions were given and valuables returned. Verbal understanding expressed. Denies SI/HI and A/VH. Pt given opportunity to express concerns and ask questions.  

## 2016-03-06 ENCOUNTER — Encounter (HOSPITAL_COMMUNITY): Payer: Self-pay | Admitting: Emergency Medicine

## 2016-03-06 DIAGNOSIS — M25561 Pain in right knee: Secondary | ICD-10-CM | POA: Diagnosis not present

## 2016-03-06 DIAGNOSIS — F17210 Nicotine dependence, cigarettes, uncomplicated: Secondary | ICD-10-CM | POA: Insufficient documentation

## 2016-03-06 DIAGNOSIS — W1839XA Other fall on same level, initial encounter: Secondary | ICD-10-CM | POA: Insufficient documentation

## 2016-03-06 DIAGNOSIS — Y999 Unspecified external cause status: Secondary | ICD-10-CM | POA: Diagnosis not present

## 2016-03-06 DIAGNOSIS — F909 Attention-deficit hyperactivity disorder, unspecified type: Secondary | ICD-10-CM | POA: Insufficient documentation

## 2016-03-06 DIAGNOSIS — Y9389 Activity, other specified: Secondary | ICD-10-CM | POA: Diagnosis not present

## 2016-03-06 DIAGNOSIS — Z5321 Procedure and treatment not carried out due to patient leaving prior to being seen by health care provider: Secondary | ICD-10-CM | POA: Insufficient documentation

## 2016-03-06 DIAGNOSIS — Y9289 Other specified places as the place of occurrence of the external cause: Secondary | ICD-10-CM | POA: Diagnosis not present

## 2016-03-06 NOTE — ED Triage Notes (Signed)
Pt states she fell on woods a week ago, was having bruises on her right leg, but now she continues to  Have pain 10/10 and is hard to for her to walk. Pt works standing and is very painful.

## 2016-03-07 ENCOUNTER — Emergency Department (HOSPITAL_COMMUNITY): Payer: Medicaid Other

## 2016-03-07 ENCOUNTER — Emergency Department (HOSPITAL_COMMUNITY)
Admission: EM | Admit: 2016-03-07 | Discharge: 2016-03-07 | Disposition: A | Discharge status: Home / Self Care | Payer: Medicaid Other | Attending: Emergency Medicine | Admitting: Emergency Medicine

## 2016-03-07 NOTE — ED Notes (Signed)
Called for room X4 with no answer. Pt not in radiology either. Nurse 1st aware.

## 2016-04-13 ENCOUNTER — Emergency Department (HOSPITAL_COMMUNITY)
Admission: EM | Admit: 2016-04-13 | Discharge: 2016-04-13 | Disposition: A | Discharge status: Home / Self Care | Payer: Medicaid Other | Attending: Emergency Medicine | Admitting: Emergency Medicine

## 2016-04-13 ENCOUNTER — Encounter (HOSPITAL_COMMUNITY): Payer: Self-pay

## 2016-04-13 DIAGNOSIS — F909 Attention-deficit hyperactivity disorder, unspecified type: Secondary | ICD-10-CM | POA: Insufficient documentation

## 2016-04-13 DIAGNOSIS — F17210 Nicotine dependence, cigarettes, uncomplicated: Secondary | ICD-10-CM | POA: Insufficient documentation

## 2016-04-13 DIAGNOSIS — K529 Noninfective gastroenteritis and colitis, unspecified: Secondary | ICD-10-CM | POA: Diagnosis not present

## 2016-04-13 DIAGNOSIS — R1084 Generalized abdominal pain: Secondary | ICD-10-CM | POA: Diagnosis present

## 2016-04-13 LAB — CBC
HEMATOCRIT: 39.2 % (ref 36.0–46.0)
Hemoglobin: 13 g/dL (ref 12.0–15.0)
MCH: 28.5 pg (ref 26.0–34.0)
MCHC: 33.2 g/dL (ref 30.0–36.0)
MCV: 86 fL (ref 78.0–100.0)
Platelets: 377 10*3/uL (ref 150–400)
RBC: 4.56 MIL/uL (ref 3.87–5.11)
RDW: 12.5 % (ref 11.5–15.5)
WBC: 10.1 10*3/uL (ref 4.0–10.5)

## 2016-04-13 LAB — COMPREHENSIVE METABOLIC PANEL
ALT: 21 U/L (ref 14–54)
AST: 22 U/L (ref 15–41)
Albumin: 3.6 g/dL (ref 3.5–5.0)
Alkaline Phosphatase: 47 U/L (ref 38–126)
Anion gap: 6 (ref 5–15)
BILIRUBIN TOTAL: 0.6 mg/dL (ref 0.3–1.2)
BUN: 7 mg/dL (ref 6–20)
CO2: 26 mmol/L (ref 22–32)
Calcium: 9.6 mg/dL (ref 8.9–10.3)
Chloride: 106 mmol/L (ref 101–111)
Creatinine, Ser: 0.61 mg/dL (ref 0.44–1.00)
GFR calc Af Amer: >60 mL/min (ref >60)
GFR calc non Af Amer: >60 mL/min (ref >60)
Glucose, Bld: 84 mg/dL (ref 65–99)
POTASSIUM: 4 mmol/L (ref 3.5–5.1)
Sodium: 138 mmol/L (ref 135–145)
TOTAL PROTEIN: 7 g/dL (ref 6.5–8.1)

## 2016-04-13 LAB — URINALYSIS, ROUTINE W REFLEX MICROSCOPIC
BILIRUBIN URINE: NEGATIVE
Bacteria, UA: NONE SEEN
GLUCOSE, UA: NEGATIVE mg/dL
KETONES UR: NEGATIVE mg/dL
LEUKOCYTES UA: NEGATIVE
NITRITE: NEGATIVE
PH: 7 (ref 5.0–8.0)
Protein, ur: NEGATIVE mg/dL
SPECIFIC GRAVITY, URINE: 1.023 (ref 1.005–1.030)

## 2016-04-13 LAB — POC URINE PREG, ED: Preg Test, Ur: NEGATIVE

## 2016-04-13 LAB — LIPASE, BLOOD: Lipase: 25 U/L (ref 11–51)

## 2016-04-13 MED ORDER — SODIUM CHLORIDE 0.9 % IV BOLUS (SEPSIS)
1000.0000 mL | Freq: Once | INTRAVENOUS | Status: AC
  Administered 2016-04-13: 1000 mL via INTRAVENOUS

## 2016-04-13 MED ORDER — ONDANSETRON HCL 4 MG/2ML IJ SOLN
4.0000 mg | Freq: Once | INTRAMUSCULAR | Status: AC
  Administered 2016-04-13: 4 mg via INTRAVENOUS
  Filled 2016-04-13: qty 2

## 2016-04-13 NOTE — ED Triage Notes (Signed)
Pt complaining of N/V x 1 day. Pt complaining of mid abdominal pain x 5. Pt denies any diarrhea. Pt denies any urinary symptoms or vaginal bleeding/discharge.

## 2016-04-13 NOTE — ED Provider Notes (Signed)
[facility]-EMERGENCY DEPT Provider Note   CSN: [id number] Arrival date & time: 04/13/16  1953     History   Chief Complaint Chief Complaint  Patient presents with  . Abdominal Pain  . Emesis    HPI Jordan Hanson is a 21 y.o. female.  The history is provided by the patient and medical records.  Abdominal Pain   This is a new problem. The current episode started more than 1 week ago. The problem occurs constantly. The problem has not changed since onset.Associated with: No obvious trigger. The pain is located in the generalized abdominal region. The quality of the pain is dull. The pain is mild. Associated symptoms include anorexia, diarrhea, nausea and vomiting. Pertinent negatives include fever, dysuria, hematuria, headaches, arthralgias and myalgias. Nothing aggravates the symptoms. Nothing relieves the symptoms.  Emesis   Associated symptoms include abdominal pain and diarrhea. Pertinent negatives include no arthralgias, no chills, no cough, no fever, no headaches and no myalgias.  GI Problem  Associated symptoms include abdominal pain. Pertinent negatives include no chest pain, no headaches and no shortness of breath.    Past Medical History:  Diagnosis Date  . ADHD (attention deficit hyperactivity disorder)   . Bipolar disorder (HCC)   . Depression     Patient Active Problem List   Diagnosis Date Noted  . Borderline personality disorder 02/13/2016  . Bipolar 1 disorder, depressed (HCC) 02/10/2016  . Suicidal ideation 07/19/2014  . 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 MDD (major depressive disorder) 06/17/2014    Past Surgical History:  Procedure Laterality Date  . TONSILLECTOMY      OB History    No data available       Home Medications    Prior to Admission medications   Medication Sig Start Date End Date Taking? Authorizing Provider  dicyclomine (BENTYL) 20 MG tablet Take 1 tablet (20 mg total) by mouth 2  (two) times daily. 02/13/16  Yes Adonis Brook, NP  divalproex (DEPAKOTE ER) 500 MG 24 hr tablet Take 2 tablets (1,000 mg total) by mouth at bedtime. 02/13/16  Yes Adonis Brook, NP  hydrOXYzine (ATARAX/VISTARIL) 25 MG tablet Take 1 tablet (25 mg total) by mouth every 6 (six) hours as needed for anxiety. 02/13/16  Yes Adonis Brook, NP  nicotine polacrilex (NICORETTE) 2 MG gum Take 1 each (2 mg total) by mouth as needed for smoking cessation. 02/13/16  Yes Adonis Brook, NP  QUEtiapine (SEROQUEL) 300 MG tablet Take 1 tablet (300 mg total) by mouth at bedtime. 02/13/16  Yes Adonis Brook, NP  sertraline (ZOLOFT) 50 MG tablet Take 3 tablets (150 mg total) by mouth daily. 02/14/16  Yes Adonis Brook, NP  traZODone (DESYREL) 50 MG tablet Take 1 tablet (50 mg total) by mouth at bedtime as needed for sleep. 02/13/16  Yes Adonis Brook, NP    Family History History reviewed. No pertinent family history.  Social History Social History  Substance Use Topics  . Smoking status: Current Every Day Smoker    Packs/day: 1.00    Types: Cigarettes  . Smokeless tobacco: Never Used  . Alcohol use No     Comment: occ     Allergies   Geodon [ziprasidone hcl] and Penicillins   Review of Systems Review of Systems  Constitutional: Negative for chills and fever.  HENT: Negative for ear pain and sore throat.   Eyes: Negative for pain and visual disturbance.  Respiratory: Negative for cough and shortness of breath.  Cardiovascular: Negative for chest pain and palpitations.  Gastrointestinal: Positive for abdominal pain, anorexia, diarrhea, nausea and vomiting.  Genitourinary: Negative for dysuria and hematuria.  Musculoskeletal: Negative for arthralgias, back pain and myalgias.  Skin: Negative for color change and rash.  Neurological: Negative for seizures, syncope and headaches.  All other systems reviewed and are negative.    Physical Exam Updated Vital Signs BP 112/68   Pulse 84   Temp 98 F (36.7  C) (Oral)   Resp 16   LMP 04/13/2016 (Exact Date)   SpO2 96%   Physical Exam  Constitutional: She is oriented to person, place, and time. She appears well-developed and well-nourished. No distress.  HENT:  Head: Normocephalic and atraumatic.  Eyes: Conjunctivae are normal.  Neck: Neck supple.  Cardiovascular: Normal rate and regular rhythm.   No murmur heard. Pulmonary/Chest: Effort normal and breath sounds normal. No respiratory distress.  Abdominal: Soft. There is no tenderness. There is no guarding.  Musculoskeletal: She exhibits no edema.  Neurological: She is alert and oriented to person, place, and time.  Skin: Skin is warm and dry.  Psychiatric: She has a normal mood and affect.  Nursing note and vitals reviewed.    ED Treatments / Results  Labs (all labs ordered are listed, but only abnormal results are displayed) Labs Reviewed  URINALYSIS, ROUTINE W REFLEX MICROSCOPIC - Abnormal; Notable for the following:       Result Value   Hgb urine dipstick MODERATE (*)    Squamous Epithelial / LPF 0-5 (*)    All other components within normal limits  LIPASE, BLOOD  COMPREHENSIVE METABOLIC PANEL  CBC  POC URINE PREG, ED    EKG  EKG Interpretation None       Radiology No results found.  Procedures Procedures (including critical care time)  Medications Ordered in ED Medications  sodium chloride 0.9 % bolus 1,000 mL (1,000 mLs Intravenous New Bag/Given 04/13/16 2142)  ondansetron (ZOFRAN) injection 4 mg (4 mg Intravenous Given 04/13/16 2145)     Initial Impression / Assessment and Plan / ED Course  I have reviewed the triage vital signs and the nursing notes.  Pertinent labs & imaging results that were available during my care of the patient were reviewed by me and considered in my medical decision making (see chart for details).     Pt presents with abdominal pain, N/V, and loose stools. Says she had URI symptoms two weeks ago that resolved w/time & OTC  medications; she was doing well for a few days & then she started to develop the above listed symptoms which she has now had for approximately 1wk. She also endorses a generalized malaise and only came in today b/c she felt like she was unable to stay at work today b/c she "felt like crap." Denies F/C, HA, lightheadedness, cough, CP, SOB, urinary symptoms  VS & exam as above. NS bolus & Zofran given. Labs unremarkable. UA w/o signs of infection. UPT negative. Able to tolerate PO in the ED. Pt likely suffering from a viral gastroenteritis.  Explained all results to the Pt. Will d/c home w/prescription for Zofran. Recommending f/u with PCP. ED return precautions provided. Pt acknowledged understanding of and concurrence with the plan. All questions answered to her satisfaction. In stable condition at the time of discharge.  Final Clinical Impressions(s) / ED Diagnoses   Final diagnoses:  Gastroenteritis    New Prescriptions New Prescriptions   No medications on file     [place],  MD 04/13/16 2300    Nira Conn, MD 04/14/16 [phone]

## 2016-04-13 NOTE — ED Notes (Signed)
Pt is eating a muffin and drinking a gatorade.  Asked that pt refrain from eating until labs are back.

## 2016-06-09 ENCOUNTER — Encounter (HOSPITAL_COMMUNITY): Payer: Self-pay

## 2016-06-09 ENCOUNTER — Emergency Department (HOSPITAL_COMMUNITY)
Admission: EM | Admit: 2016-06-09 | Discharge: 2016-06-10 | Discharge status: Against Medical Advice | Payer: Medicaid Other | Attending: Emergency Medicine | Admitting: Emergency Medicine

## 2016-06-09 DIAGNOSIS — N938 Other specified abnormal uterine and vaginal bleeding: Secondary | ICD-10-CM | POA: Insufficient documentation

## 2016-06-09 DIAGNOSIS — Z5321 Procedure and treatment not carried out due to patient leaving prior to being seen by health care provider: Secondary | ICD-10-CM | POA: Diagnosis not present

## 2016-06-09 NOTE — ED Triage Notes (Signed)
Onset 3 weeks pt started period 2 weeks early.  Has been bleeding heavier than usual since.  Now c/o  Back pain.  abd cramping is usual period cramping.

## 2016-06-28 ENCOUNTER — Encounter (HOSPITAL_COMMUNITY): Payer: Self-pay | Admitting: *Deleted

## 2016-06-28 ENCOUNTER — Emergency Department (HOSPITAL_COMMUNITY)
Admission: EM | Admit: 2016-06-28 | Discharge: 2016-06-28 | Disposition: A | Discharge status: Home / Self Care | Payer: Medicaid Other | Attending: Emergency Medicine | Admitting: Emergency Medicine

## 2016-06-28 DIAGNOSIS — F319 Bipolar disorder, unspecified: Secondary | ICD-10-CM | POA: Insufficient documentation

## 2016-06-28 DIAGNOSIS — F17210 Nicotine dependence, cigarettes, uncomplicated: Secondary | ICD-10-CM | POA: Diagnosis not present

## 2016-06-28 DIAGNOSIS — F909 Attention-deficit hyperactivity disorder, unspecified type: Secondary | ICD-10-CM | POA: Diagnosis not present

## 2016-06-28 DIAGNOSIS — Z79899 Other long term (current) drug therapy: Secondary | ICD-10-CM | POA: Insufficient documentation

## 2016-06-28 DIAGNOSIS — R454 Irritability and anger: Secondary | ICD-10-CM

## 2016-06-28 NOTE — ED Triage Notes (Signed)
Patient is alert and oriented x4.  She is being seen for medical clearance due to a verbal outburst with her house mates and they called [employer] to have her checked out.  Currently she is calm and cooperative with no pain.

## 2016-06-28 NOTE — ED Provider Notes (Signed)
[facility]-EMERGENCY DEPT Provider Note   CSN: [id number] Arrival date & time: 06/28/16  4098     History   Chief Complaint Chief Complaint  Patient presents with  . Medical Clearance    HPI Jordan Hanson is a 21 y.o. female.  The history is provided by the patient.  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 She had lot of anger and frustration buildup within her, and she felt like she needed someone to talk to. The person she normally talks to his not available having committed suicide 2 months ago. She states that she just got angry and had an outburst consisting of screaming and yelling. She feels better now. She denies homicidal or suicidal ideation. She denies depression. She does have history of depression in the past, but has been compliant with her medications and has not had any depression recently. She denies crying spells, early morning wakening, and anhedonia. She denies hallucinations. She denies alcohol and drug use. She is requesting outpatient counseling resources.   Past Medical History:  Diagnosis Date  . ADHD (attention deficit hyperactivity disorder)   . Bipolar disorder (HCC)   . Depression     Patient Active Problem List   Diagnosis Date Noted  . Borderline personality disorder 02/13/2016  . Bipolar 1 disorder, depressed (HCC) 02/10/2016  . Suicidal ideation 07/19/2014  . 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 MDD (major depressive disorder) 06/17/2014    Past Surgical History:  Procedure Laterality Date  . TONSILLECTOMY      OB History    No data available       Home Medications    Prior to Admission medications   Medication Sig Start Date End Date Taking? Authorizing Provider  dicyclomine (BENTYL) 20 MG tablet Take 1 tablet (20 mg total) by mouth 2 (two) times daily. 02/13/16   Adonis Brook, NP  divalproex (DEPAKOTE ER) 500 MG 24 hr tablet Take 2 tablets  (1,000 mg total) by mouth at bedtime. 02/13/16   Adonis Brook, NP  hydrOXYzine (ATARAX/VISTARIL) 25 MG tablet Take 1 tablet (25 mg total) by mouth every 6 (six) hours as needed for anxiety. 02/13/16   Adonis Brook, NP  nicotine polacrilex (NICORETTE) 2 MG gum Take 1 each (2 mg total) by mouth as needed for smoking cessation. 02/13/16   Adonis Brook, NP  QUEtiapine (SEROQUEL) 300 MG tablet Take 1 tablet (300 mg total) by mouth at bedtime. 02/13/16   Adonis Brook, NP  sertraline (ZOLOFT) 50 MG tablet Take 3 tablets (150 mg total) by mouth daily. 02/14/16   Adonis Brook, NP  traZODone (DESYREL) 50 MG tablet Take 1 tablet (50 mg total) by mouth at bedtime as needed for sleep. 02/13/16   Adonis Brook, NP    Family History History reviewed. No pertinent family history.  Social History Social History  Substance Use Topics  . Smoking status: Current Every Day Smoker    Packs/day: 1.00    Types: Cigarettes  . Smokeless tobacco: Never Used  . Alcohol use No     Comment: occ     Allergies   Geodon [ziprasidone hcl] and Penicillins   Review of Systems Review of Systems  All other systems reviewed and are negative.    Physical Exam Updated Vital Signs BP (!) 142/89 (BP Location: Right Arm)   Pulse (!) 110   Temp 98.6 F (37 C) (Oral)   Resp 16  Ht 5\' 4"  (1.626 m)   Wt 88.9 kg (196 lb)   LMP 06/14/2016   SpO2 96%   BMI 33.64 kg/m   Physical Exam  Nursing note and vitals reviewed.  21 year old female, resting comfortably and in no acute distress. Vital signs are significant for borderline hypertension, and mild tachycardia. Oxygen saturation is 96%, which is normal. Head is normocephalic and atraumatic. PERRLA, EOMI. Oropharynx is clear. Neck is nontender and supple without adenopathy or JVD. Back is nontender and there is no CVA tenderness. Lungs are clear without rales, wheezes, or rhonchi. Chest is nontender. Heart has regular rate and rhythm without  murmur. Abdomen is soft, flat, nontender without masses or hepatosplenomegaly and peristalsis is normoactive. Extremities have no cyanosis or edema, full range of motion is present. Skin is warm and dry without rash. Neurologic: Mental status is normal, cranial nerves are intact, there are no motor or sensory deficits.  ED Treatments / Results   Procedures Procedures (including critical care time)  Medications Ordered in ED Medications - No data to display   Initial Impression / Assessment and Plan / ED Course  I have reviewed the triage vital signs and the nursing notes.  Episode of anger and frustration which has resolved. Old records are reviewed, and she does have prior hospitalizations for depression. No evidence of depression currently and no suicidal or homicidal ideation. No indication for laboratory testing. She is discharged with outpatient counseling resources.  Final Clinical Impressions(s) / ED Diagnoses   Final diagnoses:  Outbursts of anger    New Prescriptions New Prescriptions   No medications on file     Dione BoozeGlick, Jeorgia Helming, MD 06/28/16 [phone]

## 2016-09-27 ENCOUNTER — Emergency Department (HOSPITAL_COMMUNITY): Payer: Medicaid Other

## 2016-09-27 ENCOUNTER — Emergency Department (HOSPITAL_COMMUNITY)
Admission: EM | Admit: 2016-09-27 | Discharge: 2016-09-27 | Disposition: A | Discharge status: Home / Self Care | Payer: Medicaid Other | Attending: Emergency Medicine | Admitting: Emergency Medicine

## 2016-09-27 ENCOUNTER — Encounter (HOSPITAL_COMMUNITY): Payer: Self-pay

## 2016-09-27 DIAGNOSIS — F17210 Nicotine dependence, cigarettes, uncomplicated: Secondary | ICD-10-CM | POA: Diagnosis not present

## 2016-09-27 DIAGNOSIS — Z79899 Other long term (current) drug therapy: Secondary | ICD-10-CM | POA: Diagnosis not present

## 2016-09-27 DIAGNOSIS — R0789 Other chest pain: Secondary | ICD-10-CM | POA: Insufficient documentation

## 2016-09-27 DIAGNOSIS — F909 Attention-deficit hyperactivity disorder, unspecified type: Secondary | ICD-10-CM | POA: Diagnosis not present

## 2016-09-27 DIAGNOSIS — R079 Chest pain, unspecified: Secondary | ICD-10-CM | POA: Diagnosis present

## 2016-09-27 LAB — CBC
HEMATOCRIT: 40.1 % (ref 36.0–46.0)
Hemoglobin: 13 g/dL (ref 12.0–15.0)
MCH: 28 pg (ref 26.0–34.0)
MCHC: 32.4 g/dL (ref 30.0–36.0)
MCV: 86.2 fL (ref 78.0–100.0)
Platelets: 310 10*3/uL (ref 150–400)
RBC: 4.65 MIL/uL (ref 3.87–5.11)
RDW: 12.7 % (ref 11.5–15.5)
WBC: 6.6 10*3/uL (ref 4.0–10.5)

## 2016-09-27 LAB — I-STAT TROPONIN, ED
Troponin i, poc: 0.01 ng/mL (ref 0.00–0.08)
Troponin i, poc: 0.01 ng/mL (ref 0.00–0.08)

## 2016-09-27 LAB — BASIC METABOLIC PANEL
Anion gap: 11 (ref 5–15)
BUN: 11 mg/dL (ref 6–20)
CHLORIDE: 110 mmol/L (ref 101–111)
CO2: 18 mmol/L — ABNORMAL LOW (ref 22–32)
CREATININE: 0.7 mg/dL (ref 0.44–1.00)
Calcium: 9.7 mg/dL (ref 8.9–10.3)
GFR calc Af Amer: >60 mL/min (ref >60)
GFR calc non Af Amer: >60 mL/min (ref >60)
Glucose, Bld: 106 mg/dL — ABNORMAL HIGH (ref 65–99)
POTASSIUM: 3.3 mmol/L — AB (ref 3.5–5.1)
SODIUM: 139 mmol/L (ref 135–145)

## 2016-09-27 LAB — I-STAT BETA HCG BLOOD, ED (MC, WL, AP ONLY)

## 2016-09-27 LAB — D-DIMER, QUANTITATIVE (NOT AT ARMC): D DIMER QUANT: 0.29 ug{FEU}/mL (ref 0.00–0.50)

## 2016-09-27 MED ORDER — LORAZEPAM 2 MG/ML IJ SOLN
0.5000 mg | Freq: Once | INTRAMUSCULAR | Status: AC
  Administered 2016-09-27: 0.5 mg via INTRAVENOUS
  Filled 2016-09-27: qty 1

## 2016-09-27 MED ORDER — SODIUM CHLORIDE 0.9 % IV BOLUS (SEPSIS): 1000.0000 mL | Freq: Once | INTRAVENOUS | Status: DC

## 2016-09-27 MED ORDER — CYCLOBENZAPRINE HCL 10 MG PO TABS: 10.0000 mg | ORAL_TABLET | Freq: Three times a day (TID) | ORAL | 0 refills | Status: DC | PRN

## 2016-09-27 MED ORDER — SODIUM CHLORIDE 0.9 % IV BOLUS (SEPSIS)
1000.0000 mL | Freq: Once | INTRAVENOUS | Status: AC
  Administered 2016-09-27: 1000 mL via INTRAVENOUS

## 2016-09-27 MED ORDER — POTASSIUM CHLORIDE CRYS ER 20 MEQ PO TBCR
40.0000 meq | EXTENDED_RELEASE_TABLET | Freq: Once | ORAL | Status: AC
  Administered 2016-09-27: 40 meq via ORAL
  Filled 2016-09-27: qty 2

## 2016-09-27 MED ORDER — NAPROXEN 375 MG PO TABS: 375.0000 mg | ORAL_TABLET | Freq: Two times a day (BID) | ORAL | 0 refills | Status: DC

## 2016-09-27 MED ORDER — KETOROLAC TROMETHAMINE 30 MG/ML IJ SOLN
15.0000 mg | Freq: Once | INTRAMUSCULAR | Status: AC
  Administered 2016-09-27: 15 mg via INTRAVENOUS
  Filled 2016-09-27: qty 1

## 2016-09-27 NOTE — ED Triage Notes (Signed)
Pt from home with sudden on set chest pain that is tight and sharp. Pt has some shortness of breath with it. Pt A&O x4 with pain at 6-10

## 2016-09-27 NOTE — Discharge Instructions (Signed)
Your imaging and lab work has been reassuring. Unknown etiology of chest pain. Have prescribed use of muscle relaxers and anti-inflammatories.   Please take the Naproxen as prescribed for pain. Do not take any additional NSAIDs including Motrin, Aleve, Ibuprofen, Advil.  Please the the flexeril for muscle relaxation. This medication will make you drowsy so avoid situation that could place you in danger.   It is important for you to quit smoking. Would follow up with primary care doctor. If you continue to develop chest pain or shortness of breath of the next 1-2 days and is not resolving make sure you return to the ED immediately. If she develops any new symptoms return sooner.

## 2016-09-27 NOTE — ED Provider Notes (Signed)
[facility]-EMERGENCY DEPT Provider Note   CSN: [id number] Arrival date & time: 09/27/16  1840     History   Chief Complaint Chief Complaint  Patient presents with  . Chest Pain    HPI Jordan Hanson is a 21 y.o. female.  HPI 21 year old Caucasian female past medical history significant for bipolar disorder and depression presents to the emergency department today with complaints of pleuritic chest pain and shortness of breath. Patient states that she was sitting at home approximately one and half hours ago when she developed acute sudden onset substernal chest pain that did not radiate. Patient states that the pain was worse with deep breathing. States that she called 911 to bring her to the ED for evaluation. She tried taking a cold shower that did not help her symptoms. Nothing makes better. She has not taken any of her symptoms prior to arrival. Was not associated with diaphoresis, nausea, emesis. The patient states that she is on oral birth control. Denies any recent hospitalizations/surgeries, prolonged immobilization, tobacco use, history of DVT/PE, unilateral leg swelling or calf tenderness. Patient denies any cardiac history. Denies any history of diabetes, hypertension, hyperlipidemia.  Pt denies any fever, chill, ha, vision changes, lightheadedness, dizziness, congestion, neck pain, cough, abd pain, n/v/d, urinary symptoms, change in bowel habits, melena, hematochezia, lower extremity paresthesias.  Past Medical History:  Diagnosis Date  . ADHD (attention deficit hyperactivity disorder)   . Bipolar disorder (HCC)   . Depression     Patient Active Problem List   Diagnosis Date Noted  . Borderline personality disorder 02/13/2016  . Bipolar 1 disorder, depressed (HCC) 02/10/2016  . Suicidal ideation 07/19/2014  . 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 MDD (major depressive disorder) 06/17/2014    Past Surgical History:    Procedure Laterality Date  . TONSILLECTOMY      OB History    No data available       Home Medications    Prior to Admission medications   Medication Sig Start Date End Date Taking? Authorizing Provider  dicyclomine (BENTYL) 20 MG tablet Take 1 tablet (20 mg total) by mouth 2 (two) times daily. 02/13/16   Adonis Brook, NP  divalproex (DEPAKOTE ER) 500 MG 24 hr tablet Take 2 tablets (1,000 mg total) by mouth at bedtime. 02/13/16   Adonis Brook, NP  hydrOXYzine (ATARAX/VISTARIL) 25 MG tablet Take 1 tablet (25 mg total) by mouth every 6 (six) hours as needed for anxiety. 02/13/16   Adonis Brook, NP  nicotine polacrilex (NICORETTE) 2 MG gum Take 1 each (2 mg total) by mouth as needed for smoking cessation. 02/13/16   Adonis Brook, NP  QUEtiapine (SEROQUEL) 300 MG tablet Take 1 tablet (300 mg total) by mouth at bedtime. 02/13/16   Adonis Brook, NP  sertraline (ZOLOFT) 50 MG tablet Take 3 tablets (150 mg total) by mouth daily. 02/14/16   Adonis Brook, NP  traZODone (DESYREL) 50 MG tablet Take 1 tablet (50 mg total) by mouth at bedtime as needed for sleep. 02/13/16   Adonis Brook, NP    Family History History reviewed. No pertinent family history.  Social History Social History  Substance Use Topics  . Smoking status: Current Every Day Smoker    Packs/day: 1.00    Types: Cigarettes  . Smokeless tobacco: Never Used  . Alcohol use No     Comment: occ     Allergies   Geodon [ziprasidone hcl] and Penicillins   Review of Systems  Review of Systems  Constitutional: Negative for chills and fever.  HENT: Negative for congestion.   Eyes: Negative for visual disturbance.  Respiratory: Positive for shortness of breath. Negative for cough and wheezing.   Cardiovascular: Positive for chest pain and palpitations. Negative for leg swelling.  Gastrointestinal: Negative for abdominal pain, diarrhea, nausea and vomiting.  Genitourinary: Negative for dysuria, flank pain, frequency,  hematuria, urgency, vaginal bleeding and vaginal discharge.  Musculoskeletal: Negative for arthralgias and myalgias.  Skin: Negative for rash.  Neurological: Negative for dizziness, syncope, weakness, light-headedness, numbness and headaches.  Psychiatric/Behavioral: Negative for sleep disturbance. The patient is not nervous/anxious.      Physical Exam Updated Vital Signs BP (!) 114/96   Pulse 88   Temp 98.8 F (37.1 C) (Oral)   Resp 17   Ht  (1.626 m)   Wt 73.9 kg (163 lb)   LMP 09/26/2016 (Exact Date)   SpO2 100%   BMI 27.98 kg/m   Physical Exam  Constitutional: She is oriented to person, place, and time. She appears well-developed and well-nourished.  Non-toxic appearance. No distress.  HENT:  Head: Normocephalic and atraumatic.  Nose: Nose normal.  Mouth/Throat: Oropharynx is clear and moist.  Eyes: Pupils are equal, round, and reactive to light. Conjunctivae are normal. Right eye exhibits no discharge. Left eye exhibits no discharge.  Neck: Normal range of motion. Neck supple. No JVD present. No tracheal deviation present.  Cardiovascular: Normal rate, normal heart sounds and intact distal pulses.  Exam reveals no gallop and no friction rub.   No murmur heard. Tachycardia  Pulmonary/Chest: Effort normal and breath sounds normal. No accessory muscle usage. No respiratory distress. She has no decreased breath sounds. She has no wheezes. She has no rhonchi. She has no rales. She exhibits tenderness.    No hypoxia or tachypnea.  Abdominal: Soft. Bowel sounds are normal. She exhibits no distension. There is no tenderness. There is no rebound and no guarding.  Musculoskeletal: Normal range of motion.  No lower extremity edema or calf tenderness.  Lymphadenopathy:    She has no cervical adenopathy.  Neurological: She is alert and oriented to person, place, and time.  Skin: Skin is warm and dry. Capillary refill takes less than 2 seconds. She is not diaphoretic.    Psychiatric: Her behavior is normal. Judgment and thought content normal.  Nursing note and vitals reviewed.    ED Treatments / Results  Labs (all labs ordered are listed, but only abnormal results are displayed) Labs Reviewed  BASIC METABOLIC PANEL - Abnormal; Notable for the following:       Result Value   Potassium 3.3 (*)    CO2 18 (*)    Glucose, Bld 106 (*)    All other components within normal limits  CBC  D-DIMER, QUANTITATIVE (NOT AT [facility])  I-STAT TROPONIN, ED  I-STAT BETA HCG BLOOD, ED ([facility], [facility], [facility] ONLY)  I-STAT TROPONIN, ED    EKG  EKG Interpretation None       Radiology Dg Chest 2 View  Result Date: 09/27/2016 CLINICAL DATA:  Chest pain EXAM: CHEST  2 VIEW COMPARISON:  03/08/2015 FINDINGS: The heart size and mediastinal contours are within normal limits. Both lungs are clear. The visualized skeletal structures are unremarkable. IMPRESSION: No active cardiopulmonary disease. Electronically Signed   By: Jasmine Pang M.D.   On: 09/27/2016 19:24    Procedures Procedures (including critical care time)  Medications Ordered in ED Medications - No data to display  Initial Impression / Assessment and Plan / ED Course  I have reviewed the triage vital signs and the nursing notes.  Pertinent labs & imaging results that were available during my care of the patient were reviewed by me and considered in my medical decision making (see chart for details).     Pt presents to the Ed today with complaints of cp.Chest pain was pleuritic. Patient reports history of tobacco use and is CP use but no other risk factors for PE. Patient is to be discharged with recommendation to follow up with PCP in regards to today's hospital visit. Chest pain is not likely of cardiac or pulmonary etiology d/t presentation, negative d dimer, VSS, no tracheal deviation, no JVD or new murmur, RRR, breath sounds equal bilaterally, EKG without any change from prior tracing and shows no signs of  ischemia, negative delta troponin, and negative CXR. Chest pain is reproducible on palpation. The patient was initially tachycardic however this improved with IV fluid bolus. Low suspicion for PE, ACS, dissection, pneumonia. Pt has been advised to return to the ED is CP becomes exertional, associated with diaphoresis or nausea, radiates to left jaw/arm, worsens or becomes concerning in any way. Pt appears reliable for follow up and is agreeable to discharge.   10 minute discussion about the importance of smoking cessation.  Case has been discussed with Dr. Donnald Garre who agrees with the above plan to discharge.     Final Clinical Impressions(s) / ED Diagnoses   Final diagnoses:  Atypical chest pain    New Prescriptions New Prescriptions   No medications on file     Wallace Keller 09/28/16 0119    Arby Barrette, MD 10/06/16 1745

## 2016-10-29 ENCOUNTER — Encounter (HOSPITAL_COMMUNITY): Payer: Self-pay

## 2016-10-29 ENCOUNTER — Emergency Department (HOSPITAL_COMMUNITY)
Admission: EM | Admit: 2016-10-29 | Discharge: 2016-10-29 | Disposition: A | Discharge status: Home / Self Care | Payer: Medicaid Other | Attending: Physician Assistant | Admitting: Physician Assistant

## 2016-10-29 DIAGNOSIS — R21 Rash and other nonspecific skin eruption: Secondary | ICD-10-CM | POA: Insufficient documentation

## 2016-10-29 DIAGNOSIS — F909 Attention-deficit hyperactivity disorder, unspecified type: Secondary | ICD-10-CM | POA: Insufficient documentation

## 2016-10-29 DIAGNOSIS — Z79899 Other long term (current) drug therapy: Secondary | ICD-10-CM | POA: Diagnosis not present

## 2016-10-29 DIAGNOSIS — F17210 Nicotine dependence, cigarettes, uncomplicated: Secondary | ICD-10-CM | POA: Diagnosis not present

## 2016-10-29 DIAGNOSIS — L299 Pruritus, unspecified: Secondary | ICD-10-CM | POA: Insufficient documentation

## 2016-10-29 MED ORDER — DIPHENHYDRAMINE HCL 25 MG PO CAPS: 25.0000 mg | ORAL_CAPSULE | Freq: Four times a day (QID) | ORAL | 0 refills | Status: DC | PRN

## 2016-10-29 MED ORDER — HYDROXYZINE HCL 25 MG PO TABS: 25.0000 mg | ORAL_TABLET | Freq: Four times a day (QID) | ORAL | 0 refills | Status: DC

## 2016-10-29 NOTE — ED Notes (Signed)
Came out to nurses station stated her throat is staring to get scratchy

## 2016-10-29 NOTE — ED Provider Notes (Signed)
[facility] EM[facility]ENC[facility] Provider Note   CSN: [id number] Arrival date & time: 10/29/16  [id number]     History   Chief Complaint Chief Complaint  Patient presents with  . Rash    HPI Jordan Hanson is a 21 y.o. female.  HPI   Jordan Hanson is a 21 year old female with a history of depression, bipolar 1 disorder, borderline personality disorder who presents the emergency department for evaluation of itchy rash on the arms and face. Patient states that she stayed at a hotel 2 nights ago and this morning woke up with bilateral forearm itching. She noticed as she was itching that she had a few raised red bumps on the skin. As she was driving to work she noticed that she had multiple raised red spots on her face. Since that time she noticed that she has one raised bump on her leg into more on her abdomen. She believes that these are bug bites from the hotel or from somewhere in her house. She has not taken any over-the-counter antihistamines or applied any topical agents to the skin. She denies shortness of breath, lip swelling, fever, joint pain. No new detergents, soaps, lotions. No recent travel outdoors.   Past Medical History:  Diagnosis Date  . ADHD (attention deficit hyperactivity disorder)   . Bipolar disorder (HCC)   . Depression     Patient Active Problem List   Diagnosis Date Noted  . Borderline personality disorder (HCC) 02/13/2016  . Bipolar 1 disorder, depressed (HCC) 02/10/2016  . Suicidal ideation 07/19/2014  . 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 MDD (major depressive disorder) 06/17/2014    Past Surgical History:  Procedure Laterality Date  . TONSILLECTOMY      OB History    No data available       Home Medications    Prior to Admission medications   Medication Sig Start Date End Date Taking? Authorizing Provider  cyclobenzaprine (FLEXERIL) 10 MG tablet Take 1 tablet (10 mg total) by  mouth 3 (three) times daily as needed for muscle spasms. 09/27/16   Rise MuLeaphart, Kenneth T, PA-C  dicyclomine (BENTYL) 20 MG tablet Take 1 tablet (20 mg total) by mouth 2 (two) times daily. 02/13/16   Adonis BrookAgustin, Sheila, NP  diphenhydrAMINE (BENADRYL) 25 mg capsule Take 1 capsule (25 mg total) by mouth every 6 (six) hours as needed. 10/29/16   Kellie ShropshireShrosbree, Emily J, PA-C  divalproex (DEPAKOTE ER) 500 MG 24 hr tablet Take 2 tablets (1,000 mg total) by mouth at bedtime. 02/13/16   Adonis BrookAgustin, Sheila, NP  hydrOXYzine (ATARAX/VISTARIL) 25 MG tablet Take 1 tablet (25 mg total) by mouth every 6 (six) hours as needed for anxiety. 02/13/16   Adonis BrookAgustin, Sheila, NP  hydrOXYzine (ATARAX/VISTARIL) 25 MG tablet Take 1 tablet (25 mg total) by mouth every 6 (six) hours. 10/29/16   Kellie ShropshireShrosbree, Emily J, PA-C  naproxen (NAPROSYN) 375 MG tablet Take 1 tablet (375 mg total) by mouth 2 (two) times daily. 09/27/16   Rise MuLeaphart, Kenneth T, PA-C  nicotine polacrilex (NICORETTE) 2 MG gum Take 1 each (2 mg total) by mouth as needed for smoking cessation. 02/13/16   Adonis BrookAgustin, Sheila, NP  QUEtiapine (SEROQUEL) 300 MG tablet Take 1 tablet (300 mg total) by mouth at bedtime. 02/13/16   Adonis BrookAgustin, Sheila, NP  sertraline (ZOLOFT) 50 MG tablet Take 3 tablets (150 mg total) by mouth daily. 02/14/16   Adonis BrookAgustin, Sheila, NP  traZODone (DESYREL) 50 MG tablet Take 1  tablet (50 mg total) by mouth at bedtime as needed for sleep. 02/13/16   Adonis Brook, NP    Family History History reviewed. No pertinent family history.  Social History Social History  Substance Use Topics  . Smoking status: Current Every Day Smoker    Packs/day: 0.50    Types: Cigarettes  . Smokeless tobacco: Never Used  . Alcohol use No     Comment: occ     Allergies   Geodon [ziprasidone hcl] and Penicillins   Review of Systems Review of Systems  Constitutional: Negative for chills, fatigue and fever.  HENT: Negative for trouble swallowing.   Respiratory: Negative for cough, shortness  of breath and wheezing.   Musculoskeletal: Negative for arthralgias, gait problem and joint swelling.  Skin: Positive for rash. Negative for color change, pallor and wound.  Neurological: Negative for weakness and numbness.     Physical Exam Updated Vital Signs BP (!) 132/94 (BP Location: Right Arm)   Pulse (!) 113   Temp 98.6 F (37 C) (Oral)   Resp 16   LMP 10/27/2016 (Within Days)   SpO2 98%   Physical Exam  Constitutional: She appears well-developed and well-nourished. No distress.  HENT:  Head: Normocephalic and atraumatic.  Mouth/Throat: Oropharynx is clear and moist. No oropharyngeal exudate.  No lip or facial swelling. Mucous membranes moist.  Eyes: Pupils are equal, round, and reactive to light. Conjunctivae are normal. Right eye exhibits no discharge. Left eye exhibits no discharge.  Neck: Normal range of motion. Neck supple.  Cardiovascular: Normal rate and regular rhythm.  Exam reveals no friction rub.   No murmur heard. Pulmonary/Chest: Effort normal and breath sounds normal. No respiratory distress. She has no wheezes. She has no rales.  Lymphadenopathy:    She has no cervical adenopathy.  Neurological: She is alert. Coordination normal.  Skin: Skin is warm and dry. Capillary refill takes less than 2 seconds. She is not diaphoretic.  Several small raised erythematous nodules on the bilateral forearms, face, abdomen and legs. Excoriations noted on the bilateral forearms with surrounding erythema. No break in the skin. See picture below.  Psychiatric: She has a normal mood and affect. Her behavior is normal.  Nursing note and vitals reviewed.        ED Treatments / Results  Labs (all labs ordered are listed, but only abnormal results are displayed) Labs Reviewed - No data to display  EKG  EKG Interpretation None       Radiology No results found.  Procedures Procedures (including critical care time)  Medications Ordered in ED Medications - No  data to display   Initial Impression / Assessment and Plan / ED Course  I have reviewed the triage vital signs and the nursing notes.  Pertinent labs & imaging results that were available during my care of the patient were reviewed by me and considered in my medical decision making (see chart for details).     Patient presents with what appears to be bug bites on the bilateral forearms, face, trunk and legs. No facial swelling, trouble breathing, respiratory distress. No fever, erythema, warmth to suggest infection requiring antibiotic intervention. Will send patient home with antihistamines to help with itching. Have counseled patient on strict return precautions including swelling of the face, trouble breathing, fever. Patient agrees and voices understanding at the bedside.  Final Clinical Impressions(s) / ED Diagnoses   Final diagnoses:  Rash  Itching    New Prescriptions New Prescriptions   DIPHENHYDRAMINE (BENADRYL) 25  MG CAPSULE    Take 1 capsule (25 mg total) by mouth every 6 (six) hours as needed.   HYDROXYZINE (ATARAX/VISTARIL) 25 MG TABLET    Take 1 tablet (25 mg total) by mouth every 6 (six) hours.     Kellie Shropshire, PA-C 10/29/16 1146    Mackuen, Cindee Salt, MD 10/29/16 1606

## 2016-10-29 NOTE — Discharge Instructions (Signed)
It appears to be bug bites on your face and arms. Please fill the prescription for hydroxyzine and take this every 6 hours as needed for itching. You can also take Benadryl for itching. Remember that Benadryl can make you drowsy, please do not drink alcohol or drive while taking this medication.  Please return to the emergency department if you develop swelling in her lips, face or have trouble breathing. Also return if you develop a fever or have any new or concerning symptoms.

## 2016-10-29 NOTE — ED Triage Notes (Signed)
Pt states she stayed in a hotel on Saturday. Woke up this morning at 0300 with severe itching. Pt concerned for bug bites. Rash noted to arms, trunk, neck, and face. Resu e/u.

## 2016-10-29 NOTE — ED Notes (Signed)
Patient states she went out of town this weekend , [place] night hotel wasn't so great, states she was fine until this am at 4am states she woke up itching all over with hives , states she hasn't used any new soaps or deterg. Hasn't seen any bugs.

## 2016-11-13 ENCOUNTER — Encounter (HOSPITAL_COMMUNITY): Payer: Self-pay | Admitting: Emergency Medicine

## 2016-11-13 ENCOUNTER — Ambulatory Visit (HOSPITAL_COMMUNITY)
Admission: EM | Admit: 2016-11-13 | Discharge: 2016-11-13 | Disposition: A | Discharge status: Home / Self Care | Payer: Medicaid Other

## 2016-11-13 NOTE — ED Triage Notes (Signed)
PT reports right knee pain. No injury known. PT does bend and lift at work.

## 2016-12-20 ENCOUNTER — Encounter (HOSPITAL_COMMUNITY): Payer: Self-pay | Admitting: *Deleted

## 2016-12-20 ENCOUNTER — Ambulatory Visit (HOSPITAL_COMMUNITY)
Admission: EM | Admit: 2016-12-20 | Discharge: 2016-12-20 | Disposition: A | Discharge status: Home / Self Care | Payer: Medicaid Other | Attending: Urgent Care | Admitting: Urgent Care

## 2016-12-20 ENCOUNTER — Other Ambulatory Visit: Payer: Self-pay

## 2016-12-20 DIAGNOSIS — J029 Acute pharyngitis, unspecified: Secondary | ICD-10-CM

## 2016-12-20 DIAGNOSIS — B9789 Other viral agents as the cause of diseases classified elsewhere: Secondary | ICD-10-CM

## 2016-12-20 DIAGNOSIS — J069 Acute upper respiratory infection, unspecified: Secondary | ICD-10-CM | POA: Diagnosis not present

## 2016-12-20 MED ORDER — BENZONATATE 100 MG PO CAPS: 100.0000 mg | ORAL_CAPSULE | Freq: Three times a day (TID) | ORAL | 0 refills | Status: DC | PRN

## 2016-12-20 MED ORDER — PSEUDOEPHEDRINE HCL ER 120 MG PO TB12
120.0000 mg | ORAL_TABLET | Freq: Two times a day (BID) | ORAL | 3 refills | Status: DC
Start: 2016-12-20 — End: 2017-12-11

## 2016-12-20 NOTE — ED Provider Notes (Signed)
   MRN: [id number] DOB: 08-29-1995  Subjective:   Jordan Hanson is a [age] y.o. female presenting for 3 day history of productive cough. Cough elicits sinus pressure, head pain, chest pain, wheezing. Also has scratchy sore throat, post-nasal drainage. Has tried DayQuil, NyQuil. Denies fever, sinus pain, ear pain, shob, n/v, abdominal pain, body aches, rashes. Smokes 1/3ppd. Denies history of asthma, allergies.   Jordan Hanson takes Depakote, Seroquel, Zoloft. She is allergic to geodon [ziprasidone hcl] and penicillins.  Jordan Hanson  has a past medical history of ADHD (attention deficit hyperactivity disorder), Bipolar disorder (HCC), and Depression. Also  has a past surgical history that includes Tonsillectomy.  Objective:   Vitals: BP 109/79 (BP Location: Left Arm)   Pulse (!) 105   Temp 98.6 F (37 C) (Oral)   LMP 12/03/2016 (Approximate)   SpO2 97%   Physical Exam  Constitutional: She is oriented to person, place, and time. She appears well-developed and well-nourished.  HENT:  TM's intact bilaterally, no effusions or erythema. Nasal turbinates pink and moist, nasal passages patent. No sinus tenderness. Oropharynx clear, mucous membranes moist.   Eyes: Right eye exhibits no discharge. Left eye exhibits no discharge.  Neck: Normal range of motion. Neck supple.  Cardiovascular: Normal rate, regular rhythm and intact distal pulses. Exam reveals no gallop and no friction rub.  No murmur heard. Pulmonary/Chest: No respiratory distress. She has no wheezes. She has no rales.  Lymphadenopathy:    She has no cervical adenopathy.  Neurological: She is alert and oriented to person, place, and time.  Skin: Skin is warm and dry.   Assessment and Plan :   Viral URI with cough  Sore throat  Will manage supportively for viral illness. Return-to-clinic precautions discussed, patient verbalized understanding.   Jordan BambergMario Kimla Furth, PA-C [facility]  12/20/2016  2:38 PM    Jordan Hanson, Jordan Brickel, PA-C 12/20/16  1601

## 2016-12-20 NOTE — Discharge Instructions (Signed)
For sore throat try using a honey-based tea. Use 3 teaspoons of honey with juice squeezed from half lemon. Place shaved pieces of ginger into 1/2-1 cup of water and warm over stove top. Then mix the ingredients and repeat every 4 hours as needed. You may take 500mg  Tylenol with ibuprofen 400-600mg  with food every 6 hours for pain and inflammation. Hydrate well with at least 2 liters (1 gallon) of water daily.

## 2016-12-20 NOTE — ED Triage Notes (Signed)
Per pt she having consistent cough that hurts, per pt her mucus is brown and yellow, per pt she's having itchy sore throat, per pt she's having headache pressure from all the coughing, per pt it started 3 days ago, per pt she's having drainage,

## 2017-01-08 HISTORY — PX: DILATION AND CURETTAGE OF UTERUS: SHX78

## 2017-01-29 ENCOUNTER — Emergency Department (HOSPITAL_COMMUNITY)
Admission: EM | Admit: 2017-01-29 | Discharge: 2017-01-29 | Disposition: A | Discharge status: Home / Self Care | Payer: Medicaid Other | Attending: Emergency Medicine | Admitting: Emergency Medicine

## 2017-01-29 ENCOUNTER — Encounter (HOSPITAL_COMMUNITY): Payer: Self-pay

## 2017-01-29 ENCOUNTER — Ambulatory Visit (HOSPITAL_COMMUNITY)
Admission: EM | Admit: 2017-01-29 | Discharge: 2017-01-29 | Disposition: A | Discharge status: Home / Self Care | Payer: Medicaid Other

## 2017-01-29 ENCOUNTER — Emergency Department (HOSPITAL_COMMUNITY): Payer: Medicaid Other

## 2017-01-29 DIAGNOSIS — M545 Low back pain, unspecified: Secondary | ICD-10-CM

## 2017-01-29 DIAGNOSIS — Z79899 Other long term (current) drug therapy: Secondary | ICD-10-CM | POA: Diagnosis not present

## 2017-01-29 DIAGNOSIS — W108XXA Fall (on) (from) other stairs and steps, initial encounter: Secondary | ICD-10-CM | POA: Diagnosis not present

## 2017-01-29 DIAGNOSIS — R51 Headache: Secondary | ICD-10-CM | POA: Insufficient documentation

## 2017-01-29 DIAGNOSIS — W19XXXA Unspecified fall, initial encounter: Secondary | ICD-10-CM

## 2017-01-29 DIAGNOSIS — S0990XA Unspecified injury of head, initial encounter: Secondary | ICD-10-CM | POA: Diagnosis present

## 2017-01-29 DIAGNOSIS — Y93K1 Activity, walking an animal: Secondary | ICD-10-CM | POA: Insufficient documentation

## 2017-01-29 DIAGNOSIS — F17210 Nicotine dependence, cigarettes, uncomplicated: Secondary | ICD-10-CM | POA: Insufficient documentation

## 2017-01-29 DIAGNOSIS — Y929 Unspecified place or not applicable: Secondary | ICD-10-CM | POA: Insufficient documentation

## 2017-01-29 DIAGNOSIS — S0083XA Contusion of other part of head, initial encounter: Secondary | ICD-10-CM | POA: Insufficient documentation

## 2017-01-29 DIAGNOSIS — F0781 Postconcussional syndrome: Secondary | ICD-10-CM | POA: Diagnosis not present

## 2017-01-29 DIAGNOSIS — Y999 Unspecified external cause status: Secondary | ICD-10-CM | POA: Diagnosis not present

## 2017-01-29 MED ORDER — NAPROXEN 375 MG PO TABS: 375.0000 mg | ORAL_TABLET | Freq: Two times a day (BID) | ORAL | 0 refills | Status: DC

## 2017-01-29 MED ORDER — NAPROXEN 250 MG PO TABS
500.0000 mg | ORAL_TABLET | Freq: Once | ORAL | Status: AC
  Administered 2017-01-29: 500 mg via ORAL
  Filled 2017-01-29: qty 2

## 2017-01-29 MED ORDER — ACETAMINOPHEN 500 MG PO TABS: 500.0000 mg | ORAL_TABLET | Freq: Four times a day (QID) | ORAL | 0 refills | Status: DC | PRN

## 2017-01-29 MED ORDER — BACITRACIN ZINC 500 UNIT/GM EX OINT: 1.0000 "application " | TOPICAL_OINTMENT | Freq: Two times a day (BID) | CUTANEOUS | 0 refills | Status: DC

## 2017-01-29 NOTE — ED Notes (Addendum)
Pt transported to CT. Per CT will transport pt back to A10 when finished

## 2017-01-29 NOTE — ED Provider Notes (Signed)
[facility] EMERGENCY DEPARTMENT Provider Note   CSN: [id number] Arrival date & time: 01/29/17  1049     History   Chief Complaint No chief complaint on file.   HPI Jordan Hanson is a 22 y.o. female.  Jordan Hanson is a 22 y.o. Female who presents to the emergency department after a trip and fall prior to arrival.  Patient reports that around 1:30 AM, or about 12 hours prior to my evaluation patient tripped and fell down a flight of stairs.  Patient reports she was with her dogs who became excited and she tripped on their leashes falling down the stairs.  She reports that she believes she lost consciousness.  She reports hitting her head in multiple spots.  She complains of bilateral low back pain.  She reports this afternoon she developed feeling very foggy and irritable.  She reports she was counting money and that needed to count at several times to make sure that this was correct.  She complains of a generalized headache.  She also reports she has been having some muffled hearing since the fall.  She reports she can hear out of her right ear, but that it is muffled.  She also reports an abrasion to her finger.  She reports her tetanus is up-to-date.  No treatments attempted prior to arrival.  She denies fevers, vomiting, abdominal pain, diarrhea, chest pain, shortness of breath, palpitations, neck pain, numbness, tingling, weakness or rashes.   The history is provided by the patient and medical records. No language interpreter was used.    Past Medical History:  Diagnosis Date  . ADHD (attention deficit hyperactivity disorder)   . Bipolar disorder (HCC)   . Depression     Patient Active Problem List   Diagnosis Date Noted  . Borderline personality disorder (HCC) 02/13/2016  . Bipolar 1 disorder, depressed (HCC) 02/10/2016  . Suicidal ideation 07/19/2014  . 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 MDD (major  depressive disorder) 06/17/2014    Past Surgical History:  Procedure Laterality Date  . TONSILLECTOMY      OB History    No data available       Home Medications    Prior to Admission medications   Medication Sig Start Date End Date Taking? Authorizing Provider  acetaminophen (TYLENOL) 500 MG tablet Take 1 tablet (500 mg total) by mouth every 6 (six) hours as needed for headache. 01/29/17   Everlene Farrier, PA-C  bacitracin ointment Apply 1 application topically 2 (two) times daily. 01/29/17   Everlene Farrier, PA-C  benzonatate (TESSALON) 100 MG capsule Take 1-2 capsules (100-200 mg total) by mouth 3 (three) times daily as needed for cough. 12/20/16   Wallis Bamberg, PA-C  cyclobenzaprine (FLEXERIL) 10 MG tablet Take 1 tablet (10 mg total) by mouth 3 (three) times daily as needed for muscle spasms. 09/27/16   Rise Mu, PA-C  dicyclomine (BENTYL) 20 MG tablet Take 1 tablet (20 mg total) by mouth 2 (two) times daily. 02/13/16   Adonis Brook, NP  diphenhydrAMINE (BENADRYL) 25 mg capsule Take 1 capsule (25 mg total) by mouth every 6 (six) hours as needed. 10/29/16   Kellie Shropshire, PA-C  divalproex (DEPAKOTE ER) 500 MG 24 hr tablet Take 2 tablets (1,000 mg total) by mouth at bedtime. 02/13/16   Adonis Brook, NP  hydrOXYzine (ATARAX/VISTARIL) 25 MG tablet Take 1 tablet (25 mg total) by mouth every 6 (six) hours as needed  for anxiety. 02/13/16   Adonis BrookAgustin, Sheila, NP  hydrOXYzine (ATARAX/VISTARIL) 25 MG tablet Take 1 tablet (25 mg total) by mouth every 6 (six) hours. 10/29/16   Kellie ShropshireShrosbree, Emily J, PA-C  naproxen (NAPROSYN) 375 MG tablet Take 1 tablet (375 mg total) by mouth 2 (two) times daily with a meal. 01/29/17   Everlene Farrieransie, Yumna Ebers, PA-C  nicotine polacrilex (NICORETTE) 2 MG gum Take 1 each (2 mg total) by mouth as needed for smoking cessation. 02/13/16   Adonis BrookAgustin, Sheila, NP  pseudoephedrine (SUDAFED 12 HOUR) 120 MG 12 hr tablet Take 1 tablet (120 mg total) by mouth 2 (two) times daily.  12/20/16   Wallis BambergMani, Mario, PA-C  QUEtiapine (SEROQUEL) 300 MG tablet Take 1 tablet (300 mg total) by mouth at bedtime. 02/13/16   Adonis BrookAgustin, Sheila, NP  sertraline (ZOLOFT) 50 MG tablet Take 3 tablets (150 mg total) by mouth daily. 02/14/16   Adonis BrookAgustin, Sheila, NP  traZODone (DESYREL) 50 MG tablet Take 1 tablet (50 mg total) by mouth at bedtime as needed for sleep. 02/13/16   Adonis BrookAgustin, Sheila, NP    Family History No family history on file.  Social History Social History   Tobacco Use  . Smoking status: Current Every Day Smoker    Packs/day: 0.50    Types: Cigarettes  . Smokeless tobacco: Never Used  Substance Use Topics  . Alcohol use: No    Comment: occ  . Drug use: No    Comment: smokes weed every day     Allergies   Geodon [ziprasidone hcl] and Penicillins   Review of Systems Review of Systems  Constitutional: Negative for chills and fever.  HENT: Negative for congestion, ear discharge, ear pain, hearing loss (muffled hearing. ), nosebleeds, sore throat and trouble swallowing.   Eyes: Negative for pain and visual disturbance.  Respiratory: Negative for cough, shortness of breath and wheezing.   Cardiovascular: Negative for chest pain and palpitations.  Gastrointestinal: Negative for abdominal pain, diarrhea, nausea and vomiting.  Genitourinary: Negative for difficulty urinating, dysuria and hematuria.  Musculoskeletal: Negative for back pain and neck pain.  Skin: Negative for rash.  Neurological: Positive for light-headedness and headaches. Negative for weakness and numbness.     Physical Exam Updated Vital Signs BP 123/76 (BP Location: Left Arm)   Pulse 100   Temp 98.4 F (36.9 C) (Oral)   Resp 14   LMP 01/29/2017 (Exact Date)   SpO2 100%   Physical Exam  Constitutional: She is oriented to person, place, and time. She appears well-developed and well-nourished. No distress.  Nontoxic-appearing.  HENT:  Head: Normocephalic and atraumatic.  Right Ear: External ear  normal.  Left Ear: External ear normal.  Mouth/Throat: Oropharynx is clear and moist.  Bilateral tympanic membranes are pearly-gray without erythema or loss of landmarks. No mastoid tenderness bilaterally. Mild edema noted to right cheek, without ecchymosis or facial bone tenderness.  Hearing is grossly intact bilaterally.   Eyes: Conjunctivae and EOM are normal. Pupils are equal, round, and reactive to light. Right eye exhibits no discharge. Left eye exhibits no discharge.  Neck: Normal range of motion. Neck supple. No JVD present. No tracheal deviation present.  No midline neck TTP.   Cardiovascular: Normal rate, regular rhythm, normal heart sounds and intact distal pulses. Exam reveals no gallop and no friction rub.  No murmur heard. Pulmonary/Chest: Effort normal and breath sounds normal. No respiratory distress. She has no wheezes. She has no rales. She exhibits no tenderness.  Lungs are clear to ascultation bilaterally.  Symmetric chest expansion bilaterally. No increased work of breathing. No rales or rhonchi.    Abdominal: Soft. There is no tenderness. There is no guarding.  Musculoskeletal: Normal range of motion. She exhibits tenderness. She exhibits no edema or deformity.  Tenderness across her bilateral low back musculature.  There is also superficial overlying abrasion without bleeding.  No midline back tenderness to palpation.  Patient's bilateral shoulder, elbow, wrist, hip, knee and ankle joints are supple and nontender to palpation.  Normal gait.  Lymphadenopathy:    She has no cervical adenopathy.  Neurological: She is alert and oriented to person, place, and time. She displays normal reflexes. No cranial nerve deficit or sensory deficit. She exhibits normal muscle tone. Coordination normal.  Patient is alert and oriented 3. Speech is clear and coherent. Cranial nerves are intact. Sensation and strength is intact to bilateral upper and lower extremities.  No pronator drift.   Finger to nose intact bilaterally.  Normal gait.  Bilateral patellar DTRs are intact.  Skin: Skin is warm and dry. Capillary refill takes less than 2 seconds. No rash noted. She is not diaphoretic. No erythema. No pallor.  superficial abrasion noted to left 5th finger. No bleeding.   Psychiatric: She has a normal mood and affect. Her behavior is normal.  Nursing note and vitals reviewed.    ED Treatments / Results  Labs (all labs ordered are listed, but only abnormal results are displayed) Labs Reviewed - No data to display  EKG  EKG Interpretation None       Radiology Ct Head Wo Contrast  Result Date: 01/29/2017 CLINICAL DATA:  Fall down stairs, headache, blurred vision, loss of hearing in right ear EXAM: CT HEAD WITHOUT CONTRAST CT CERVICAL SPINE WITHOUT CONTRAST TECHNIQUE: Multidetector CT imaging of the head and cervical spine was performed following the standard protocol without intravenous contrast. Multiplanar CT image reconstructions of the cervical spine were also generated. COMPARISON:  05/30/2014 FINDINGS: CT HEAD FINDINGS Brain: No evidence of acute infarction, hemorrhage, hydrocephalus, extra-axial collection or mass lesion/mass effect. Vascular: No hyperdense vessel or unexpected calcification. Skull: Normal. Negative for fracture or focal lesion. Sinuses/Orbits: The visualized paranasal sinuses are essentially clear. The mastoid air cells are unopacified. Other: None. CT CERVICAL SPINE FINDINGS Alignment: Straightening of the cervical spine, likely positional. Skull base and vertebrae: No acute fracture. No primary bone lesion or focal pathologic process. 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 Soft tissues and spinal canal: No prevertebral fluid or swelling. No visible canal hematoma. Disc levels: Vertebral body heights and intervertebral disc spaces are maintained. Spinal canal is patent. Upper chest: Visualized lung apices are clear. Other:  Visualized thyroid is unremarkable. IMPRESSION: Normal head CT. Normal cervical spine CT. Electronically Signed   By: Charline Bills M.D.   On: 01/29/2017 12:40   Ct Cervical Spine Wo Contrast  Result Date: 01/29/2017 CLINICAL DATA:  Fall down stairs, headache, blurred vision, loss of hearing in right ear EXAM: CT HEAD WITHOUT CONTRAST CT CERVICAL SPINE WITHOUT CONTRAST TECHNIQUE: Multidetector CT imaging of the head and cervical spine was performed following the standard protocol without intravenous contrast. Multiplanar CT image reconstructions of the cervical spine were also generated. COMPARISON:  05/30/2014 FINDINGS: CT HEAD FINDINGS Brain: No evidence of acute infarction, hemorrhage, hydrocephalus, extra-axial collection or mass lesion/mass effect. Vascular: No hyperdense vessel or unexpected calcification. Skull: Normal. Negative for fracture or focal lesion. Sinuses/Orbits: The visualized paranasal sinuses are essentially clear. The mastoid air cells are unopacified.  Other: None. CT CERVICAL SPINE FINDINGS Alignment: Straightening of the cervical spine, likely positional. Skull base and vertebrae: No acute fracture. No primary bone lesion or focal pathologic process. 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 Soft tissues and spinal canal: No prevertebral fluid or swelling. No visible canal hematoma. Disc levels: Vertebral body heights and intervertebral disc spaces are maintained. Spinal canal is patent. Upper chest: Visualized lung apices are clear. Other: Visualized thyroid is unremarkable. IMPRESSION: Normal head CT. Normal cervical spine CT. Electronically Signed   By: Charline Bills M.D.   On: 01/29/2017 12:40    Procedures Procedures (including critical care time)  Medications Ordered in ED Medications  naproxen (NAPROSYN) tablet 500 mg (500 mg Oral Given 01/29/17 1312)     Initial Impression / Assessment and Plan / ED Course  I have reviewed the  triage vital signs and the nursing notes.  Pertinent labs & imaging results that were available during my care of the patient were reviewed by me and considered in my medical decision making (see chart for details).    This is a 22 y.o. Female who presents to the emergency department after a trip and fall prior to arrival.  Patient reports that around 1:30 AM, or about 12 hours prior to my evaluation patient tripped and fell down a flight of stairs.  Patient reports she was with her dogs who became excited and she tripped on their leashes falling down the stairs.  She reports that she believes she lost consciousness.  She reports hitting her head in multiple spots.  She complains of bilateral low back pain.  She reports this afternoon she developed feeling very foggy and irritable.  She reports she was counting money and that needed to count at several times to make sure that this was correct.  She complains of a generalized headache.  She also reports she has been having some muffled hearing since the fall.  She reports she can hear out of her right ear, but that it is muffled.  She also reports an abrasion to her finger.  She reports her tetanus is up-to-date.   On exam the patient is afebrile nontoxic-appearing.  She has no focal neurological deficits.  She is able to ambulate with normal gait.  She is tenderness across her bilateral low back musculature.  No midline neck or back tenderness.  CT scans were obtained of her head and cervical spine in triage.  These were both unremarkable.  I advised if her muffled hearing persist she can follow-up with ENT.  Currently her hearing is grossly intact.  Will use naproxen and Tylenol for pain control.  I advised that she has postconcussive syndrome and encouraged her to avoid screen time as well as get lots of rest.  I encouraged her to follow-up with primary care before returning back to work.  Return precautions discussed. I advised the patient to follow-up with  their primary care provider this week. I advised the patient to return to the emergency department with new or worsening symptoms or new concerns. The patient verbalized understanding and agreement with plan.     Final Clinical Impressions(s) / ED Diagnoses   Final diagnoses:  Fall, initial encounter  Post concussion syndrome  Acute bilateral low back pain without sciatica    ED Discharge Orders        Ordered    naproxen (NAPROSYN) 375 MG tablet  2 times daily with meals     01/29/17 1312  acetaminophen (TYLENOL) 500 MG tablet  Every 6 hours PRN     01/29/17 1312    bacitracin ointment  2 times daily     01/29/17 1312       Everlene Farrier, PA-C 01/29/17 1334    Eber Hong, MD 01/29/17 [phone]

## 2017-01-29 NOTE — ED Notes (Signed)
Patient reports falling down 15 steps at home last night-1 am.  Says she tripped over her dogs.  Patient says she knows she blacked out.  Patient says she has head pain, right side of face is painful and decreased hearing in right ear.  Grossly, no injury noted .  Band aid on finger of right hand.  Reports low back pain.  Patient is alert and oriented x 4.  Patient answering appropriately.  Patient mae x 4 .  Dr Milus Glazierlauenstein notified and spoke with patient in intake room.  Patient opted to go to ed for work up.

## 2017-01-29 NOTE — ED Triage Notes (Signed)
Patient reports that she slipped and fell down approximately 20 steps this am 0100 while taking her dogs out with positive loc. Complains of headache with bruising to face and back pain. Dizziness since fall. Alert and oriented,PERLA.

## 2017-04-01 ENCOUNTER — Encounter (HOSPITAL_COMMUNITY): Payer: Self-pay | Admitting: Emergency Medicine

## 2017-04-01 ENCOUNTER — Emergency Department (HOSPITAL_COMMUNITY)
Admission: EM | Admit: 2017-04-01 | Discharge: 2017-04-01 | Disposition: A | Discharge status: Home / Self Care | Payer: Medicaid Other | Attending: Emergency Medicine | Admitting: Emergency Medicine

## 2017-04-01 DIAGNOSIS — Z5321 Procedure and treatment not carried out due to patient leaving prior to being seen by health care provider: Secondary | ICD-10-CM | POA: Diagnosis not present

## 2017-04-01 DIAGNOSIS — N644 Mastodynia: Secondary | ICD-10-CM | POA: Diagnosis not present

## 2017-04-01 LAB — URINALYSIS, ROUTINE W REFLEX MICROSCOPIC
BILIRUBIN URINE: NEGATIVE
GLUCOSE, UA: NEGATIVE mg/dL
KETONES UR: NEGATIVE mg/dL
LEUKOCYTES UA: NEGATIVE
NITRITE: NEGATIVE
PH: 5 (ref 5.0–8.0)
Protein, ur: 30 mg/dL — AB
Specific Gravity, Urine: 1.016 (ref 1.005–1.030)

## 2017-04-01 LAB — POC URINE PREG, ED: Preg Test, Ur: NEGATIVE

## 2017-04-01 NOTE — ED Triage Notes (Signed)
Pt states yesterday she began to have a sharp pain in her right breast and sometimes the pain goes into her right back, pt states it feels like a "nerve pain".

## 2017-04-03 ENCOUNTER — Emergency Department (HOSPITAL_COMMUNITY)
Admission: EM | Admit: 2017-04-03 | Discharge: 2017-04-03 | Disposition: A | Discharge status: Home / Self Care | Payer: Medicaid Other | Attending: Emergency Medicine | Admitting: Emergency Medicine

## 2017-04-03 DIAGNOSIS — Z79899 Other long term (current) drug therapy: Secondary | ICD-10-CM | POA: Insufficient documentation

## 2017-04-03 DIAGNOSIS — R112 Nausea with vomiting, unspecified: Secondary | ICD-10-CM | POA: Diagnosis present

## 2017-04-03 DIAGNOSIS — F17210 Nicotine dependence, cigarettes, uncomplicated: Secondary | ICD-10-CM | POA: Insufficient documentation

## 2017-04-03 DIAGNOSIS — Z590 Homelessness: Secondary | ICD-10-CM | POA: Diagnosis not present

## 2017-04-03 LAB — I-STAT BETA HCG BLOOD, ED (MC, WL, AP ONLY)

## 2017-04-03 LAB — I-STAT CHEM 8, ED
BUN: 13 mg/dL (ref 6–20)
Calcium, Ion: 1.22 mmol/L (ref 1.15–1.40)
Chloride: 105 mmol/L (ref 101–111)
Creatinine, Ser: 0.5 mg/dL (ref 0.44–1.00)
Glucose, Bld: 88 mg/dL (ref 65–99)
HEMATOCRIT: 39 % (ref 36.0–46.0)
HEMOGLOBIN: 13.3 g/dL (ref 12.0–15.0)
Potassium: 3.6 mmol/L (ref 3.5–5.1)
SODIUM: 139 mmol/L (ref 135–145)
TCO2: 24 mmol/L (ref 22–32)

## 2017-04-03 MED ORDER — ONDANSETRON 8 MG PO TBDP
8.0000 mg | ORAL_TABLET | Freq: Once | ORAL | Status: AC
  Administered 2017-04-03: 8 mg via ORAL
  Filled 2017-04-03: qty 1

## 2017-04-03 MED ORDER — PROMETHAZINE HCL 25 MG PO TABS: 25.0000 mg | ORAL_TABLET | Freq: Four times a day (QID) | ORAL | 0 refills | Status: DC | PRN

## 2017-04-03 NOTE — ED Notes (Signed)
Patient given a meal tray with medication. Tolerating medication and food at this time.

## 2017-04-03 NOTE — Progress Notes (Signed)
CSW met with patient via bedside regarding current living situation/ new stressors. Patient recently became homeless after leaving long-term boyfriend. Patient has been sleeping in her car since then and has been have difficultly with work/ getting food. Patient has been unable to "get into" any shelters and requested assistance. Patient presented very tearful about her situation however states " I am doing the best I can". CSW provided patient with "[employer] [place]" which lists free meals within [place]. CSW also provided patient with list of shelter within [place]/ [place]. Patient appreciated CSW and did not have any further questions.   Kingsley Spittle, [facility] Emergency Room Clinical Social Worker [phone]

## 2017-04-03 NOTE — ED Notes (Signed)
[facility] Expected date:  Expected time:  Means of arrival:  Comments: Pt in bed

## 2017-04-03 NOTE — ED Triage Notes (Signed)
Pt called from lobby with no answer. 

## 2017-04-03 NOTE — ED Triage Notes (Signed)
Pt states that for two days she's been very dizzy and foggy, she states that she can't remember what she's doing at work Pt denies an URI, abd pain, or vomiting Pt says she hasn't been eating or sleeping

## 2017-04-03 NOTE — ED Provider Notes (Signed)
[facility]-EMERGENCY DEPT Provider Note   CSN: [id number] Arrival date & time: 04/03/17  0153     History   Chief Complaint Chief Complaint  Patient presents with  . Generalized Body Aches  . Dizziness    HPI Jordan Hanson is a 22 y.o. female.  HPI Patient is a 22 year old female presents with nausea vomiting decreased oral intake.  She is currently sleeping in her car.  She is having more difficulty concentrating at work.  No fevers or chills no urinary symptoms.  Denies diarrhea.  No abdominal pain.  No chest pain or shortness of breath.  She is tearful without suicidal or homicidal thoughts.   Past Medical History:  Diagnosis Date  . ADHD (attention deficit hyperactivity disorder)   . Bipolar disorder (HCC)   . Depression     Patient Active Problem List   Diagnosis Date Noted  . Borderline personality disorder (HCC) 02/13/2016  . Bipolar 1 disorder, depressed (HCC) 02/10/2016  . Suicidal ideation 07/19/2014  . 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 MDD (major depressive disorder) 06/17/2014    Past Surgical History:  Procedure Laterality Date  . TONSILLECTOMY       OB History   None      Home Medications    Prior to Admission medications   Medication Sig Start Date End Date Taking? Authorizing Provider  acetaminophen (TYLENOL) 500 MG tablet Take 1 tablet (500 mg total) by mouth every 6 (six) hours as needed for headache. 01/29/17   Everlene Farrieransie, William, PA-C  bacitracin ointment Apply 1 application topically 2 (two) times daily. 01/29/17   Everlene Farrieransie, William, PA-C  benzonatate (TESSALON) 100 MG capsule Take 1-2 capsules (100-200 mg total) by mouth 3 (three) times daily as needed for cough. 12/20/16   Wallis BambergMani, Mario, PA-C  cyclobenzaprine (FLEXERIL) 10 MG tablet Take 1 tablet (10 mg total) by mouth 3 (three) times daily as needed for muscle spasms. 09/27/16   Rise MuLeaphart, Kenneth T, PA-C  dicyclomine  (BENTYL) 20 MG tablet Take 1 tablet (20 mg total) by mouth 2 (two) times daily. 02/13/16   Adonis BrookAgustin, Sheila, NP  diphenhydrAMINE (BENADRYL) 25 mg capsule Take 1 capsule (25 mg total) by mouth every 6 (six) hours as needed. 10/29/16   Kellie ShropshireShrosbree, Emily J, PA-C  divalproex (DEPAKOTE ER) 500 MG 24 hr tablet Take 2 tablets (1,000 mg total) by mouth at bedtime. 02/13/16   Adonis BrookAgustin, Sheila, NP  hydrOXYzine (ATARAX/VISTARIL) 25 MG tablet Take 1 tablet (25 mg total) by mouth every 6 (six) hours as needed for anxiety. 02/13/16   Adonis BrookAgustin, Sheila, NP  hydrOXYzine (ATARAX/VISTARIL) 25 MG tablet Take 1 tablet (25 mg total) by mouth every 6 (six) hours. 10/29/16   Kellie ShropshireShrosbree, Emily J, PA-C  naproxen (NAPROSYN) 375 MG tablet Take 1 tablet (375 mg total) by mouth 2 (two) times daily with a meal. 01/29/17   Everlene Farrieransie, William, PA-C  nicotine polacrilex (NICORETTE) 2 MG gum Take 1 each (2 mg total) by mouth as needed for smoking cessation. 02/13/16   Adonis BrookAgustin, Sheila, NP  promethazine (PHENERGAN) 25 MG tablet Take 1 tablet (25 mg total) by mouth every 6 (six) hours as needed for nausea or vomiting. 04/03/17   Azalia Bilisampos, Jhovani Griswold, MD  pseudoephedrine (SUDAFED 12 HOUR) 120 MG 12 hr tablet Take 1 tablet (120 mg total) by mouth 2 (two) times daily. 12/20/16   Wallis BambergMani, Mario, PA-C  QUEtiapine (SEROQUEL) 300 MG tablet Take 1 tablet (300 mg total) by mouth  at bedtime. 02/13/16   Adonis Brook, NP  sertraline (ZOLOFT) 50 MG tablet Take 3 tablets (150 mg total) by mouth daily. 02/14/16   Adonis Brook, NP  traZODone (DESYREL) 50 MG tablet Take 1 tablet (50 mg total) by mouth at bedtime as needed for sleep. 02/13/16   Adonis Brook, NP    Family History No family history on file.  Social History Social History   Tobacco Use  . Smoking status: Current Every Day Smoker    Packs/day: 0.50    Types: Cigarettes  . Smokeless tobacco: Never Used  Substance Use Topics  . Alcohol use: No    Comment: occ  . Drug use: No    Comment: smokes weed every  day     Allergies   Geodon [ziprasidone hcl] and Penicillins   Review of Systems Review of Systems  All other systems reviewed and are negative.    Physical Exam Updated Vital Signs BP 120/82 (BP Location: Left Arm)   Pulse 85   Temp 98.4 F (36.9 C) (Oral)   Resp 20   Ht 5\' 4"  (1.626 m)   Wt 72.1 kg (159 lb)   LMP 02/08/2017   SpO2 100%   BMI 27.29 kg/m   Physical Exam  Constitutional: She is oriented to person, place, and time. She appears well-developed and well-nourished. No distress.  HENT:  Head: Normocephalic and atraumatic.  Eyes: EOM are normal.  Neck: Normal range of motion.  Cardiovascular: Normal rate, regular rhythm and normal heart sounds.  Pulmonary/Chest: Effort normal and breath sounds normal.  Abdominal: Soft. She exhibits no distension. There is no tenderness.  Musculoskeletal: Normal range of motion.  Neurological: She is alert and oriented to person, place, and time.  Skin: Skin is warm and dry.  Psychiatric: She has a normal mood and affect. Judgment normal.  Nursing note and vitals reviewed.    ED Treatments / Results  Labs (all labs ordered are listed, but only abnormal results are displayed) Labs Reviewed  I-STAT CHEM 8, ED  I-STAT BETA HCG BLOOD, ED ([facility], [facility], [facility] ONLY)    EKG None  Radiology No results found.  Procedures Procedures (including critical care time)  Medications Ordered in ED Medications  ondansetron (ZOFRAN-ODT) disintegrating tablet 8 mg (8 mg Oral Given 04/03/17 1022)     Initial Impression / Assessment and Plan / ED Course  I have reviewed the triage vital signs and the nursing notes.  Pertinent labs & imaging results that were available during my care of the patient were reviewed by me and considered in my medical decision making (see chart for details).     Overall well-appearing.  Patient is hungry and requesting food.  She has poor access to food and housing.  Case management is seen and evaluated  the patient and provide additional outpatient resources.  Home with Phenergan.  No urinary symptoms.  Well-appearing.  Abdominal exam is benign.  Patient understands return to the ER for new or worsening symptoms.  Patient sat and ate comfortably in the hall an entire meal tray.  Final Clinical Impressions(s) / ED Diagnoses   Final diagnoses:  Nausea and vomiting, intractability of vomiting not specified, unspecified vomiting type    ED Discharge Orders        Ordered    promethazine (PHENERGAN) 25 MG tablet  Every 6 hours PRN     04/03/17 1147       Azalia Bilis, MD 04/03/17 1151

## 2017-08-28 ENCOUNTER — Encounter (HOSPITAL_COMMUNITY): Payer: Self-pay

## 2017-08-28 ENCOUNTER — Other Ambulatory Visit: Payer: Self-pay

## 2017-08-28 ENCOUNTER — Ambulatory Visit (HOSPITAL_COMMUNITY)
Admission: EM | Admit: 2017-08-28 | Discharge: 2017-08-28 | Disposition: A | Discharge status: Home / Self Care | Payer: Medicaid Other | Attending: Family Medicine | Admitting: Family Medicine

## 2017-08-28 DIAGNOSIS — K529 Noninfective gastroenteritis and colitis, unspecified: Secondary | ICD-10-CM

## 2017-08-28 DIAGNOSIS — R197 Diarrhea, unspecified: Secondary | ICD-10-CM | POA: Diagnosis not present

## 2017-08-28 DIAGNOSIS — R112 Nausea with vomiting, unspecified: Secondary | ICD-10-CM

## 2017-08-28 MED ORDER — ONDANSETRON 4 MG PO TBDP
ORAL_TABLET | ORAL | Status: AC
  Filled 2017-08-28: qty 1

## 2017-08-28 MED ORDER — ONDANSETRON 4 MG PO TBDP
4.0000 mg | ORAL_TABLET | Freq: Once | ORAL | Status: AC
  Administered 2017-08-28: 4 mg via ORAL

## 2017-08-28 MED ORDER — ONDANSETRON HCL 4 MG PO TABS: 4.0000 mg | ORAL_TABLET | Freq: Four times a day (QID) | ORAL | 0 refills | Status: DC | PRN

## 2017-08-28 NOTE — ED Triage Notes (Signed)
fatigued and vomiting x 2 days

## 2017-08-28 NOTE — Discharge Instructions (Signed)
Zofran given in office.  Tolerated well Get rest and drink fluids Zofran prescribed.  Take as directed.    DIET Instructions: 30 minutes after taking nausea medicine, begin with sips of clear liquids. If able to hold down 2 - 4 ounces for 30 minutes, begin drinking more. Increase your fluid intake to replace losses. Clear liquids only for 24 hours (water, tea, sport drinks, clear flat ginger ale or cola and juices, broth, jello, popsicles, ect). Advance to bland foods, applesauce, rice, baked or boiled chicken, ect. Avoid milk, greasy foods and anything that doesnt agree with you.  If you experience new or worsening symptoms return or go to ER If vomiting persists and you are unable to hold fluids or diarrhea persists more than 4 days, or becomes bloody, or if you develop high fever or abdominal pain, return here, see your doctor or go to the ER.

## 2017-08-28 NOTE — ED Provider Notes (Signed)
[facility]   [id number] 08/28/17 Arrival Time: 1427  CC: Vomiting  SUBJECTIVE:  Jordan Hanson is a 22 y.o. female who presents with complaint of nonbloody nonbilious emesis and diarrhea x 3 days.  Denies a precipitating event, or close contacts with similar symptoms. Denies eating anything unusual.  Reports vomiting x 8-10 episodes over the course of 3 days. Also mentions loose stools x3 episodes per day for the past 3 days.  Has not tried OTC medications.  Reports worsening symptoms with eating.  Is keeping liquids down. denies similar symptoms in the past.  Last BM this morning.  Complains of associated chills, nausea, and fatigue  Denies fever, appetite changes, weight changes, chest pain, SOB, constipation, hematochezia, melena, dysuria, difficulty urinating, increased frequency or urgency, flank pain, loss of bowel or bladder function.  No LMP recorded. Denies possibility of pregnancy. Takes depo shot.    ROS: As per HPI.  Past Medical History:  Diagnosis Date  . ADHD (attention deficit hyperactivity disorder)   . Bipolar disorder (HCC)   . Depression    Past Surgical History:  Procedure Laterality Date  . TONSILLECTOMY     Allergies  Allergen Reactions  . Geodon [Ziprasidone Hcl]   . Penicillins Swelling and Rash    Has patient had a PCN reaction causing immediate rash, facial/tongue/throat swelling, SOB or lightheadedness with hypotension:YES Has patient had a PCN reaction causing severe rash involving mucus membranes or skin necrosis: NO Has patient had a PCN reaction that required hospitalization NO Has patient had a PCN reaction occurring within the last 10 years: NO If all of the above answers are "NO", then may proceed with Cephalosporin use.    No current facility-administered medications on file prior to encounter.    Current Outpatient Medications on File Prior to Encounter  Medication Sig Dispense Refill  . acetaminophen (TYLENOL) 500 MG tablet  Take 1 tablet (500 mg total) by mouth every 6 (six) hours as needed for headache. (Patient not taking: Reported on 08/28/2017) 30 tablet 0  . bacitracin ointment Apply 1 application topically 2 (two) times daily. 15 g 0  . benzonatate (TESSALON) 100 MG capsule Take 1-2 capsules (100-200 mg total) by mouth 3 (three) times daily as needed for cough. (Patient not taking: Reported on 08/28/2017) 60 capsule 0  . cyclobenzaprine (FLEXERIL) 10 MG tablet Take 1 tablet (10 mg total) by mouth 3 (three) times daily as needed for muscle spasms. (Patient not taking: Reported on 08/28/2017) 10 tablet 0  . dicyclomine (BENTYL) 20 MG tablet Take 1 tablet (20 mg total) by mouth 2 (two) times daily. (Patient not taking: Reported on 08/28/2017) 60 tablet 0  . diphenhydrAMINE (BENADRYL) 25 mg capsule Take 1 capsule (25 mg total) by mouth every 6 (six) hours as needed. 30 capsule 0  . divalproex (DEPAKOTE ER) 500 MG 24 hr tablet Take 2 tablets (1,000 mg total) by mouth at bedtime. 60 tablet 0  . hydrOXYzine (ATARAX/VISTARIL) 25 MG tablet Take 1 tablet (25 mg total) by mouth every 6 (six) hours as needed for anxiety. 30 tablet 0  . hydrOXYzine (ATARAX/VISTARIL) 25 MG tablet Take 1 tablet (25 mg total) by mouth every 6 (six) hours. 16 tablet 0  . naproxen (NAPROSYN) 375 MG tablet Take 1 tablet (375 mg total) by mouth 2 (two) times daily with a meal. 30 tablet 0  . nicotine polacrilex (NICORETTE) 2 MG gum Take 1 each (2 mg total) by mouth as needed for smoking cessation. 100 tablet  0  . promethazine (PHENERGAN) 25 MG tablet Take 1 tablet (25 mg total) by mouth every 6 (six) hours as needed for nausea or vomiting. 12 tablet 0  . pseudoephedrine (SUDAFED 12 HOUR) 120 MG 12 hr tablet Take 1 tablet (120 mg total) by mouth 2 (two) times daily. 30 tablet 3  . QUEtiapine (SEROQUEL) 300 MG tablet Take 1 tablet (300 mg total) by mouth at bedtime. 30 tablet 0  . sertraline (ZOLOFT) 50 MG tablet Take 3 tablets (150 mg total) by mouth daily.  90 tablet 0  . traZODone (DESYREL) 50 MG tablet Take 1 tablet (50 mg total) by mouth at bedtime as needed for sleep. (Patient not taking: Reported on 08/28/2017) 30 tablet 0   Social History   Socioeconomic History  . Marital status: Single    Spouse name: Not on file  . Number of children: Not on file  . Years of education: Not on file  . Highest education level: Not on file  Occupational History  . Not on file  Social Needs  . Financial resource strain: Not on file  . Food insecurity:    Worry: Not on file    Inability: Not on file  . Transportation needs:    Medical: Not on file    Non-medical: Not on file  Tobacco Use  . Smoking status: Current Every Day Smoker    Packs/day: 0.50    Types: Cigarettes  . Smokeless tobacco: Never Used  Substance and Sexual Activity  . Alcohol use: No    Comment: occ  . Drug use: No    Comment: smokes weed every day  . Sexual activity: Not on file  Lifestyle  . Physical activity:    Days per week: Not on file    Minutes per session: Not on file  . Stress: Not on file  Relationships  . Social connections:    Talks on phone: Not on file    Gets together: Not on file    Attends religious service: Not on file    Active member of club or organization: Not on file    Attends meetings of clubs or organizations: Not on file    Relationship status: Not on file  . Intimate partner violence:    Fear of current or ex partner: Not on file    Emotionally abused: Not on file    Physically abused: Not on file    Forced sexual activity: Not on file  Other Topics Concern  . Not on file  Social History Narrative  . Not on file   History reviewed. No pertinent family history.   OBJECTIVE:  Vitals:   08/28/17 1443 08/28/17 1444 08/28/17 1445  BP:   112/75  Pulse: 84    Resp: 16    Temp: 98.1 F (36.7 C)    SpO2: 100%  100%  Weight:  160 lb (72.6 kg)     General appearance: AOx3 in no acute distress; nontoxic appearance HEENT: NCAT.   PERRL.  Oropharynx clear. Dry mucous membranes Lungs: clear to auscultation bilaterally without adventitious breath sounds Heart: regular rate and rhythm.  Abdomen: soft, non-distended; normal active bowel sounds; non-tender to light and deep palpation; nontender at McBurney's point; negative Murphy's sign; negative rebound; no guarding Back: no CVA tenderness Extremities: no edema; symmetrical with no gross deformities Skin: warm and dry Neurologic: normal gait Psychological: alert and cooperative; normal mood and affect   ASSESSMENT & PLAN:  1. Nausea vomiting and diarrhea  2. Gastroenteritis     Meds ordered this encounter  Medications  . ondansetron (ZOFRAN-ODT) disintegrating tablet 4 mg   Zofran given in office.  Tolerated well Get rest and drink fluids Zofran prescribed.  Take as directed.    DIET Instructions: 30 minutes after taking nausea medicine, begin with sips of clear liquids. If able to hold down 2 - 4 ounces for 30 minutes, begin drinking more. Increase your fluid intake to replace losses. Clear liquids only for 24 hours (water, tea, sport drinks, clear flat ginger ale or cola and juices, broth, jello, popsicles, ect). Advance to bland foods, applesauce, rice, baked or boiled chicken, ect. Avoid milk, greasy foods and anything that doesn't agree with you.  If you experience new or worsening symptoms return or go to ER If vomiting persists and you are unable to hold fluids or diarrhea persists more than 4 days, or becomes bloody, or if you develop high fever or abdominal pain, return here, see your doctor or go to the ER.    Reviewed expectations re: course of current medical issues. Questions answered. Outlined signs and symptoms indicating need for more acute intervention. Patient verbalized understanding. After Visit Summary given.   Rennis HardingWurst, Mohmmad Saleeby, PA-C 08/28/17 1527

## 2017-09-07 ENCOUNTER — Other Ambulatory Visit: Payer: Self-pay

## 2017-09-07 ENCOUNTER — Encounter (HOSPITAL_COMMUNITY): Payer: Self-pay

## 2017-09-07 ENCOUNTER — Emergency Department (HOSPITAL_COMMUNITY)
Admission: EM | Admit: 2017-09-07 | Discharge: 2017-09-07 | Disposition: A | Discharge status: Home / Self Care | Payer: Medicaid Other | Attending: Emergency Medicine | Admitting: Emergency Medicine

## 2017-09-07 DIAGNOSIS — F17210 Nicotine dependence, cigarettes, uncomplicated: Secondary | ICD-10-CM | POA: Diagnosis not present

## 2017-09-07 DIAGNOSIS — O99330 Smoking (tobacco) complicating pregnancy, unspecified trimester: Secondary | ICD-10-CM | POA: Diagnosis not present

## 2017-09-07 DIAGNOSIS — O219 Vomiting of pregnancy, unspecified: Secondary | ICD-10-CM | POA: Diagnosis present

## 2017-09-07 DIAGNOSIS — Z3A00 Weeks of gestation of pregnancy not specified: Secondary | ICD-10-CM | POA: Insufficient documentation

## 2017-09-07 LAB — URINALYSIS, ROUTINE W REFLEX MICROSCOPIC
BACTERIA UA: NONE SEEN
BILIRUBIN URINE: NEGATIVE
Glucose, UA: NEGATIVE mg/dL
Ketones, ur: 80 mg/dL — AB
Nitrite: NEGATIVE
PROTEIN: 30 mg/dL — AB
Specific Gravity, Urine: 1.029 (ref 1.005–1.030)
pH: 5 (ref 5.0–8.0)

## 2017-09-07 LAB — COMPREHENSIVE METABOLIC PANEL
ALK PHOS: 45 U/L (ref 38–126)
ALT: 56 U/L — AB (ref 0–44)
AST: 24 U/L (ref 15–41)
Albumin: 4.5 g/dL (ref 3.5–5.0)
Anion gap: 11 (ref 5–15)
BUN: 11 mg/dL (ref 6–20)
CO2: 23 mmol/L (ref 22–32)
CREATININE: 0.46 mg/dL (ref 0.44–1.00)
Calcium: 10 mg/dL (ref 8.9–10.3)
Chloride: 105 mmol/L (ref 98–111)
Glucose, Bld: 85 mg/dL (ref 70–99)
Potassium: 3.3 mmol/L — ABNORMAL LOW (ref 3.5–5.1)
Sodium: 139 mmol/L (ref 135–145)
Total Bilirubin: 0.9 mg/dL (ref 0.3–1.2)
Total Protein: 7.5 g/dL (ref 6.5–8.1)

## 2017-09-07 LAB — CBC
HCT: 36.7 % (ref 36.0–46.0)
Hemoglobin: 12.7 g/dL (ref 12.0–15.0)
MCH: 29.1 pg (ref 26.0–34.0)
MCHC: 34.6 g/dL (ref 30.0–36.0)
MCV: 84.2 fL (ref 78.0–100.0)
Platelets: 343 10*3/uL (ref 150–400)
RBC: 4.36 MIL/uL (ref 3.87–5.11)
RDW: 12 % (ref 11.5–15.5)
WBC: 9.5 10*3/uL (ref 4.0–10.5)

## 2017-09-07 LAB — LIPASE, BLOOD: Lipase: 30 U/L (ref 11–51)

## 2017-09-07 LAB — CBG MONITORING, ED: Glucose-Capillary: 129 mg/dL — ABNORMAL HIGH (ref 70–99)

## 2017-09-07 LAB — I-STAT BETA HCG BLOOD, ED (MC, WL, AP ONLY)

## 2017-09-07 MED ORDER — ONDANSETRON 4 MG PO TBDP
4.0000 mg | ORAL_TABLET | Freq: Once | ORAL | Status: AC | PRN
  Administered 2017-09-07: 4 mg via ORAL
  Filled 2017-09-07: qty 1

## 2017-09-07 MED ORDER — DOXYLAMINE-PYRIDOXINE 10-10 MG PO TBEC: 1.0000 | DELAYED_RELEASE_TABLET | Freq: Three times a day (TID) | ORAL | 0 refills | Status: DC | PRN

## 2017-09-07 MED ORDER — SODIUM CHLORIDE 0.9 % IV BOLUS
1000.0000 mL | Freq: Once | INTRAVENOUS | Status: AC
  Administered 2017-09-07: 1000 mL via INTRAVENOUS

## 2017-09-07 MED ORDER — PROMETHAZINE HCL 25 MG/ML IJ SOLN
12.5000 mg | Freq: Once | INTRAMUSCULAR | Status: AC
  Administered 2017-09-07: 12.5 mg via INTRAVENOUS
  Filled 2017-09-07: qty 1

## 2017-09-07 NOTE — ED Notes (Signed)
Pt roomed-changing into gown

## 2017-09-07 NOTE — ED Triage Notes (Signed)
Pt BIBA from home. Pt c/o abd pain since Monday. Pt has also had N/V. Pt states she went to urgent care on Wednesday, and has not been able to eat since. Pt was told it was a stomach bug, and to come to ED if it got worse. Pt FSBG en route was 68, given oral glucose, then 94 after.

## 2017-09-07 NOTE — Discharge Instructions (Signed)
You have been seen in the Emergency Department (ED) today for nausea and vomiting.  Your work up today has not shown a clear cause for your symptoms.  You have been prescribed Diclegis; please use as prescribed as needed for your nausea. This is a combination of two over the counter nausea medications. If your insurance does not cover this, the pharmacy can help you get the correct over the counter medications.  [facility] to [facility] up a new patient appointment.  Follow up with your doctor as soon as possible regarding todays emergent visit and your symptoms of nausea.   Return to the ED if you develop abdominal, bloody vomiting, bloody diarrhea, if you are unable to tolerate fluids due to vomiting, or if you develop other symptoms that concern you.

## 2017-09-07 NOTE — ED Provider Notes (Signed)
Emergency Department Provider Note   I have reviewed the triage vital signs and the nursing notes.   HISTORY  Chief Complaint Abdominal Pain   HPI Jordan Hanson is a 22 y.o. female with PMH of ADHD, Bipolar, and Depression resents to the emergency department for evaluation of nausea, vomiting, diarrhea.  Symptoms have been ongoing for the past 10 days.  Has cramping epigastric abdominal pain with vomiting only.  She denies any abdominal pain without emesis.  No chest pain or shortness of breath.  No fevers.  She presents today because she is unable to keep any food or water down.  She denies any alcohol or drug use.  She specifically denies marijuana use.  No history of prior vomiting episodes.  No surgical history.   Past Medical History:  Diagnosis Date  . ADHD (attention deficit hyperactivity disorder)   . Bipolar disorder (HCC)   . Depression     Patient Active Problem List   Diagnosis Date Noted  . Borderline personality disorder (HCC) 02/13/2016  . Bipolar 1 disorder, depressed (HCC) 02/10/2016  . Suicidal ideation 07/19/2014  . 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 MDD (major depressive disorder) 06/17/2014    Past Surgical History:  Procedure Laterality Date  . TONSILLECTOMY      Allergies Geodon [ziprasidone hcl] and Penicillins  No family history on file.  Social History Social History   Tobacco Use  . Smoking status: Current Every Day Smoker    Packs/day: 0.50    Types: Cigarettes  . Smokeless tobacco: Never Used  Substance Use Topics  . Alcohol use: No    Comment: occ  . Drug use: No    Comment: smokes weed every day    Review of Systems  Constitutional: No fever/chills Eyes: No visual changes. ENT: No sore throat. Cardiovascular: Denies chest pain. Respiratory: Denies shortness of breath. Gastrointestinal: Positive abdominal pain with vomiting. Positive nausea, vomiting, and diarrhea.   No constipation. Genitourinary: Negative for dysuria. Musculoskeletal: Negative for back pain. Skin: Negative for rash. Neurological: Negative for headaches, focal weakness or numbness.  10-point ROS otherwise negative.  ____________________________________________   PHYSICAL EXAM:  VITAL SIGNS: ED Triage Vitals  Enc Vitals Group     BP 09/07/17 1735 129/79     Pulse Rate 09/07/17 1735 (!) 101     Resp 09/07/17 1735 17     Temp 09/07/17 1735 98.6 F (37 C)     Temp Source 09/07/17 1735 Oral     SpO2 09/07/17 1734 99 %     Weight 09/07/17 1737 160 lb (72.6 kg)     Height 09/07/17 1737 5\' 4"  (1.626 m)     Pain Score 09/07/17 1737 8   Constitutional: Alert and oriented. Well appearing and in no acute distress. Eyes: Conjunctivae are normal.  Head: Atraumatic. Nose: No congestion/rhinnorhea. Mouth/Throat: Mucous membranes are slightly dry.   Neck: No stridor.   Cardiovascular: Tachycardia. Good peripheral circulation. Grossly normal heart sounds.   Respiratory: Normal respiratory effort.  No retractions. Lungs CTAB. Gastrointestinal: Soft and nontender. No distention.  Musculoskeletal: No lower extremity tenderness nor edema. No gross deformities of extremities. Neurologic:  Normal speech and language. No gross focal neurologic deficits are appreciated.  Skin:  Skin is warm, dry and intact. No rash noted.  ____________________________________________   LABS (all labs ordered are listed, but only abnormal results are displayed)  Labs Reviewed  COMPREHENSIVE METABOLIC PANEL - Abnormal; Notable for the following components:  Result Value   Potassium 3.3 (*)    ALT 56 (*)    All other components within normal limits  URINALYSIS, ROUTINE W REFLEX MICROSCOPIC - Abnormal; Notable for the following components:   Color, Urine AMBER (*)    APPearance HAZY (*)    Hgb urine dipstick SMALL (*)    Ketones, ur 80 (*)    Protein, ur 30 (*)    Leukocytes, UA SMALL (*)    All  other components within normal limits  I-STAT BETA HCG BLOOD, ED ([facility], [facility], [facility] ONLY) - Abnormal; Notable for the following components:   I-stat hCG, quantitative >2,000.0 (*)    All other components within normal limits  CBG MONITORING, ED - Abnormal; Notable for the following components:   Glucose-Capillary 129 (*)    All other components within normal limits  LIPASE, BLOOD  CBC   ____________________________________________  RADIOLOGY  None ____________________________________________   PROCEDURES  Procedure(s) performed:   Procedures  EMERGENCY DEPARTMENT [place] PREGNANCY "Study: Limited Ultrasound of the Pelvis for Pregnancy"  INDICATIONS:Pregnancy(required) and Vomiting  Multiple views of the uterus and pelvic cavity were obtained in real-time with a multi-frequency probe.  APPROACH:Transabdominal   PERFORMED BY: Myself  LIMITATIONS: none  PREGNANCY FREE FLUID: None  ADNEXAL FINDINGS:Left ovary not seen and Right ovary not seen  PREGNANCY FINDINGS: Intrauterine gestational sac noted, Fetal pole present and Fetal heart activity seen  INTERPRETATION: Fetal pole present and Fetal heart activity seen  CPT Codes:  [phone] (transabdominal OB)  [phone] (transvaginal OB, Reduced level of service for incomplete exam)    ____________________________________________   INITIAL IMPRESSION / ASSESSMENT AND PLAN / ED COURSE  Pertinent labs & imaging results that were available during my care of the patient were reviewed by me and considered in my medical decision making (see chart for details).  Patient presents to the emergency department with 10 days of nausea, vomiting, diarrhea.  She has been using Zofran at home with no relief in symptoms.  No recent travel or antibiotic use.  Her abdomen is diffusely soft and nontender to palpation.  She only has pain with vomiting.  No indication for abdominal imaging at this time.  Plan for IV fluids, Phenergan, repeat labs,  reassess.  08:28 PM Patient i-STAT hCG is greater than 2000.  Labs reviewed with no acute findings.  UA unremarkable.  Patient is not having abdominal pain except for when she is vomiting.  I performed a bedside ultrasound confirmed intrauterine pregnancy with positive fetal heart activity on ultrasound.  Plan to discharge with Diclegis and OB follow up.   At this time, I do not feel there is any life-threatening condition present. I have reviewed and discussed all results (EKG, imaging, lab, urine as appropriate), exam findings with patient. I have reviewed nursing notes and appropriate previous records.  I feel the patient is safe to be discharged home without further emergent workup. Discussed usual and customary return precautions. Patient and family (if present) verbalize understanding and are comfortable with this plan.  Patient will follow-up with their primary care provider. If they do not have a primary care provider, information for follow-up has been provided to them. All questions have been answered.  ____________________________________________  FINAL CLINICAL IMPRESSION(S) / ED DIAGNOSES  Final diagnoses:  Nausea and vomiting in pregnancy     MEDICATIONS GIVEN DURING THIS VISIT:  Medications  ondansetron (ZOFRAN-ODT) disintegrating tablet 4 mg (4 mg Oral Given 09/07/17 1741)  sodium chloride 0.9 % bolus 1,000 mL (0  mLs Intravenous Stopped 09/07/17 2005)  promethazine (PHENERGAN) injection 12.5 mg (12.5 mg Intravenous Given 09/07/17 1859)     NEW OUTPATIENT MEDICATIONS STARTED DURING THIS VISIT:  Discharge Medication List as of 09/07/2017  8:26 PM    START taking these medications   Details  Doxylamine-Pyridoxine 10-10 MG TBEC Take 1 tablet by mouth every 8 (eight) hours as needed., Starting Sat 09/07/2017, Print        Note:  This document was prepared using Dragon voice recognition software and may include unintentional dictation errors.  Alona Bene, MD Emergency  Medicine    Bridgett Hattabaugh, Arlyss Repress, MD 09/08/17 [phone]

## 2017-11-19 ENCOUNTER — Encounter (HOSPITAL_COMMUNITY): Payer: Self-pay | Admitting: Emergency Medicine

## 2017-11-19 ENCOUNTER — Ambulatory Visit (HOSPITAL_COMMUNITY)
Admission: EM | Admit: 2017-11-19 | Discharge: 2017-11-19 | Disposition: A | Discharge status: Home / Self Care | Payer: Medicaid Other | Attending: Family Medicine | Admitting: Family Medicine

## 2017-11-19 DIAGNOSIS — Z880 Allergy status to penicillin: Secondary | ICD-10-CM | POA: Diagnosis not present

## 2017-11-19 DIAGNOSIS — F17210 Nicotine dependence, cigarettes, uncomplicated: Secondary | ICD-10-CM | POA: Diagnosis not present

## 2017-11-19 DIAGNOSIS — Z79899 Other long term (current) drug therapy: Secondary | ICD-10-CM | POA: Diagnosis not present

## 2017-11-19 DIAGNOSIS — F319 Bipolar disorder, unspecified: Secondary | ICD-10-CM | POA: Insufficient documentation

## 2017-11-19 DIAGNOSIS — N939 Abnormal uterine and vaginal bleeding, unspecified: Secondary | ICD-10-CM | POA: Diagnosis not present

## 2017-11-19 DIAGNOSIS — R109 Unspecified abdominal pain: Secondary | ICD-10-CM | POA: Diagnosis present

## 2017-11-19 DIAGNOSIS — R1030 Lower abdominal pain, unspecified: Secondary | ICD-10-CM | POA: Diagnosis not present

## 2017-11-19 DIAGNOSIS — F909 Attention-deficit hyperactivity disorder, unspecified type: Secondary | ICD-10-CM | POA: Insufficient documentation

## 2017-11-19 DIAGNOSIS — Z888 Allergy status to other drugs, medicaments and biological substances status: Secondary | ICD-10-CM | POA: Insufficient documentation

## 2017-11-19 DIAGNOSIS — F603 Borderline personality disorder: Secondary | ICD-10-CM | POA: Insufficient documentation

## 2017-11-19 DIAGNOSIS — Z3202 Encounter for pregnancy test, result negative: Secondary | ICD-10-CM | POA: Diagnosis not present

## 2017-11-19 LAB — POCT URINALYSIS DIP (DEVICE)
GLUCOSE, UA: NEGATIVE mg/dL
KETONES UR: NEGATIVE mg/dL
LEUKOCYTES UA: NEGATIVE
Nitrite: NEGATIVE
PROTEIN: NEGATIVE mg/dL
Specific Gravity, Urine: >=1.03 (ref 1.005–1.030)
UROBILINOGEN UA: 0.2 mg/dL (ref 0.0–1.0)
pH: 5.5 (ref 5.0–8.0)

## 2017-11-19 LAB — POCT I-STAT, CHEM 8
BUN: 9 mg/dL (ref 6–20)
CALCIUM ION: 1.26 mmol/L (ref 1.15–1.40)
Chloride: 107 mmol/L (ref 98–111)
Creatinine, Ser: 0.5 mg/dL (ref 0.44–1.00)
GLUCOSE: 100 mg/dL — AB (ref 70–99)
HCT: 38 % (ref 36.0–46.0)
HEMOGLOBIN: 12.9 g/dL (ref 12.0–15.0)
Potassium: 3.8 mmol/L (ref 3.5–5.1)
Sodium: 140 mmol/L (ref 135–145)
TCO2: 22 mmol/L (ref 22–32)

## 2017-11-19 LAB — HCG, QUANTITATIVE, PREGNANCY: HCG, BETA CHAIN, QUANT, S: 2 m[IU]/mL (ref <5)

## 2017-11-19 LAB — POCT PREGNANCY, URINE: PREG TEST UR: NEGATIVE

## 2017-11-19 MED ORDER — MEGESTROL ACETATE 40 MG PO TABS: 40.0000 mg | ORAL_TABLET | Freq: Every day | ORAL | 0 refills | Status: DC

## 2017-11-19 NOTE — ED Triage Notes (Signed)
Pt here with abd cramping and vaginal bleeding; pt sts had miscarriage in September; pt sts bleeding x 3 days

## 2017-11-19 NOTE — ED Provider Notes (Signed)
[facility]    CSN: [id number] Arrival date & time: 11/19/17  1007     History   Chief Complaint Chief Complaint  Patient presents with  . Abdominal Pain    HPI ZHANIYA SWALLOWS is a 22 y.o. female.   Pt is a 22 year old female that presents  for irregular bleeding and lower abdominal  cramping. She has been having irregular menstrual cycles since she had a miscarriage back in October and had to have a D&C. Reports that her periods are heavy and she goes through 4 super tampons in 2 hours at times. She had dark bleeding and large clots 3 days ago and now is having cramping and continues to bleed. She is currently sexually active with one partner and uses the pull out method for birth control. She is here for concerns of another miscarriage. She denies any fever, chills, body aches, dysuria, hematuria.   ROS per HPI      Past Medical History:  Diagnosis Date  . ADHD (attention deficit hyperactivity disorder)   . Bipolar disorder (HCC)   . Depression     Patient Active Problem List   Diagnosis Date Noted  . Borderline personality disorder (HCC) 02/13/2016  . Bipolar 1 disorder, depressed (HCC) 02/10/2016  . Suicidal ideation 07/19/2014  . 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 MDD (major depressive disorder) 06/17/2014    Past Surgical History:  Procedure Laterality Date  . TONSILLECTOMY      OB History   None      Home Medications    Prior to Admission medications   Medication Sig Start Date End Date Taking? Authorizing Provider  bacitracin ointment Apply 1 application topically 2 (two) times daily. Patient not taking: Reported on 09/07/2017 01/29/17   Everlene Farrier, PA-C  diphenhydrAMINE (BENADRYL) 25 mg capsule Take 1 capsule (25 mg total) by mouth every 6 (six) hours as needed. Patient not taking: Reported on 09/07/2017 10/29/16   Kellie Shropshire, PA-C  divalproex (DEPAKOTE ER) 500 MG 24 hr  tablet Take 2 tablets (1,000 mg total) by mouth at bedtime. Patient taking differently: Take 500 mg by mouth 2 (two) times daily.  02/13/16   Adonis Brook, NP  Doxylamine-Pyridoxine 10-10 MG TBEC Take 1 tablet by mouth every 8 (eight) hours as needed. 09/07/17   Long, Arlyss Repress, MD  hydrOXYzine (ATARAX/VISTARIL) 25 MG tablet Take 1 tablet (25 mg total) by mouth every 6 (six) hours as needed for anxiety. Patient not taking: Reported on 09/07/2017 02/13/16   Adonis Brook, NP  hydrOXYzine (ATARAX/VISTARIL) 25 MG tablet Take 1 tablet (25 mg total) by mouth every 6 (six) hours. Patient not taking: Reported on 09/07/2017 10/29/16   Kellie Shropshire, PA-C  megestrol (MEGACE) 40 MG tablet Take 1 tablet (40 mg total) by mouth daily. 11/19/17   Dahlia Byes A, NP  naproxen (NAPROSYN) 375 MG tablet Take 1 tablet (375 mg total) by mouth 2 (two) times daily with a meal. Patient not taking: Reported on 09/07/2017 01/29/17   Everlene Farrier, PA-C  nicotine polacrilex (NICORETTE) 2 MG gum Take 1 each (2 mg total) by mouth as needed for smoking cessation. Patient not taking: Reported on 09/07/2017 02/13/16   Adonis Brook, NP  ondansetron (ZOFRAN) 4 MG tablet Take 1 tablet (4 mg total) by mouth every 6 (six) hours as needed for nausea or vomiting. Patient not taking: Reported on 09/07/2017 08/28/17   Wurst, [place], PA-C  promethazine [facility])  25 MG tablet Take 1 tablet (25 mg total) by mouth every 6 (six) hours as needed for nausea or vomiting. Patient not taking: Reported on 09/07/2017 04/03/17   Azalia Bilis, MD  pseudoephedrine (SUDAFED 12 HOUR) 120 MG 12 hr tablet Take 1 tablet (120 mg total) by mouth 2 (two) times daily. Patient not taking: Reported on 09/07/2017 12/20/16   Wallis Bamberg, PA-C  QUEtiapine (SEROQUEL) 300 MG tablet Take 1 tablet (300 mg total) by mouth at bedtime. 02/13/16   Adonis Brook, NP  ranitidine (ZANTAC) 150 MG capsule Take 150 mg by mouth 2 (two) times daily as needed for heartburn.     [provider]  sertraline (ZOLOFT) 50 MG tablet Take 3 tablets (150 mg total) by mouth daily. Patient taking differently: Take 100 mg by mouth daily.  02/14/16   Adonis Brook, NP    Family History History reviewed. No pertinent family history.  Social History Social History   Tobacco Use  . Smoking status: Current Every Day Smoker    Packs/day: 0.50    Types: Cigarettes  . Smokeless tobacco: Never Used  Substance Use Topics  . Alcohol use: No    Comment: occ  . Drug use: No    Comment: smokes weed every day     Allergies   Geodon [ziprasidone hcl] and Penicillins   Review of Systems Review of Systems   Physical Exam Triage Vital Signs ED Triage Vitals  Enc Vitals Group     BP 11/19/17 1029 134/87     Pulse Rate 11/19/17 1029 (!) 105     Resp 11/19/17 1029 20     Temp 11/19/17 1029 98.2 F (36.8 C)     Temp Source 11/19/17 1029 Oral     SpO2 11/19/17 1029 100 %     Weight --      Height --      Head Circumference --      Peak Flow --      Pain Score 11/19/17 1027 8     Pain Loc --      Pain Edu? --      Excl. in GC? --    No data found.  Updated Vital Signs BP 134/87 (BP Location: Left Arm)   Pulse (!) 105   Temp 98.2 F (36.8 C) (Oral)   Resp 20   SpO2 100%   Visual Acuity Right Eye Distance:   Left Eye Distance:   Bilateral Distance:    Right Eye Near:   Left Eye Near:    Bilateral Near:     Physical Exam  Constitutional: She appears well-developed and well-nourished.  Very pleasant. Non toxic or ill appearing.   HENT:  Head: Normocephalic and atraumatic.  Pulmonary/Chest: Effort normal.  Abdominal: Soft. Normal appearance and bowel sounds are normal. There is generalized tenderness. There is no CVA tenderness.  Genitourinary: Vagina normal and uterus normal. Uterus is not enlarged and not tender. Cervix exhibits no motion tenderness, no discharge and no friability. Right adnexum displays no mass, no tenderness and no  fullness. Left adnexum displays no mass, no tenderness and no fullness.  Genitourinary Comments: External vaginal area appears normal with some bleeding at the vaginal opening.No lesions, discharge or rash.  Cervical os normal without lesions.  Bleeding noted.  No obvious discharge.  Nontender during insertion of speculum. No masses, tenderness or fullness on internal exam.  No cervical motion tenderness.   Neurological: She is alert.  Skin: Skin is warm and dry.  Psychiatric: She has a normal mood and affect.  Nursing note and vitals reviewed.    UC Treatments / Results  Labs (all labs ordered are listed, but only abnormal results are displayed) Labs Reviewed  POCT URINALYSIS DIP (DEVICE) - Abnormal; Notable for the following components:      Result Value   Bilirubin Urine SMALL (*)    Hgb urine dipstick MODERATE (*)    All other components within normal limits  POCT I-STAT, CHEM 8 - Abnormal; Notable for the following components:   Glucose, Bld 100 (*)    All other components within normal limits  HCG, QUANTITATIVE, PREGNANCY  POCT PREGNANCY, URINE    EKG None  Radiology No results found.  Procedures Procedures (including critical care time)  Medications Ordered in UC Medications - No data to display  Initial Impression / Assessment and Plan / UC Course  I have reviewed the triage vital signs and the nursing notes.  Pertinent labs & imaging results that were available during my care of the patient were reviewed by me and considered in my medical decision making (see chart for details).  Clinical Course as of Nov 20 1146  Tue Nov 19, 2017  1132 Specific Gravity, Urine: >=1.030 [TB]    Clinical Course User Index [TB] Janace ArisBast, Adalei Novell A, NP    Patient is a 22 year old female who presents for abdominal cramping and irregular vaginal bleeding. She has been bleeding every other week since her miscarriage.  She reports heavy bleeding with large clots and mild abdominal  cramping. This episode has been for the past 3 days. She is here for concerns of another miscarriage Urine hCG negative here. We are sending off a blood hCG  Patient is not anemic Most likely the irregular bleeding is hormone related post having a miscarriage and her body regulating She could benefit from ultrasound to r/o retained products or other abnormality.  Internal and external vaginal exam was normal No obvious unusual discharge or cervical motion tenderness. Bleeding from cervical os.  We will give her Megace to stop the bleeding and have her follow-up with OB/GYN this week for further management Final Clinical Impressions(s) / UC Diagnoses   Final diagnoses:  Abnormal vaginal bleeding     Discharge Instructions     Urine was negative for pregnancy here Your H CG results are still pending Urine was negative for infection I would like for you to follow-up this week with the women's outpatient clinic We are going to do Megace twice a day to stop bleeding We sent the swab for testing and will call with any positive results    ED Prescriptions    Medication Sig Dispense Auth. Provider   megestrol (MEGACE) 40 MG tablet Take 1 tablet (40 mg total) by mouth daily. 20 tablet Dahlia ByesBast, Eulonda Andalon A, NP     Controlled Substance Prescriptions [place] Controlled Substance Registry consulted? Not Applicable   Janace ArisBast, Malayah Demuro A, NP 11/19/17 1148

## 2017-11-19 NOTE — Discharge Instructions (Addendum)
Urine was negative for pregnancy here Your H CG results are still pending Urine was negative for infection I would like for you to follow-up this week with the women's outpatient clinic We are going to do Megace twice a day to stop bleeding We sent the swab for testing and will call with any positive results

## 2017-11-20 LAB — CERVICOVAGINAL ANCILLARY ONLY
Bacterial vaginitis: POSITIVE — AB
CHLAMYDIA, DNA PROBE: NEGATIVE
Candida vaginitis: NEGATIVE
NEISSERIA GONORRHEA: NEGATIVE
Trichomonas: NEGATIVE

## 2017-11-21 ENCOUNTER — Telehealth (HOSPITAL_COMMUNITY): Payer: Self-pay

## 2017-11-21 MED ORDER — METRONIDAZOLE 500 MG PO TABS: 500.0000 mg | ORAL_TABLET | Freq: Two times a day (BID) | ORAL | 0 refills | Status: DC

## 2017-11-21 NOTE — Telephone Encounter (Signed)
Bacterial vaginosis is positive. This was not treated at the urgent care visit.  Flagyl 500 mg BID x 7 days #14 no refills sent to patients pharmacy of choice.  Patient contacted, denies any concerns. Answered all questions.    

## 2017-12-11 ENCOUNTER — Ambulatory Visit (HOSPITAL_COMMUNITY)
Admission: EM | Admit: 2017-12-11 | Discharge: 2017-12-11 | Disposition: A | Discharge status: Home / Self Care | Payer: Medicaid Other | Attending: Family Medicine | Admitting: Family Medicine

## 2017-12-11 ENCOUNTER — Encounter (HOSPITAL_COMMUNITY): Payer: Self-pay

## 2017-12-11 DIAGNOSIS — R109 Unspecified abdominal pain: Secondary | ICD-10-CM | POA: Diagnosis not present

## 2017-12-11 DIAGNOSIS — N200 Calculus of kidney: Secondary | ICD-10-CM

## 2017-12-11 DIAGNOSIS — R42 Dizziness and giddiness: Secondary | ICD-10-CM

## 2017-12-11 LAB — POCT URINALYSIS DIP (DEVICE)
BILIRUBIN URINE: NEGATIVE
GLUCOSE, UA: NEGATIVE mg/dL
KETONES UR: NEGATIVE mg/dL
LEUKOCYTES UA: NEGATIVE
Nitrite: NEGATIVE
PROTEIN: NEGATIVE mg/dL
Urobilinogen, UA: 0.2 mg/dL (ref 0.0–1.0)
pH: 5.5 (ref 5.0–8.0)

## 2017-12-11 LAB — POCT PREGNANCY, URINE: PREG TEST UR: NEGATIVE

## 2017-12-11 MED ORDER — INDOMETHACIN 50 MG PO CAPS: 50.0000 mg | ORAL_CAPSULE | Freq: Two times a day (BID) | ORAL | 0 refills | Status: DC

## 2017-12-11 NOTE — ED Provider Notes (Signed)
[facility]   [id number] 12/11/17 Arrival Time: 4098  CC: Flank DISCOMFORT  SUBJECTIVE:  Jordan Hanson is a 22 y.o. female who presents with complaint of left flank pain x 2 days.  Denies a precipitating event, trauma, recent heavy lifting or strenuous activity.  Localizes pain to left flank.  Describes as stable, constant and shooting in character.  Has tried OTC medications without relief.  Worse with standing.  Denies similar symptoms in the past.  Complains of associated nausea, and intermittent lightheadedness upon standing from a seated position.    Denies fever, chills, appetite changes, vomiting, chest pain, SOB, diarrhea, constipation, hematochezia, melena, dysuria, difficulty urinating, increased frequency or urgency, flank pain, loss of bowel or bladder function, vaginal discharge, vaginal odor, vaginal bleeding, dyspareunia, pelvic pain.     Patient's last menstrual period was 11/28/2017.  Last unprotected sex 5 days ago.  Did not use protection.  Reports monogamous relationship of 10 months with 1 female partner.  Not concern for STIs at this time.   Current BC: None  ROS: As per HPI.  Past Medical History:  Diagnosis Date  . ADHD (attention deficit hyperactivity disorder)   . Bipolar disorder (HCC)   . Depression    Past Surgical History:  Procedure Laterality Date  . TONSILLECTOMY     Allergies  Allergen Reactions  . Geodon [Ziprasidone Hcl]   . Penicillins Swelling and Rash    Has patient had a PCN reaction causing immediate rash, facial/tongue/throat swelling, SOB or lightheadedness with hypotension:YES Has patient had a PCN reaction causing severe rash involving mucus membranes or skin necrosis: NO Has patient had a PCN reaction that required hospitalization NO Has patient had a PCN reaction occurring within the last 10 years: NO If all of the above answers are "NO", then may proceed with Cephalosporin use.    No current facility-administered  medications on file prior to encounter.    Current Outpatient Medications on File Prior to Encounter  Medication Sig Dispense Refill  . divalproex (DEPAKOTE ER) 500 MG 24 hr tablet Take 2 tablets (1,000 mg total) by mouth at bedtime. (Patient taking differently: Take 500 mg by mouth 2 (two) times daily. ) 60 tablet 0  . Doxylamine-Pyridoxine 10-10 MG TBEC Take 1 tablet by mouth every 8 (eight) hours as needed. 15 tablet 0  . megestrol (MEGACE) 40 MG tablet Take 1 tablet (40 mg total) by mouth daily. 20 tablet 0  . metroNIDAZOLE (FLAGYL) 500 MG tablet Take 1 tablet (500 mg total) by mouth 2 (two) times daily. 14 tablet 0  . QUEtiapine (SEROQUEL) 300 MG tablet Take 1 tablet (300 mg total) by mouth at bedtime. 30 tablet 0  . ranitidine (ZANTAC) 150 MG capsule Take 150 mg by mouth 2 (two) times daily as needed for heartburn.    . sertraline (ZOLOFT) 50 MG tablet Take 3 tablets (150 mg total) by mouth daily. (Patient taking differently: Take 100 mg by mouth daily. ) 90 tablet 0   Social History   Socioeconomic History  . Marital status: Single    Spouse name: Not on file  . Number of children: Not on file  . Years of education: Not on file  . Highest education level: Not on file  Occupational History  . Not on file  Social Needs  . Financial resource strain: Not on file  . Food insecurity:    Worry: Not on file    Inability: Not on file  . Transportation needs:  Medical: Not on file    Non-medical: Not on file  Tobacco Use  . Smoking status: Current Every Day Smoker    Packs/day: 0.50    Types: Cigarettes  . Smokeless tobacco: Never Used  Substance and Sexual Activity  . Alcohol use: No    Comment: occ  . Drug use: No    Comment: smokes weed every day  . Sexual activity: Not on file  Lifestyle  . Physical activity:    Days per week: Not on file    Minutes per session: Not on file  . Stress: Not on file  Relationships  . Social connections:    Talks on phone: Not on file      Gets together: Not on file    Attends religious service: Not on file    Active member of club or organization: Not on file    Attends meetings of clubs or organizations: Not on file    Relationship status: Not on file  . Intimate partner violence:    Fear of current or ex partner: Not on file    Emotionally abused: Not on file    Physically abused: Not on file    Forced sexual activity: Not on file  Other Topics Concern  . Not on file  Social History Narrative  . Not on file   History reviewed. No pertinent family history.   OBJECTIVE:  Vitals:   12/11/17 1033  BP: 119/78  Pulse: 94  Resp: 20  Temp: 98.2 F (36.8 C)  TempSrc: Oral  SpO2: 100%    General appearance: Alert; NAD HEENT: NCAT.  Oropharynx clear.  Lungs: clear to auscultation bilaterally without adventitious breath sounds Heart: regular rate and rhythm.  Radial pulses 2+ symmetrical bilaterally Abdomen: soft, non-distended; normal active bowel sounds; tender to palpation over the left flank; nontender at McBurney's point; no guarding GU: Deferred Back: no CVA tenderness; no midline tenderness or tenderness along the paravertebral muscles Extremities: no edema; symmetrical with no gross deformities Skin: warm and dry Neurologic: normal gait Psychological: alert and cooperative; normal mood and affect  LABS: Results for orders placed or performed during the hospital encounter of 12/11/17 (from the past 24 hour(s))  POCT urinalysis dip (device)     Status: Abnormal   Collection Time: 12/11/17 10:52 AM  Result Value Ref Range   Glucose, UA NEGATIVE NEGATIVE mg/dL   Bilirubin Urine NEGATIVE NEGATIVE   Ketones, ur NEGATIVE NEGATIVE mg/dL   Specific Gravity, Urine >=1.030 1.005 - 1.030   Hgb urine dipstick TRACE (A) NEGATIVE   pH 5.5 5.0 - 8.0   Protein, ur NEGATIVE NEGATIVE mg/dL   Urobilinogen, UA 0.2 0.0 - 1.0 mg/dL   Nitrite NEGATIVE NEGATIVE   Leukocytes, UA NEGATIVE NEGATIVE  Pregnancy, urine POC      Status: None   Collection Time: 12/11/17 10:58 AM  Result Value Ref Range   Preg Test, Ur NEGATIVE NEGATIVE   ASSESSMENT & PLAN:  1. Left flank pain   2. Kidney stone   3. Orthostatic lightheadedness     Meds ordered this encounter  Medications  . indomethacin (INDOCIN) 50 MG capsule    Sig: Take 1 capsule (50 mg total) by mouth 2 (two) times daily with a meal.    Dispense:  20 capsule    Refill:  0    Order Specific Question:   Supervising Provider    Answer:   Eustace Moore [[id number]]   Vital signs did not show signs of orthostatic  hypotension.  Still recommending increasing fluids, drink at least half your body weight in ounces.  Eat a well-balanced diet of lean fruits and vegetables.  If lightheadedness persists please [facility].    Urine pregnancy negative Urine did show signs of trace blood.  This may be an indication of kidney stone Strain all urine and follow up with PCP with specimen Prescribed indomethacin.  Take as directed for pain and to help facilitate the stone to pass.  PCP assistance initiated   Follow up with PCP or [facility] if symptoms persist Return or go to ER if you have any new or worsening symptoms (difficulty urinating, blood in urine, pain that does not moderate with medication, fever, chills, abdominal pain, etc...)  Reviewed expectations re: course of current medical issues. Questions answered. Outlined signs and symptoms indicating need for more acute intervention. Patient verbalized understanding. After Visit Summary given.   Rennis HardingWurst, Tabius Rood, PA-C 12/11/17 1223

## 2017-12-11 NOTE — ED Notes (Signed)
Materials given to pt with instructions to strain every void.  Pt stated understanding.

## 2017-12-11 NOTE — ED Triage Notes (Signed)
Pt presents with lower pelvic pain on left side that radiates around to lower back.

## 2017-12-11 NOTE — Discharge Instructions (Addendum)
Vital signs did not show signs of orthostatic hypotension.  Still recommending increasing fluids, drink at least half your body weight in ounces.  Eat a well-balanced diet of lean fruits and vegetables.  If lightheadedness persists please [facility].    Urine pregnancy negative Urine did show signs of trace blood.  This may be an indication of kidney stone Strain all urine and follow up with PCP with specimen Prescribed indomethacin.  Take as directed for pain and to help facilitate the stone to pass.  PCP assistance initiated   Follow up with PCP or [facility] if symptoms persist Return or go to ER if you have any new or worsening symptoms (difficulty urinating, blood in urine, pain that does not moderate with medication, fever, chills, abdominal pain, etc...)

## 2017-12-19 ENCOUNTER — Emergency Department (HOSPITAL_COMMUNITY): Payer: Medicaid Other

## 2017-12-19 ENCOUNTER — Other Ambulatory Visit: Payer: Self-pay

## 2017-12-19 ENCOUNTER — Emergency Department (HOSPITAL_COMMUNITY)
Admission: EM | Admit: 2017-12-19 | Discharge: 2017-12-19 | Disposition: A | Discharge status: Home / Self Care | Payer: Medicaid Other | Attending: Emergency Medicine | Admitting: Emergency Medicine

## 2017-12-19 ENCOUNTER — Ambulatory Visit (HOSPITAL_COMMUNITY)
Admission: EM | Admit: 2017-12-19 | Discharge: 2017-12-19 | Disposition: A | Discharge status: Home / Self Care | Payer: Medicaid Other | Attending: Family Medicine | Admitting: Family Medicine

## 2017-12-19 ENCOUNTER — Encounter (HOSPITAL_COMMUNITY): Payer: Self-pay | Admitting: Emergency Medicine

## 2017-12-19 DIAGNOSIS — R109 Unspecified abdominal pain: Secondary | ICD-10-CM

## 2017-12-19 DIAGNOSIS — R1032 Left lower quadrant pain: Secondary | ICD-10-CM | POA: Insufficient documentation

## 2017-12-19 DIAGNOSIS — Z79899 Other long term (current) drug therapy: Secondary | ICD-10-CM | POA: Diagnosis not present

## 2017-12-19 DIAGNOSIS — F17210 Nicotine dependence, cigarettes, uncomplicated: Secondary | ICD-10-CM | POA: Diagnosis not present

## 2017-12-19 LAB — URINALYSIS, ROUTINE W REFLEX MICROSCOPIC
Bilirubin Urine: NEGATIVE
Glucose, UA: NEGATIVE mg/dL
Hgb urine dipstick: NEGATIVE
Ketones, ur: 5 mg/dL — AB
Leukocytes, UA: NEGATIVE
Nitrite: NEGATIVE
PH: 5 (ref 5.0–8.0)
Protein, ur: NEGATIVE mg/dL
Specific Gravity, Urine: 1.034 — ABNORMAL HIGH (ref 1.005–1.030)

## 2017-12-19 LAB — BASIC METABOLIC PANEL
ANION GAP: 11 (ref 5–15)
BUN: 10 mg/dL (ref 6–20)
CO2: 23 mmol/L (ref 22–32)
CREATININE: 0.7 mg/dL (ref 0.44–1.00)
Calcium: 9.7 mg/dL (ref 8.9–10.3)
Chloride: 105 mmol/L (ref 98–111)
GFR calc Af Amer: >60 mL/min (ref >60)
GFR calc non Af Amer: >60 mL/min (ref >60)
Glucose, Bld: 74 mg/dL (ref 70–99)
POTASSIUM: 3.8 mmol/L (ref 3.5–5.1)
Sodium: 139 mmol/L (ref 135–145)

## 2017-12-19 LAB — CBC WITH DIFFERENTIAL/PLATELET
Abs Immature Granulocytes: 0.01 10*3/uL (ref 0.00–0.07)
Basophils Absolute: 0.1 10*3/uL (ref 0.0–0.1)
Basophils Relative: 2 %
EOS ABS: 0.1 10*3/uL (ref 0.0–0.5)
Eosinophils Relative: 1 %
HCT: 40 % (ref 36.0–46.0)
Hemoglobin: 12.6 g/dL (ref 12.0–15.0)
Immature Granulocytes: 0 %
Lymphocytes Relative: 49 %
Lymphs Abs: 3.1 10*3/uL (ref 0.7–4.0)
MCH: 27.6 pg (ref 26.0–34.0)
MCHC: 31.5 g/dL (ref 30.0–36.0)
MCV: 87.5 fL (ref 80.0–100.0)
Monocytes Absolute: 0.7 10*3/uL (ref 0.1–1.0)
Monocytes Relative: 11 %
NEUTROS ABS: 2.3 10*3/uL (ref 1.7–7.7)
NEUTROS PCT: 37 %
Platelets: 280 10*3/uL (ref 150–400)
RBC: 4.57 MIL/uL (ref 3.87–5.11)
RDW: 12.2 % (ref 11.5–15.5)
WBC: 6.3 10*3/uL (ref 4.0–10.5)
nRBC: 0 % (ref 0.0–0.2)

## 2017-12-19 LAB — I-STAT BETA HCG BLOOD, ED (MC, WL, AP ONLY): I-stat hCG, quantitative: <5 m[IU]/mL (ref <5)

## 2017-12-19 MED ORDER — SODIUM CHLORIDE 0.9 % IV BOLUS
1000.0000 mL | Freq: Once | INTRAVENOUS | Status: AC
  Administered 2017-12-19: 1000 mL via INTRAVENOUS

## 2017-12-19 MED ORDER — MORPHINE SULFATE (PF) 4 MG/ML IV SOLN
4.0000 mg | Freq: Once | INTRAVENOUS | Status: AC
  Administered 2017-12-19: 4 mg via INTRAVENOUS
  Filled 2017-12-19: qty 1

## 2017-12-19 MED ORDER — TRAMADOL HCL 50 MG PO TABS: 50.0000 mg | ORAL_TABLET | Freq: Four times a day (QID) | ORAL | 0 refills | Status: AC | PRN

## 2017-12-19 MED ORDER — TAMSULOSIN HCL 0.4 MG PO CAPS: 0.4000 mg | ORAL_CAPSULE | Freq: Every day | ORAL | 0 refills | Status: AC

## 2017-12-19 MED ORDER — ONDANSETRON 4 MG PO TBDP: 4.0000 mg | ORAL_TABLET | Freq: Three times a day (TID) | ORAL | 0 refills | Status: DC | PRN

## 2017-12-19 MED ORDER — ONDANSETRON HCL 4 MG/2ML IJ SOLN
4.0000 mg | Freq: Once | INTRAMUSCULAR | Status: AC
  Administered 2017-12-19: 4 mg via INTRAVENOUS
  Filled 2017-12-19: qty 2

## 2017-12-19 NOTE — ED Provider Notes (Signed)
[facility]    CSN: [id number] Arrival date & time: 12/19/17  4098     History   Chief Complaint Chief Complaint  Patient presents with  . Flank Pain  . Emesis    HPI Jordan Hanson is a 22 y.o. female.   Patient is a 22 year old female presents for continued left flank pain.  She was seen here on 12/11/2017.  Reports that her symptoms have worsened since being seen.  She is been taking  the indomethacin without much relief of symptoms.  Reports that the medicine makes her feel dizzy and  lightheaded.  She vomited this morning.  Rates her pain 8/10. Last urine revealed hematuria without infection.  She has had some body aches, chills and low-grade fever.  ROS per HPI      Past Medical History:  Diagnosis Date  . ADHD (attention deficit hyperactivity disorder)   . Bipolar disorder (HCC)   . Depression     Patient Active Problem List   Diagnosis Date Noted  . Borderline personality disorder (HCC) 02/13/2016  . Bipolar 1 disorder, depressed (HCC) 02/10/2016  . Suicidal ideation 07/19/2014  . 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 MDD (major depressive disorder) 06/17/2014    Past Surgical History:  Procedure Laterality Date  . TONSILLECTOMY      OB History   No obstetric history on file.      Home Medications    Prior to Admission medications   Medication Sig Start Date End Date Taking? Authorizing Provider  divalproex (DEPAKOTE ER) 500 MG 24 hr tablet Take 2 tablets (1,000 mg total) by mouth at bedtime. Patient taking differently: Take 500 mg by mouth 2 (two) times daily.  02/13/16   Adonis Brook, NP  Doxylamine-Pyridoxine 10-10 MG TBEC Take 1 tablet by mouth every 8 (eight) hours as needed. 09/07/17   Long, Arlyss Repress, MD  indomethacin (INDOCIN) 50 MG capsule Take 1 capsule (50 mg total) by mouth 2 (two) times daily with a meal. 12/11/17   Wurst, [place], PA-C  megestrol (MEGACE) 40 MG tablet Take  1 tablet (40 mg total) by mouth daily. 11/19/17   Dahlia Byes A, NP  metroNIDAZOLE (FLAGYL) 500 MG tablet Take 1 tablet (500 mg total) by mouth 2 (two) times daily. 11/21/17   Eustace Moore, MD  QUEtiapine (SEROQUEL) 300 MG tablet Take 1 tablet (300 mg total) by mouth at bedtime. 02/13/16   Adonis Brook, NP  ranitidine (ZANTAC) 150 MG capsule Take 150 mg by mouth 2 (two) times daily as needed for heartburn.    [provider]  sertraline (ZOLOFT) 50 MG tablet Take 3 tablets (150 mg total) by mouth daily. Patient taking differently: Take 100 mg by mouth daily.  02/14/16   Adonis Brook, NP    Family History History reviewed. No pertinent family history.  Social History Social History   Tobacco Use  . Smoking status: Current Every Day Smoker    Packs/day: 0.50    Types: Cigarettes  . Smokeless tobacco: Never Used  Substance Use Topics  . Alcohol use: No    Comment: occ  . Drug use: No    Comment: smokes weed every day     Allergies   Geodon [ziprasidone hcl] and Penicillins   Review of Systems Review of Systems   Physical Exam Triage Vital Signs ED Triage Vitals  Enc Vitals Group     BP 12/19/17 0845 109/71     Pulse Rate  12/19/17 0845 96     Resp 12/19/17 0845 20     Temp 12/19/17 0845 98.2 F (36.8 C)     Temp Source 12/19/17 0845 Oral     SpO2 12/19/17 0845 99 %     Weight --      Height --      Head Circumference --      Peak Flow --      Pain Score 12/19/17 0847 8     Pain Loc --      Pain Edu? --      Excl. in GC? --    No data found.  Updated Vital Signs BP 109/71 (BP Location: Right Arm)   Pulse 96   Temp 98.2 F (36.8 C) (Oral)   Resp 20   LMP 11/28/2017   SpO2 99%   Visual Acuity Right Eye Distance:   Left Eye Distance:   Bilateral Distance:    Right Eye Near:   Left Eye Near:    Bilateral Near:     Physical Exam Vitals signs and nursing note reviewed.  Constitutional:      Appearance: Normal appearance.  HENT:      Head: Normocephalic.  Pulmonary:     Effort: Pulmonary effort is normal.  Abdominal:     General: Abdomen is flat. Bowel sounds are normal.     Palpations: Abdomen is soft.  Musculoskeletal: Normal range of motion.  Skin:    General: Skin is warm and dry.  Neurological:     General: No focal deficit present.     Mental Status: She is alert.  Psychiatric:        Mood and Affect: Mood normal.      UC Treatments / Results  Labs (all labs ordered are listed, but only abnormal results are displayed) Labs Reviewed - No data to display  EKG None  Radiology No results found.  Procedures Procedures (including critical care time)  Medications Ordered in UC Medications - No data to display  Initial Impression / Assessment and Plan / UC Course  I have reviewed the triage vital signs and the nursing notes.  Pertinent labs & imaging results that were available during my care of the patient were reviewed by me and considered in my medical decision making (see chart for details).     Patient is a 22-year female with continued flank pain.  She was seen here on 12/11/2017 and her symptoms have worsened.  She is vomiting. Sending her to the ER for further evaluation and work-up with CT scan to rule out kidney stone. Final Clinical Impressions(s) / UC Diagnoses   Final diagnoses:  Flank pain     Discharge Instructions     Please go to the ER for further evaluation with CT scan    ED Prescriptions    None     Controlled Substance Prescriptions [place] Controlled Substance Registry consulted? Not Applicable   Janace ArisBast, Edan Juday A, NP 12/19/17 [phone]

## 2017-12-19 NOTE — ED Provider Notes (Signed)
[facility] Provider Note   CSN: [id number] Arrival date & time: 12/19/17  [id number]     History   Chief Complaint Chief Complaint  Patient presents with  . Flank Pain    HPI Jordan Hanson is a 22 y.o. female is here from urgent care for further evaluation of left flank pain.  Onset 2 weeks ago, worsening, constant, sharp.  The pain begins in the left mid abdomen and radiates to her left flank and low back.  No alleviating or aggravating factors.  She was evaluated at urgent care when it began and was discharged with indomethacin for suspected kidney stone.  This is not been helping.  Additionally has been taken ibuprofen without benefit.  Associated with nausea and emesis x2 today, urinary frequency and urgency.  She denies any fevers, chills, chest pain, shortness of breath, rash to the area, changes in bowel movements, dysuria, vaginal discharge changes.  LMP 3 weeks ago.  No recent trauma, falls, exertional activity.  No saddle anesthesia, loss of bladder or bowel control.  No previous history of kidney stones, kidney or bladder infections.  No history of diverticulitis.  HPI  Past Medical History:  Diagnosis Date  . ADHD (attention deficit hyperactivity disorder)   . Bipolar disorder (HCC)   . Depression     Patient Active Problem List   Diagnosis Date Noted  . Borderline personality disorder (HCC) 02/13/2016  . Bipolar 1 disorder, depressed (HCC) 02/10/2016  . Suicidal ideation 07/19/2014  . 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 MDD (major depressive disorder) 06/17/2014    Past Surgical History:  Procedure Laterality Date  . TONSILLECTOMY       OB History   No obstetric history on file.      Home Medications    Prior to Admission medications   Medication Sig Start Date End Date Taking? Authorizing Provider  divalproex (DEPAKOTE ER) 500 MG 24 hr tablet Take 2 tablets (1,000  mg total) by mouth at bedtime. Patient taking differently: Take 500 mg by mouth 2 (two) times daily.  02/13/16   Adonis BrookAgustin, Sheila, NP  Doxylamine-Pyridoxine 10-10 MG TBEC Take 1 tablet by mouth every 8 (eight) hours as needed. 09/07/17   Long, Arlyss RepressJoshua G, MD  indomethacin (INDOCIN) 50 MG capsule Take 1 capsule (50 mg total) by mouth 2 (two) times daily with a meal. 12/11/17   Wurst, [place], PA-C  megestrol (MEGACE) 40 MG tablet Take 1 tablet (40 mg total) by mouth daily. 11/19/17   Dahlia ByesBast, Traci A, NP  metroNIDAZOLE (FLAGYL) 500 MG tablet Take 1 tablet (500 mg total) by mouth 2 (two) times daily. 11/21/17   Eustace MooreNelson, Yvonne Sue, MD  ondansetron (ZOFRAN ODT) 4 MG disintegrating tablet Take 1 tablet (4 mg total) by mouth every 8 (eight) hours as needed for nausea or vomiting. 12/19/17   Liberty HandyGibbons, Cristian Grieves J, PA-C  QUEtiapine (SEROQUEL) 300 MG tablet Take 1 tablet (300 mg total) by mouth at bedtime. 02/13/16   Adonis BrookAgustin, Sheila, NP  ranitidine (ZANTAC) 150 MG capsule Take 150 mg by mouth 2 (two) times daily as needed for heartburn.    [provider]  sertraline (ZOLOFT) 50 MG tablet Take 3 tablets (150 mg total) by mouth daily. Patient taking differently: Take 100 mg by mouth daily.  02/14/16   Adonis BrookAgustin, Sheila, NP  tamsulosin (FLOMAX) 0.4 MG CAPS capsule Take 1 capsule (0.4 mg total) by mouth daily for 15 days. 12/19/17 01/03/18  Liberty Handy, PA-C  traMADol (ULTRAM) 50 MG tablet Take 1 tablet (50 mg total) by mouth every 6 (six) hours as needed for up to 2 days. 12/19/17 12/21/17  Liberty Handy, PA-C    Family History No family history on file.  Social History Social History   Tobacco Use  . Smoking status: Current Every Day Smoker    Packs/day: 0.50    Types: Cigarettes  . Smokeless tobacco: Never Used  Substance Use Topics  . Alcohol use: No    Comment: occ  . Drug use: No    Comment: smokes weed every day     Allergies   Geodon [ziprasidone hcl] and Penicillins   Review of  Systems Review of Systems  Gastrointestinal: Positive for abdominal pain, nausea and vomiting.  Genitourinary: Positive for flank pain, frequency and urgency.  All other systems reviewed and are negative.    Physical Exam Updated Vital Signs BP 107/65   Pulse 60   Temp 97.8 F (36.6 C) (Oral)   Resp 18   Ht 5\' 5"  (1.651 m)   Wt 63.5 kg   LMP 11/28/2017   SpO2 100%   BMI 23.30 kg/m   Physical Exam Vitals signs and nursing note reviewed.  Constitutional:      Appearance: She is well-developed.     Comments: Non toxic  HENT:     Head: Normocephalic and atraumatic.     Nose: Nose normal.  Eyes:     Conjunctiva/sclera: Conjunctivae normal.     Pupils: Pupils are equal, round, and reactive to light.  Neck:     Musculoskeletal: Normal range of motion.  Cardiovascular:     Rate and Rhythm: Normal rate and regular rhythm.     Comments: 1+ DP and radial pulses bilaterally.  All extremities warm. Pulmonary:     Effort: Pulmonary effort is normal.     Breath sounds: Normal breath sounds.  Abdominal:     General: Bowel sounds are normal.     Palpations: Abdomen is soft.     Tenderness: There is abdominal tenderness.     Comments: Mild tenderness to the left mid abdomen.  Left CVAT.  No guarding, rigidity.  No suprapubic tenderness.  Negative Murphy's and McBurney's.  No rash to the abdomen or flank.  Musculoskeletal: Normal range of motion.  Skin:    General: Skin is warm and dry.     Capillary Refill: Capillary refill takes less than 2 seconds.  Neurological:     Mental Status: She is alert and oriented to person, place, and time.  Psychiatric:        Behavior: Behavior normal.      ED Treatments / Results  Labs (all labs ordered are listed, but only abnormal results are displayed) Labs Reviewed  URINALYSIS, ROUTINE W REFLEX MICROSCOPIC - Abnormal; Notable for the following components:      Result Value   Color, Urine AMBER (*)    APPearance HAZY (*)    Specific  Gravity, Urine 1.034 (*)    Ketones, ur 5 (*)    All other components within normal limits  URINE CULTURE  CBC WITH DIFFERENTIAL/PLATELET  BASIC METABOLIC PANEL  I-STAT BETA HCG BLOOD, ED ([facility], [facility], [facility] ONLY)    EKG None  Radiology Ct Renal Stone Study  Result Date: 12/19/2017 CLINICAL DATA:  Pt c/o LEFT flank that has moved to LEFT groin; pt denies prior renal stones, or ovarian issues; mo prior surgeries EXAM: CT ABDOMEN AND PELVIS  WITHOUT CONTRAST TECHNIQUE: Multidetector CT imaging of the abdomen and pelvis was performed following the standard protocol without IV contrast. COMPARISON:  11/21/2014 FINDINGS: Lower chest: No acute abnormality. Hepatobiliary: No focal liver abnormality is seen. No gallstones, gallbladder wall thickening, or biliary dilatation. Pancreas: Unremarkable. No pancreatic ductal dilatation or surrounding inflammatory changes. Spleen: Normal in size without focal abnormality. Adrenals/Urinary Tract: Normal adrenals. Normal kidneys. No hydronephrosis. Urinary bladder is incompletely distended. Scattered punctate calcifications near the posterior wall the urinary bladder bilaterally, most probably phleboliths although small non-obstructing distal ureteral calculi could have a similar appearance. Stomach/Bowel: Stomach and small bowel decompressed. Normal appendix. The colon is nondilated, unremarkable. Vascular/Lymphatic: Pelvic phleboliths. No arterial pathology identified. No abdominal or pelvic adenopathy. Reproductive: Uterus and bilateral adnexa are unremarkable. Other: No ascites. No free air. 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 No acute fracture or worrisome bone lesion. IMPRESSION: 1. No hydronephrosis or convincing urolithiasis. 2. Punctate calcifications near the posterior wall of the urinary bladder bilaterally, most likely phleboliths although small non-obstructing distal ureteral calculi could have a similar  appearance. Electronically Signed   By: Corlis Leak M.D.   On: 12/19/2017 10:56    Procedures Procedures (including critical care time)  Medications Ordered in ED Medications  sodium chloride 0.9 % bolus 1,000 mL (0 mLs Intravenous Stopped 12/19/17 1109)  morphine 4 MG/ML injection 4 mg (4 mg Intravenous Given 12/19/17 0942)  ondansetron (ZOFRAN) injection 4 mg (4 mg Intravenous Given 12/19/17 0943)     Initial Impression / Assessment and Plan / ED Course  I have reviewed the triage vital signs and the nursing notes.  Pertinent labs & imaging results that were available during my care of the patient were reviewed by me and considered in my medical decision making (see chart for details).  Clinical Course as of Dec 19 1144  Thu Dec 19, 2017  1103 IMPRESSION: 1. No hydronephrosis or convincing urolithiasis. 2. Punctate calcifications near the posterior wall of the urinary bladder bilaterally, most likely phleboliths although small non-obstructing distal ureteral calculi could have a similar appearance.  CT Renal Soundra Pilon [CG]    Clinical Course User Index [CG] Liberty Handy, PA-C    Differential diagnosis includes renal stone versus pyelonephritis/UTI.  She has no overlying rash.  No trauma or falls, cauda equina symptoms.  She has no changes to bowel movements such as diarrhea.  No vaginal discharge.  We will obtain screening labs, urinalysis and CT renal study.  1145: CT with possible calculi vs phlebolith in posterior bladder.  Some stool burden as well. Given symptoms we will tx as renal calculi with tramadol, NSAID, flomax and f/u with urology.  Stool softener/miralax encouraged. She has no fever, leukocytosis. I don't see indication for further emergent lab work or imaging. Return precautions given. Pt in agreement. Discussed with Dr Donnald Garre.  Final Clinical Impressions(s) / ED Diagnoses   Final diagnoses:  Left flank pain    ED Discharge Orders         Ordered     traMADol (ULTRAM) 50 MG tablet  Every 6 hours PRN     12/19/17 1129    ondansetron (ZOFRAN ODT) 4 MG disintegrating tablet  Every 8 hours PRN     12/19/17 1129    tamsulosin (FLOMAX) 0.4 MG CAPS capsule  Daily     12/19/17 1144           Liberty Handy, [place] 12/19/17 1146    Pfeiffer, [place],  MD 12/19/17 1411

## 2017-12-19 NOTE — ED Triage Notes (Addendum)
Pt c/o L flank pain beginning 2 wks ago. Emesis x 1 today, frequent urination. Has been taking Ibuprofen w/o pain relief. No fevers

## 2017-12-19 NOTE — ED Triage Notes (Signed)
Pt presents to ED for assessment of left flank pain with emesis.  States she was seen previously for the same, states she was able to take medications with some relief until yesterday.

## 2017-12-19 NOTE — Discharge Instructions (Signed)
You were seen in the ER for left mid abdominal and flank pain.  CT showed possible tiny stones almost at your bladder.  This also looked like calcifications so the exact etiology is unclear.  We will treat your symptoms as possible recently passed or passing stone.  Take Flomax to dilate the ureter.  Take 500 to 1000 mg of acetaminophen every 6 hours to help with the pain.  For more severe pain take tramadol 50 mg every 6 hours.  CT showed some stool buildup, this could be contributing to your pain.  For constipation take a stool softener every day as well as MiraLAX powder.  Tramadol which is a narcotic pain medicine can cause some constipation.  Stay well-hydrated.  Return to the ER if there is fever greater than 100.64F, vomiting, diarrhea, constipation, worsening pain, rash, abnormal vaginal bleeding, burning with urination

## 2017-12-19 NOTE — ED Notes (Signed)
PT sent to ED by provider. Denies assistance.

## 2017-12-19 NOTE — Discharge Instructions (Addendum)
Please go to the ER for further evaluation with CT scan

## 2017-12-20 LAB — URINE CULTURE: Culture: NO GROWTH

## 2018-01-21 ENCOUNTER — Ambulatory Visit (HOSPITAL_COMMUNITY)
Admission: EM | Admit: 2018-01-21 | Discharge: 2018-01-21 | Disposition: A | Discharge status: Home / Self Care | Payer: Medicaid Other | Attending: Family Medicine | Admitting: Family Medicine

## 2018-01-21 ENCOUNTER — Encounter (HOSPITAL_COMMUNITY): Payer: Self-pay | Admitting: Emergency Medicine

## 2018-01-21 DIAGNOSIS — R109 Unspecified abdominal pain: Secondary | ICD-10-CM | POA: Insufficient documentation

## 2018-01-21 DIAGNOSIS — N200 Calculus of kidney: Secondary | ICD-10-CM | POA: Diagnosis present

## 2018-01-21 LAB — POCT URINALYSIS DIP (DEVICE)
Bilirubin Urine: NEGATIVE
Glucose, UA: NEGATIVE mg/dL
Ketones, ur: NEGATIVE mg/dL
Leukocytes, UA: NEGATIVE
Nitrite: NEGATIVE
Protein, ur: NEGATIVE mg/dL
Specific Gravity, Urine: >=1.03 (ref 1.005–1.030)
Urobilinogen, UA: 0.2 mg/dL (ref 0.0–1.0)
pH: 6 (ref 5.0–8.0)

## 2018-01-21 MED ORDER — TRAMADOL HCL 50 MG PO TABS: 50.0000 mg | ORAL_TABLET | Freq: Four times a day (QID) | ORAL | 0 refills | Status: DC | PRN

## 2018-01-21 MED ORDER — TAMSULOSIN HCL 0.4 MG PO CAPS: 0.4000 mg | ORAL_CAPSULE | Freq: Every day | ORAL | 0 refills | Status: DC

## 2018-01-21 NOTE — ED Triage Notes (Signed)
Pt here for left sided flank pain; pt sts hx of same and told likely due to kidney stones

## 2018-01-21 NOTE — ED Provider Notes (Addendum)
Patient: Jordan Hanson MRN: [id number] DOB: [birth year] PCP: Patient, No Pcp Per     Subjective:  Chief Complaint  Patient presents with  . Flank Pain    HPI: The patient is a 23 y.o. female who presents today for left flank pain that started today. It is in her LLQ and radiates to her left flank. It's worse with sitting. Pain rated as a 7.5 and is sharp in nature. Pain is constant. Nothing has helped make it better. No pain with urination, no visible blood, but she does have increased frequency. No urgency. She has hx of this flank pain that started on 12/4. She was seen at urgent care and treated for presumed kidney stone; however, her symptoms got worse and she came back to urgent care on 12/12 where she was sent to ER for further care with CT. Ct showed no convincing urolithiasis. She has punctate calcification near the posterior wall of the urinary bladder bilaterallyl most likely phleboliths although small non obstructing distal uretreral calculi could have a similar appearnce. She was given flomax and tramadol and she states her symptoms went away. She was supposed to follow up with urology, but did not do this. She has had no fever/chills, pain with sex. Denies any vaginal discharge. Not on period. LMP was one week ago. Denies being pregnant.    Review of Systems  Constitutional: Negative for chills, diaphoresis and fever.  Respiratory: Negative for shortness of breath.   Cardiovascular: Negative for chest pain.  Gastrointestinal: Positive for abdominal pain. Negative for blood in stool, constipation, diarrhea, nausea and vomiting.  Genitourinary: Positive for flank pain and frequency. Negative for difficulty urinating, dyspareunia, dysuria, hematuria, pelvic pain, urgency, vaginal bleeding, vaginal discharge and vaginal pain.  Musculoskeletal: Negative for arthralgias.    Allergies Patient is allergic to geodon [ziprasidone hcl] and penicillins.  Past Medical History Patient  has  a past medical history of ADHD (attention deficit hyperactivity disorder), Bipolar disorder (HCC), and Depression.  Surgical History Patient  has a past surgical history that includes Tonsillectomy.  Family History Pateint's family history is not on file.  Social History Patient  reports that she has been smoking cigarettes. She has been smoking about 0.50 packs per day. She has never used smokeless tobacco. She reports that she does not drink alcohol or use drugs.    Objective: Vitals:   01/21/18 2004  BP: (!) 137/92  Pulse: 94  Resp: 18  Temp: 99.9 F (37.7 C)  TempSrc: Oral  SpO2: 100%    There is no height or weight on file to calculate BMI.  Physical Exam Vitals signs reviewed.  Constitutional:      General: She is not in acute distress.    Appearance: Normal appearance. She is not ill-appearing or diaphoretic.  Neck:     Musculoskeletal: Normal range of motion and neck supple.  Cardiovascular:     Rate and Rhythm: Normal rate and regular rhythm.     Heart sounds: Normal heart sounds.  Pulmonary:     Effort: Pulmonary effort is normal. No respiratory distress.     Breath sounds: Normal breath sounds. No wheezing or rales.  Abdominal:     General: Abdomen is flat. Bowel sounds are normal.     Palpations: Abdomen is soft.     Tenderness: There is abdominal tenderness (LLQ). There is left CVA tenderness. There is no guarding or rebound.  Lymphadenopathy:     Cervical: No cervical adenopathy.  Skin:    General:  Skin is warm.     Capillary Refill: Capillary refill takes less than 2 seconds.  Neurological:     General: No focal deficit present.     Mental Status: She is alert and oriented to person, place, and time.    UA: +hgb. No other abnormality     Assessment/plan:   ICD-10-CM   1. Flank pain R10.9   2. Nephrolithiasis N20.0    1) flank pain: similar to prior pain; however, CT not convincing for stone in December. Denies pregnancy, urinary tract  symptoms, UA not suspicious for UTI, denies any STDs.  Did not follow up with urology. Has hgb in urine, no other signs of infection. Will do 7 day course of flomax with tramadol prn for severe pain. I do want her to follow up with urology and gave all information to her for making this appointment. She was given strict ER precautions that if she has fever, N/V, worsening pain she is to go.    Orland MustardWolfe, Nedra Mcinnis, MD 01/21/18 2059    Orland MustardWolfe, Fate Galanti, MD 01/21/18 2109

## 2018-01-21 NOTE — Discharge Instructions (Addendum)
Sending in Flomax for you to take once a day for 7 days as well as pain medication called tramadol that you can take every 6 hours as needed for severe pain.  You want you to follow-up with urology and have provided the contact information for you to call and make an appointment.  If fever worsening pain nausea vomiting please go to emergency room for further care.

## 2018-01-23 LAB — URINE CULTURE: Culture: 40000 — AB

## 2018-01-27 ENCOUNTER — Encounter (HOSPITAL_COMMUNITY): Payer: Self-pay

## 2018-01-27 ENCOUNTER — Other Ambulatory Visit: Payer: Self-pay

## 2018-01-27 ENCOUNTER — Emergency Department (HOSPITAL_COMMUNITY)
Admission: EM | Admit: 2018-01-27 | Discharge: 2018-01-27 | Disposition: A | Discharge status: Home / Self Care | Payer: Medicaid Other | Attending: Emergency Medicine | Admitting: Emergency Medicine

## 2018-01-27 DIAGNOSIS — R1084 Generalized abdominal pain: Secondary | ICD-10-CM | POA: Insufficient documentation

## 2018-01-27 DIAGNOSIS — F17210 Nicotine dependence, cigarettes, uncomplicated: Secondary | ICD-10-CM | POA: Diagnosis not present

## 2018-01-27 DIAGNOSIS — Z79899 Other long term (current) drug therapy: Secondary | ICD-10-CM | POA: Diagnosis not present

## 2018-01-27 DIAGNOSIS — R109 Unspecified abdominal pain: Secondary | ICD-10-CM

## 2018-01-27 LAB — CBC WITH DIFFERENTIAL/PLATELET
Abs Immature Granulocytes: 0.01 K/uL (ref 0.00–0.07)
Basophils Absolute: 0.1 K/uL (ref 0.0–0.1)
Basophils Relative: 2 %
Eosinophils Absolute: 0.1 K/uL (ref 0.0–0.5)
Eosinophils Relative: 1 %
HCT: 43.3 % (ref 36.0–46.0)
Hemoglobin: 14 g/dL (ref 12.0–15.0)
Immature Granulocytes: 0 %
Lymphocytes Relative: 42 %
Lymphs Abs: 2.1 K/uL (ref 0.7–4.0)
MCH: 28.1 pg (ref 26.0–34.0)
MCHC: 32.3 g/dL (ref 30.0–36.0)
MCV: 86.8 fL (ref 80.0–100.0)
Monocytes Absolute: 0.7 K/uL (ref 0.1–1.0)
Monocytes Relative: 15 %
Neutro Abs: 1.9 K/uL (ref 1.7–7.7)
Neutrophils Relative %: 40 %
Platelets: 285 K/uL (ref 150–400)
RBC: 4.99 MIL/uL (ref 3.87–5.11)
RDW: 12.7 % (ref 11.5–15.5)
WBC: 4.9 K/uL (ref 4.0–10.5)
nRBC: 0 % (ref 0.0–0.2)

## 2018-01-27 LAB — URINALYSIS, ROUTINE W REFLEX MICROSCOPIC
BACTERIA UA: NONE SEEN
Bilirubin Urine: NEGATIVE
Glucose, UA: NEGATIVE mg/dL
Ketones, ur: NEGATIVE mg/dL
NITRITE: NEGATIVE
Protein, ur: NEGATIVE mg/dL
Specific Gravity, Urine: 1.015 (ref 1.005–1.030)
pH: 5 (ref 5.0–8.0)

## 2018-01-27 LAB — BASIC METABOLIC PANEL WITH GFR
Anion gap: 8 (ref 5–15)
BUN: 8 mg/dL (ref 6–20)
CO2: 22 mmol/L (ref 22–32)
Calcium: 9.7 mg/dL (ref 8.9–10.3)
Chloride: 106 mmol/L (ref 98–111)
Creatinine, Ser: 0.65 mg/dL (ref 0.44–1.00)
GFR calc Af Amer: >60 mL/min
GFR calc non Af Amer: >60 mL/min
Glucose, Bld: 81 mg/dL (ref 70–99)
Potassium: 4.1 mmol/L (ref 3.5–5.1)
Sodium: 136 mmol/L (ref 135–145)

## 2018-01-27 LAB — PREGNANCY, URINE: PREG TEST UR: NEGATIVE

## 2018-01-27 MED ORDER — KETOROLAC TROMETHAMINE 30 MG/ML IJ SOLN
30.0000 mg | Freq: Once | INTRAMUSCULAR | Status: AC
  Administered 2018-01-27: 30 mg via INTRAVENOUS
  Filled 2018-01-27: qty 1

## 2018-01-27 MED ORDER — TRAMADOL HCL 50 MG PO TABS: 50.0000 mg | ORAL_TABLET | Freq: Four times a day (QID) | ORAL | 0 refills | Status: AC | PRN

## 2018-01-27 MED ORDER — SODIUM CHLORIDE 0.9 % IV BOLUS
1000.0000 mL | Freq: Once | INTRAVENOUS | Status: AC
  Administered 2018-01-27: 1000 mL via INTRAVENOUS

## 2018-01-27 MED ORDER — ONDANSETRON 4 MG PO TBDP: 4.0000 mg | ORAL_TABLET | Freq: Three times a day (TID) | ORAL | 0 refills | Status: DC | PRN

## 2018-01-27 MED ORDER — ONDANSETRON HCL 4 MG/2ML IJ SOLN
4.0000 mg | Freq: Once | INTRAMUSCULAR | Status: AC
  Administered 2018-01-27: 4 mg via INTRAVENOUS
  Filled 2018-01-27: qty 2

## 2018-01-27 NOTE — ED Provider Notes (Addendum)
[facility] Provider Note   CSN: [id number] Arrival date & time: 01/27/18  1223   History   Chief Complaint Chief Complaint  Patient presents with  . Flank Pain    HPI Jordan Hanson is a 23 y.o. female with a PMH of depression, ADHD, and Bipolar disorder presenting with constant left flank pain onset 6 days ago. Patient describes pain as a sharp cramp. Patient reports 3 episodes of vomiting and 5-6 episodes of diarrhea per day. Patient states sitting makes her symptoms worse and laying down makes her symptoms better. Patient states she had similar symptoms in 12/2017 and clinically diagnosed with a kidney stone and prescribed Flomax. Patient reports symptoms resolved until 6 days ago. Patient states she has been evaluated by an urgent care and urology last week. Patient states urology diagnosed her with a UTI and prescribed Bactrim. Patient states she had a rash in response to the antibiotic and urology told her she does not have a UTI and advised her to stop antibiotic. Patient reports urinary frequency, but denies dysuria, fever, chills, or blood in urine. Patient denies pelvic pain, vaginal discharge, lesions, or a hx of STIs. Patient denies a history of kidney stones prior to these episodes and denies a family history of kidney stones. LMP was 2 weeks ago.   HPI  Past Medical History:  Diagnosis Date  . ADHD (attention deficit hyperactivity disorder)   . Bipolar disorder (HCC)   . Depression     Patient Active Problem List   Diagnosis Date Noted  . Borderline personality disorder (HCC) 02/13/2016  . Bipolar 1 disorder, depressed (HCC) 02/10/2016  . Suicidal ideation 07/19/2014  . 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 MDD (major depressive disorder) 06/17/2014    Past Surgical History:  Procedure Laterality Date  . TONSILLECTOMY       OB History   No obstetric history on file.       Home Medications    Prior to Admission medications   Medication Sig Start Date End Date Taking? Authorizing Provider  divalproex (DEPAKOTE ER) 500 MG 24 hr tablet Take 2 tablets (1,000 mg total) by mouth at bedtime. Patient taking differently: Take 500 mg by mouth 2 (two) times daily.  02/13/16   Adonis BrookAgustin, Sheila, NP  Doxylamine-Pyridoxine 10-10 MG TBEC Take 1 tablet by mouth every 8 (eight) hours as needed. 09/07/17   Long, Arlyss RepressJoshua G, MD  indomethacin (INDOCIN) 50 MG capsule Take 1 capsule (50 mg total) by mouth 2 (two) times daily with a meal. 12/11/17   Wurst, GrenadaBrittany, PA-C  megestrol (MEGACE) 40 MG tablet Take 1 tablet (40 mg total) by mouth daily. 11/19/17   Dahlia ByesBast, Traci A, NP  metroNIDAZOLE (FLAGYL) 500 MG tablet Take 1 tablet (500 mg total) by mouth 2 (two) times daily. Patient not taking: Reported on 01/21/2018 11/21/17   Eustace MooreNelson, Yvonne Sue, MD  ondansetron (ZOFRAN ODT) 4 MG disintegrating tablet Take 1 tablet (4 mg total) by mouth every 8 (eight) hours as needed for nausea or vomiting. 01/27/18   Carlyle BasquesHernandez, Chantale Leugers P, PA-C  QUEtiapine (SEROQUEL) 300 MG tablet Take 1 tablet (300 mg total) by mouth at bedtime. 02/13/16   Adonis BrookAgustin, Sheila, NP  ranitidine (ZANTAC) 150 MG capsule Take 150 mg by mouth 2 (two) times daily as needed for heartburn.    [provider]  sertraline (ZOLOFT) 50 MG tablet Take 3 tablets (150 mg total) by mouth daily. Patient taking differently:  Take 100 mg by mouth daily.  02/14/16   Adonis Brook, NP  tamsulosin (FLOMAX) 0.4 MG CAPS capsule Take 1 capsule (0.4 mg total) by mouth daily. 01/21/18   Orland Mustard, MD  traMADol (ULTRAM) 50 MG tablet Take 1 tablet (50 mg total) by mouth every 6 (six) hours as needed for up to 3 days. 01/27/18 01/30/18  Leretha Dykes, PA-C    Family History History reviewed. No pertinent family history.  Social History Social History   Tobacco Use  . Smoking status: Current Every Day Smoker    Packs/day: 0.50    Types:  Cigarettes  . Smokeless tobacco: Never Used  Substance Use Topics  . Alcohol use: No    Comment: occ  . Drug use: No    Comment: smokes weed every day     Allergies   Geodon [ziprasidone hcl] and Penicillins   Review of Systems Review of Systems  Constitutional: Positive for appetite change. Negative for activity change, chills, fever and unexpected weight change.  HENT: Negative for congestion, rhinorrhea and sore throat.   Eyes: Negative for visual disturbance.  Respiratory: Negative for cough and shortness of breath.   Cardiovascular: Negative for chest pain.  Gastrointestinal: Positive for abdominal pain, diarrhea, nausea and vomiting. Negative for constipation.  Endocrine: Negative for polydipsia, polyphagia and polyuria.  Genitourinary: Positive for flank pain and frequency. Negative for dysuria, hematuria, menstrual problem and vaginal discharge.  Musculoskeletal: Negative for back pain.  Skin: Negative for color change and rash.  Allergic/Immunologic: Negative for immunocompromised state.  Psychiatric/Behavioral: The patient is not nervous/anxious.     Physical Exam Updated Vital Signs BP 111/81 (BP Location: Right Arm)   Pulse (!) 108   Temp 98.5 F (36.9 C) (Oral)   Resp 18   LMP 01/13/2018 (Within Days)   SpO2 97%   Physical Exam Vitals signs and nursing note reviewed.  Constitutional:      General: She is not in acute distress.    Appearance: She is well-developed. She is not diaphoretic.  HENT:     Head: Normocephalic and atraumatic.     Nose: Nose normal. No congestion or rhinorrhea.     Mouth/Throat:     Mouth: Mucous membranes are dry.     Pharynx: No oropharyngeal exudate or posterior oropharyngeal erythema.  Eyes:     Conjunctiva/sclera: Conjunctivae normal.  Neck:     Musculoskeletal: Normal range of motion and neck supple.  Cardiovascular:     Rate and Rhythm: Normal rate and regular rhythm.     Heart sounds: Normal heart sounds. No murmur.  No friction rub. No gallop.   Pulmonary:     Effort: Pulmonary effort is normal. No respiratory distress.     Breath sounds: Normal breath sounds. No wheezing or rales.  Abdominal:     General: Bowel sounds are normal. There is no distension.     Palpations: Abdomen is soft. Abdomen is not rigid. There is no mass.     Tenderness: There is abdominal tenderness in the suprapubic area and left lower quadrant. There is left CVA tenderness. There is no right CVA tenderness, guarding or rebound.     Hernia: No hernia is present.  Musculoskeletal: Normal range of motion.  Skin:    General: Skin is warm.     Findings: No rash.  Neurological:     Mental Status: She is alert and oriented to person, place, and time.    ED Treatments / Results  Labs (all labs  ordered are listed, but only abnormal results are displayed) Labs Reviewed  URINALYSIS, ROUTINE W REFLEX MICROSCOPIC - Abnormal; Notable for the following components:      Result Value   APPearance HAZY (*)    Hgb urine dipstick SMALL (*)    Leukocytes, UA TRACE (*)    All other components within normal limits  URINE CULTURE  PREGNANCY, URINE  BASIC METABOLIC PANEL  CBC WITH DIFFERENTIAL/PLATELET    EKG None  Radiology No results found.  Procedures Procedures (including critical care time)  Medications Ordered in ED Medications  sodium chloride 0.9 % bolus 1,000 mL (1,000 mLs Intravenous New Bag/Given 01/27/18 1325)  ondansetron (ZOFRAN) injection 4 mg (4 mg Intravenous Given 01/27/18 1328)  ketorolac (TORADOL) 30 MG/ML injection 30 mg (30 mg Intravenous Given 01/27/18 1359)     Initial Impression / Assessment and Plan / ED Course  I have reviewed the triage vital signs and the nursing notes.  Pertinent labs & imaging results that were available during my care of the patient were reviewed by me and considered in my medical decision making (see chart for details).  Clinical Course as of Jan 27 1437  Mon Jan 27, 2018   1404 Small amount of Hgb noted in urine. Trace leukocytes. Will send for urine culture.   Hgb urine dipstick(!): SMALL [AH]  1414 CBC and BMP are unremarkable.  CBC with Differential [AH]  1424 Pt reports pain has improved while in the ER.   [AH]    Clinical Course User Index [AH] Leretha Dykes, PA-C   Patient presents with left flank pain. Suspect symptoms may be due to nephrolithiasis. Patient is nontoxic, nonseptic appearing, in no apparent distress.  Patient's pain and other symptoms adequately managed in emergency department.  Fluid bolus given.  Labs, imaging and vitals reviewed.  Patient had a CT renal stone study on 12/19/2017 that reveals calcifications in bladder. Do not suspect patient requires another CT renal stone study at this time. Patient does not meet the SIRS or Sepsis criteria.  On repeat exam patient does not have a surgical abdomin and there are no peritoneal signs.  No indication of appendicitis, bowel obstruction, bowel perforation, cholecystitis, diverticulitis, PID or ectopic pregnancy.  Patient discharged home with symptomatic treatment and given strict instructions for follow-up with their primary care physician.  Narcotic Database was accessed.  In the last 12 months, pt has had 2 prescriptions for controlled substances from 2 providers. Discussed pain medicine with patient and encouraged patient to follow up with urology in 2 days. I have also discussed reasons to return immediately to the ER.  Patient expresses understanding and agrees with plan.  Final Clinical Impressions(s) / ED Diagnoses   Final diagnoses:  Left flank pain    ED Discharge Orders         Ordered    ondansetron (ZOFRAN ODT) 4 MG disintegrating tablet  Every 8 hours PRN     01/27/18 1437    traMADol (ULTRAM) 50 MG tablet  Every 6 hours PRN     01/27/18 1437             Leretha Dykes, PA-C 01/27/18 1446    Eber Hong, MD 01/29/18 1156

## 2018-01-27 NOTE — Discharge Instructions (Addendum)
You have been seen today for left flank pain. Please read and follow all provided instructions.   1. Medications: tramadol for pain - do not operate heavy machinery or drive while taking this medication, zofran for nausea, usual home medications 2. Treatment: rest, drink plenty of fluids 3. Follow Up: Please follow up with your primary doctor in 2 days for discussion of your diagnoses and further evaluation after today's visit; if you do not have a primary care doctor use the resource guide provided to find one; Please return to the ER for any new or worsening symptoms. Please obtain all of your results from medical records or have your doctors office obtain the results - share them with your doctor - you should be seen at your doctors office. Call today to arrange your follow up.   Take medications as prescribed. Please review all of the medicines and only take them if you do not have an allergy to them. Return to the emergency room for worsening condition or new concerning symptoms. Follow up with your regular doctor. If you don't have a regular doctor use one of the numbers below to establish a primary care doctor.  Please be aware that if you are taking birth control pills, taking other prescriptions, ESPECIALLY ANTIBIOTICS may make the birth control ineffective - if this is the case, either do not engage in sexual activity or use alternative methods of birth control such as condoms until you have finished the medicine and your family doctor says it is OK to restart them. If you are on a blood thinner such as COUMADIN, be aware that any other medicine that you take may cause the coumadin to either work too much, or not enough - you should have your coumadin level rechecked in next 7 days if this is the case.  ?  It is also a possibility that you have an allergic reaction to any of the medicines that you have been prescribed - Everybody reacts differently to medications and while MOST people have no  trouble with most medicines, you may have a reaction such as nausea, vomiting, rash, swelling, shortness of breath. If this is the case, please stop taking the medicine immediately and contact your physician.  ?  You should return to the ER if you develop severe or worsening symptoms.   Emergency Department Resource Guide 1) Find a Doctor and Pay Out of Pocket Although you won't have to find out who is covered by your insurance plan, it is a good idea to ask around and get recommendations. You will then need to call the office and see if the doctor you have chosen will accept you as a new patient and what types of options they offer for patients who are self-pay. Some doctors offer discounts or will set up payment plans for their patients who do not have insurance, but you will need to ask so you aren't surprised when you get to your appointment.  2) Contact Your Local Health Department Not all health departments have doctors that can see patients for sick visits, but many do, so it is worth a call to see if yours does. If you don't know where your local health department is, you can check in your phone book. The CDC also has a tool to help you locate your state's health department, and many state websites also have listings of all of their local health departments.  3) Find a Walk-in Clinic If your illness is not likely to be  very severe or complicated, you may want to try a walk in clinic. These are popping up all over the country in pharmacies, drugstores, and shopping centers. They're usually staffed by nurse practitioners or physician assistants that have been trained to treat common illnesses and complaints. They're usually fairly quick and inexpensive. However, if you have serious medical issues or chronic medical problems, these are probably not your best option.  No Primary Care Doctor: Call Health Connect at  [phone] - they can help you locate a primary care doctor that  accepts your  insurance, provides certain services, etc. Physician Referral [phone]- [phone]  Emergency Department Resource Guide 1) Find a Doctor and Pay Out of Pocket Although you won't have to find out who is covered by your insurance plan, it is a good idea to ask around and get recommendations. You will then need to call the office and see if the doctor you have chosen will accept you as a new patient and what types of options they offer for patients who are self-pay. Some doctors offer discounts or will set up payment plans for their patients who do not have insurance, but you will need to ask so you aren't surprised when you get to your appointment.  2) Contact Your Local Health Department Not all health departments have doctors that can see patients for sick visits, but many do, so it is worth a call to see if yours does. If you don't know where your local health department is, you can check in your phone book. The CDC also has a tool to help you locate your state's health department, and many state websites also have listings of all of their local health departments.  3) Find a Walk-in Clinic If your illness is not likely to be very severe or complicated, you may want to try a walk in clinic. These are popping up all over the country in pharmacies, drugstores, and shopping centers. They're usually staffed by nurse practitioners or physician assistants that have been trained to treat common illnesses and complaints. They're usually fairly quick and inexpensive. However, if you have serious medical issues or chronic medical problems, these are probably not your best option.  No Primary Care Doctor: Call Health Connect at  [phone] - they can help you locate a primary care doctor that  accepts your insurance, provides certain services, etc. Physician Referral Service- [phone]  Chronic Pain Problems: Organization         Address  Phone   Notes  [facility]  [phone]  Patients need to be referred by their primary care doctor.   Medication Assistance: Organization         Address  Phone   Notes  [facility] [address] [place], [place] [address] [phone] --Must be a resident of [facility] [place] -- Must have NO insurance coverage whatsoever (no Medicaid/ Medicare, etc.) -- The pt. MUST have a primary care doctor that directs their care regularly and follows them in the community   [facility]  [phone]   [employer]  [phone]    Agencies that provide inexpensive medical care: Organization         Address  Phone   Notes  [facility]  [phone]   [facility]    [phone]   [facility] [address] [place], [place] [address] [phone]   [facility]  [address] [place] [phone]   [facility]    [phone]   [facility]    [phone]   [facility]  [address], [place] Phone:  [phone], Fax:  [phone] Hours of Operation:  9 am - 6 pm, M-F.  Also accepts Medicaid/Medicare and self-pay.  [facility]  [address], [place] Phone: [phone], Fax: [phone]. Hours of Operation:  8:30 am - 5:30 pm, M-F.  Also accepts Medicaid and self-pay.  [facility] [place] [address], [place] Phone: [phone]   [facility] [address] Natasha Bence [place], [place] [phone], Ext. [phone] Mondays & Thursdays: 7-9 AM.  First 15 patients are seen on a first come, first serve basis.    Medicaid-accepting [facility] Providers:  Organization         Address  Phone   Notes  [facility] [address], [place] [phone] Also accepts self-pay patients.  [facility] [address] Laurell Josephs [place], [place]  [phone]   [facility] [address], [place] [phone]   [facility] [address] [place] [phone]   Renaye Rakers [address], [place]   [phone] Only accepts [place] patients after they have their name applied to their card.   Self-Pay (no insurance) in [facility]:  Organization         Address  Phone   Notes  Sickle Cell Patients, [facility] [address] [place], [place] [phone]   [facility] [address] [place], [place] [phone]   [facility]  [address],  [phone]   [facility]/Dr. Osei-Bonsu  [address] [place] or [address], [place] [phone] Phone number for both [place] and [place] locations is the same.  [facility] [address], [place] [phone]   [facility] [address], [place] or [address] [phone]   [facility] [address], [place] [phone], phone; [phone], fax Sees patients 1st and 3rd Saturday of every month.  Must not qualify for public or private insurance (i.e. Medicaid, Medicare, [place] Health Choice, Veterans' Benefits)  Household income should be no more than 200% of the poverty level The clinic cannot treat you if you are pregnant or think you are pregnant  Sexually transmitted diseases are not treated at the clinic.

## 2018-01-27 NOTE — ED Triage Notes (Signed)
Pt reports she has recently had flank pain, saw urologist and diagnosed with UTI. No imaging preformed. She was put on abx, had allergic rxn. Pt states she has had persistent flank pain and vomiting.

## 2018-01-28 LAB — URINE CULTURE: Culture: <10000 — AB

## 2018-05-29 ENCOUNTER — Other Ambulatory Visit: Payer: Self-pay

## 2018-05-29 ENCOUNTER — Encounter (HOSPITAL_COMMUNITY): Payer: Self-pay

## 2018-05-29 ENCOUNTER — Ambulatory Visit (HOSPITAL_COMMUNITY)
Admission: EM | Admit: 2018-05-29 | Discharge: 2018-05-29 | Disposition: A | Discharge status: Home / Self Care | Payer: Medicaid Other | Attending: Family Medicine | Admitting: Family Medicine

## 2018-05-29 DIAGNOSIS — H60392 Other infective otitis externa, left ear: Secondary | ICD-10-CM

## 2018-05-29 MED ORDER — SULFAMETHOXAZOLE-TRIMETHOPRIM 800-160 MG PO TABS: 1.0000 | ORAL_TABLET | Freq: Two times a day (BID) | ORAL | 0 refills | Status: AC

## 2018-05-29 NOTE — Discharge Instructions (Addendum)
Take the antibiotic 2 x a day for 3-5 days Warm compresses to area Call for problems

## 2018-05-29 NOTE — ED Provider Notes (Signed)
[facility]    CSN: [id number] Arrival date & time: 05/29/18  9977     History   Chief Complaint Chief Complaint  Patient presents with   Otalgia    HPI Jordan Hanson is a 23 y.o. female.   HPI  Patient has pain in her left ear for almost a week.  Is getting worse over time.  It is painful if she touches her ear or pulls on the ear.  Hearing is normal. She has no fever.  No runny stuffy nose or sinus symptoms.  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      Home Medications    Prior to Admission medications   Medication Sig Start Date End Date Taking? Authorizing Provider  divalproex (DEPAKOTE ER) 500 MG 24 hr tablet Take 2 tablets (1,000 mg total) by mouth at bedtime. Patient taking differently: Take 500 mg by mouth 2 (two) times daily.  02/13/16   Adonis Brook, NP  QUEtiapine (SEROQUEL) 300 MG tablet Take 1 tablet (300 mg total) by mouth at bedtime. 02/13/16   Adonis Brook, NP  ranitidine (ZANTAC) 150 MG capsule Take 150 mg by mouth 2 (two) times daily as needed for heartburn.    [provider]  sertraline (ZOLOFT) 50 MG tablet Take 3 tablets (150 mg total) by mouth daily. Patient taking differently: Take 100 mg by mouth daily.  02/14/16   Adonis Brook, NP  sulfamethoxazole-trimethoprim (BACTRIM DS) 800-160 MG tablet Take 1 tablet by mouth 2 (two) times daily for 7 days. 05/29/18  06/05/18  Eustace Moore, MD    Family History Family History  Adopted: Yes    Social History Social History   Tobacco Use   Smoking status: Current Every Day Smoker    Packs/day: 0.50    Types: Cigarettes   Smokeless tobacco: Never Used  Substance Use Topics   Alcohol use: No    Comment: occ   Drug use: No    Comment: smokes weed every day     Allergies   Geodon [ziprasidone hcl]; Penicillins; and Tamsulosin   Review of Systems Review of Systems  Constitutional: Negative for chills and fever.  HENT: Positive for ear pain. Negative for congestion, ear discharge, hearing loss and sore throat.   Eyes: Negative for pain and visual disturbance.  Respiratory: Negative for cough and shortness of breath.   Cardiovascular: Negative for chest pain and palpitations.  Gastrointestinal: Negative for abdominal pain and vomiting.  Genitourinary: Negative for dysuria and hematuria.  Musculoskeletal: Negative for arthralgias and back pain.  Skin: Negative for color change and rash.  Neurological: Negative for seizures and syncope.  All other systems reviewed and are negative.    Physical Exam Triage Vital Signs ED Triage Vitals  Enc Vitals Group     BP 05/29/18 0852 127/88     Pulse Rate 05/29/18 0852 87     Resp 05/29/18  0852 18     Temp 05/29/18 0852 98 F (36.7 C)     Temp Source 05/29/18 0852 Oral     SpO2 05/29/18 0852 100 %     Weight --      Height --      Head Circumference --      Peak Flow --      Pain Score 05/29/18 0848 8     Pain Loc --      Pain Edu? --      Excl. in GC? --    No data found.  Updated Vital Signs BP 127/88 (BP Location: Left Arm)    Pulse 87    Temp 98 F (36.7 C) (Oral)    Resp 18    LMP 05/02/2018    SpO2 100%      Physical Exam Constitutional:      General: She is not in acute distress.    Appearance: She is well-developed.  HENT:     Head: Normocephalic and atraumatic.     Right Ear: Tympanic membrane, ear canal and  external ear normal. There is no impacted cerumen.     Left Ear: Tympanic membrane normal. There is no impacted cerumen.     Ears:   Eyes:     Conjunctiva/sclera: Conjunctivae normal.     Pupils: Pupils are equal, round, and reactive to light.  Neck:     Musculoskeletal: Normal range of motion.  Cardiovascular:     Rate and Rhythm: Normal rate.  Pulmonary:     Effort: Pulmonary effort is normal. No respiratory distress.  Abdominal:     General: There is no distension.     Palpations: Abdomen is soft.  Musculoskeletal: Normal range of motion.  Skin:    General: Skin is warm and dry.  Neurological:     Mental Status: She is alert.      UC Treatments / Results  Labs (all labs ordered are listed, but only abnormal results are displayed) Labs Reviewed - No data to display  EKG None  Radiology No results found.  Procedures Procedures (including critical care time)  Medications Ordered in UC Medications - No data to display  Initial Impression / Assessment and Plan / UC Course  I have reviewed the triage vital signs and the nursing notes.  Pertinent labs & imaging results that were available during my care of the patient were reviewed by me and considered in my medical decision making (see chart for details).      Final Clinical Impressions(s) / UC Diagnoses   Final diagnoses:  Other infective acute otitis externa of left ear     Discharge Instructions     Take the antibiotic 2 x a day for 3-5 days Warm compresses to area Call for problems   ED Prescriptions    Medication Sig Dispense Auth. Provider   sulfamethoxazole-trimethoprim (BACTRIM DS) 800-160 MG tablet Take 1 tablet by mouth 2 (two) times daily for 7 days. 14 tablet Eustace MooreNelson, Yidel Teuscher Sue, MD     Controlled Substance Prescriptions [place] Controlled Substance Registry consulted? Not Applicable   Eustace MooreNelson, Rayquan Amrhein Sue, MD 05/29/18 [phone]

## 2018-05-29 NOTE — ED Triage Notes (Signed)
Patient presents to Urgent Care with complaints of left ear infection/pain since almost a week ago. Patient reports it is constant on the left and intermittent on the right.

## 2018-06-09 ENCOUNTER — Ambulatory Visit (HOSPITAL_COMMUNITY)
Admission: EM | Admit: 2018-06-09 | Discharge: 2018-06-09 | Disposition: A | Discharge status: Home / Self Care | Payer: Medicaid Other | Attending: Internal Medicine | Admitting: Internal Medicine

## 2018-06-09 ENCOUNTER — Other Ambulatory Visit: Payer: Self-pay

## 2018-06-09 ENCOUNTER — Encounter (HOSPITAL_COMMUNITY): Payer: Self-pay

## 2018-06-09 DIAGNOSIS — R21 Rash and other nonspecific skin eruption: Secondary | ICD-10-CM

## 2018-06-09 DIAGNOSIS — H6002 Abscess of left external ear: Secondary | ICD-10-CM

## 2018-06-09 MED ORDER — SULFAMETHOXAZOLE-TRIMETHOPRIM 800-160 MG PO TABS: 1.0000 | ORAL_TABLET | Freq: Two times a day (BID) | ORAL | 0 refills | Status: AC

## 2018-06-09 NOTE — Discharge Instructions (Signed)
Re-start the hot compresses like you did last time. I will place you back on antibiotic as last time and take it, so this wont get worse.  Call the ear nose and throat to be seen. Use hydrocortisone 1% cream twice a day on the dry skin are on the external right ear.

## 2018-06-09 NOTE — ED Provider Notes (Signed)
[facility]    CSN: [id number] Arrival date & time: 06/09/18  1601     History   Chief Complaint Chief Complaint  Patient presents with  . Appointment    [phone]  . Otalgia    HPI Jordan Hanson is a 23 y.o. female. who present due to having recurrence of L ear abscess 3-4 days after finishing taking bactrim on 5/21. She also has developed a rash on the external L ear region. She denies fever, chills, URI symptoms, or drainage from her L ear. Denies itching or rash on her body.   Past Medical History:  Diagnosis Date  . ADHD (attention deficit hyperactivity disorder)   . Bipolar disorder (HCC)   . Depression     Patient Active Problem List   Diagnosis Date Noted  . Borderline personality disorder (HCC) 02/13/2016  . Bipolar 1 disorder, depressed (HCC) 02/10/2016  . Suicidal ideation 07/19/2014  . 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 MDD (major depressive disorder) 06/17/2014    Past Surgical History:  Procedure Laterality Date  . TONSILLECTOMY      OB History   No obstetric history on file.      Home Medications    Prior to Admission medications   Medication Sig Start Date End Date Taking? Authorizing Provider  sulfamethoxazole-trimethoprim (BACTRIM DS) 800-160 MG tablet Take 1 tablet by mouth 2 (two) times daily for 7 days. 06/09/18 06/16/18  Rodriguez-Southworth, Nettie Elm, PA-C    Family History Family History  Adopted: Yes    Social History Social History   Tobacco Use  . Smoking status: Current Every Day Smoker    Packs/day: 0.50    Types: Cigarettes  . Smokeless tobacco: Never Used  Substance Use Topics  . Alcohol use: No    Comment: occ  . Drug use: No    Comment: smokes weed every day     Allergies   Geodon [ziprasidone hcl]; Penicillins; and Tamsulosin   Review of Systems Review of Systems  Constitutional: Negative for chills and fever.  HENT: Positive for ear pain and  hearing loss. Negative for ear discharge, rhinorrhea, sinus pain, sore throat, tinnitus and trouble swallowing.   Eyes: Negative for discharge.  Respiratory: Negative for cough and shortness of breath.   Musculoskeletal: Negative for gait problem.  Skin: Negative for color change and rash.  Neurological: Negative for dizziness.  Hematological: Negative for adenopathy.     Physical Exam Triage Vital Signs ED Triage Vitals  Enc Vitals Group     BP 06/09/18 1614 105/73     Pulse Rate 06/09/18 1614 82     Resp 06/09/18 1614 16     Temp 06/09/18 1614 99.9 F (37.7 C)     Temp Source 06/09/18 1614 Oral     SpO2 06/09/18 1614 100 %     Weight 06/09/18 1612 138 lb (62.6 kg)     Height --      Head Circumference --      Peak Flow --      Pain Score 06/09/18 1612 5     Pain Loc --      Pain Edu? --      Excl. in GC? --    No data found.  Updated Vital Signs BP 105/73 (BP Location: Right Arm)   Pulse 82   Temp 99.9 F (37.7 C) (Oral)   Resp 16   Wt 138 lb (62.6 kg)   LMP 06/08/2018  SpO2 100%   BMI 22.96 kg/m   Visual Acuity Right Eye Distance:   Left Eye Distance:   Bilateral Distance:    Right Eye Near:   Left Eye Near:    Bilateral Near:     Physical Exam Vitals signs and nursing note reviewed.  Constitutional:      General: She is not in acute distress.    Appearance: Normal appearance. She is not ill-appearing.  HENT:     Head: Normocephalic.     Right Ear: Tympanic membrane and ear canal normal. There is no impacted cerumen.     Left Ear: Tympanic membrane normal. There is no impacted cerumen.     Ears:     Comments: L anterior ear canal with mild swelling where she had the abscess last time, but is not red. It is tender to palpation. The opening of the canal is 50% open.  Has slight redness and flaky skin on the external ear right before the entrance of the R ear canal.     Nose: Nose normal.     Mouth/Throat:     Mouth: Mucous membranes are moist.   Eyes:     General: No scleral icterus.       Right eye: No discharge.        Left eye: No discharge.     Conjunctiva/sclera: Conjunctivae normal.  Neck:     Musculoskeletal: Neck supple. No muscular tenderness.  Pulmonary:     Effort: Pulmonary effort is normal.  Musculoskeletal: Normal range of motion.  Lymphadenopathy:     Cervical: No cervical adenopathy.  Skin:    General: Skin is warm and dry.     Findings: Rash present.  Neurological:     General: No focal deficit present.     Mental Status: She is alert and oriented to person, place, and time.     Gait: Gait normal.  Psychiatric:        Mood and Affect: Mood normal.        Behavior: Behavior normal.        Thought Content: Thought content normal.        Judgment: Judgment normal.      UC Treatments / Results  Labs (all labs ordered are listed, but only abnormal results are displayed) Labs Reviewed - No data to display  EKG None  Radiology No results found.  Procedures Procedures  Medications Ordered in UC Medications - No data to display  Initial Impression / Assessment and Plan / UC Course  I have reviewed the triage vital signs and the nursing notes.  Final Clinical Impressions(s) / UC Diagnoses   Final diagnoses:  Abscess, ear canal, left  Rash and nonspecific skin eruption    ED Prescriptions    Medication Sig Dispense Auth. Provider   sulfamethoxazole-trimethoprim (BACTRIM DS) 800-160 MG tablet Take 1 tablet by mouth 2 (two) times daily for 7 days. 14 tablet Rodriguez-Southworth, Nettie ElmSylvia, PA-C     Controlled Substance Prescriptions [place] Controlled Substance Registry consulted?    Garey HamRodriguez-Southworth, Jamari Diana, PA-C 06/09/18 2018

## 2018-06-09 NOTE — ED Triage Notes (Signed)
Pt left ear pain and swelling. Pt states her right ear has a rash inside. X 2 weeks.

## 2018-08-23 ENCOUNTER — Ambulatory Visit (HOSPITAL_COMMUNITY)
Admission: EM | Admit: 2018-08-23 | Discharge: 2018-08-23 | Disposition: A | Discharge status: Home / Self Care | Payer: Medicaid Other | Attending: Family Medicine | Admitting: Family Medicine

## 2018-08-23 ENCOUNTER — Encounter (HOSPITAL_COMMUNITY): Payer: Self-pay | Admitting: Emergency Medicine

## 2018-08-23 ENCOUNTER — Other Ambulatory Visit: Payer: Self-pay

## 2018-08-23 DIAGNOSIS — K625 Hemorrhage of anus and rectum: Secondary | ICD-10-CM

## 2018-08-23 NOTE — Discharge Instructions (Signed)
You have been seen today for rectal bleeding. Your evaluation was not suggestive of any emergent condition requiring medical intervention at this time. However, some problems make take more time to appear. Therefore, it is very important for you to pay attention to any new symptoms or worsening of your current condition.  Please return here or to the Emergency Department immediately should you begin to feel worse in any way or have any of the following symptoms: worsening bleeding, abdominal pain, persistent vomiting, inability to drink fluids, fevers, or shaking chills.

## 2018-08-23 NOTE — ED Triage Notes (Signed)
Patient had period type cramps on Thursday.  Thursday night vomited once. Friday was fine Patient had a normal bowel movement this morning-no blood.   Patient did place a tampon, no blood on tampon Patient noticed blood on towel she used to clean rectum with

## 2018-08-25 NOTE — ED Provider Notes (Signed)
[facility]   [id number] 08/23/18 Arrival Time: 1425  ASSESSMENT & PLAN:  1. Rectal bleeding     Benign abdominal/rectal exam. No indications for urgent abdominal/pelvic imaging at this time. Discussed possibility of internal hemorrhoid. Discussed.    Discharge Instructions     You have been seen today for rectal bleeding. Your evaluation was not suggestive of any emergent condition requiring medical intervention at this time. However, some problems make take more time to appear. Therefore, it is very important for you to pay attention to any new symptoms or worsening of your current condition.  Please return here or to the Emergency Department immediately should you begin to feel worse in any way or have any of the following symptoms: worsening bleeding, abdominal pain, persistent vomiting, inability to drink fluids, fevers, or shaking chills.      Recommend: Follow-up Information    Schedule an appointment as soon as possible for a visit  with Gastroenterology, Deboraha [facility].   Contact information: [address] [place] [id number] [phone]           Reviewed expectations re: course of current medical issues. Questions answered. Outlined signs and symptoms indicating need for more acute intervention. Patient verbalized understanding. After Visit Summary given.   SUBJECTIVE: History from: patient. Jordan Hanson is a 23 y.o. female who presents with complaint of noticing bright red blood on toilet tissue and in toilet water after BM today. Patient's last menstrual period was 08/03/2018. Periods usually regular. Placed tampon out of questioning if bleeding from vagina. Tampon removed was clean. Did have some abdominal cramping a few days. No BM since seeing blood. No abdominal or pelvic pain. No n/v. No specific aggravating or alleviating factors reported. She denies arthralgias, belching, chills, constipation, diarrhea, headache and sweats. Appetite:  normal. PO intake: normal. Ambulatory without assistance. Urinary symptoms: none. No rectal/anal injury reported. History of similar: no. OTC treatment: none.  Patient's last menstrual period was 08/03/2018. Past Surgical History:  Procedure Laterality Date  . TONSILLECTOMY      ROS: As per HPI. All other systems negative.  OBJECTIVE:  Vitals:   08/23/18 1446  BP: 115/74  Pulse: 87  Resp: 18  SpO2: 99%    General appearance: alert, oriented, no acute distress Lungs: clear to auscultation bilaterally; unlabored respirations Heart: regular rate and rhythm Abdomen: soft; without distention; no tenderness; normal bowel sounds; without masses or organomegaly; without guarding or rebound tenderness Rectal: no pain with exam; no obvious masses or hemorrhoids; no gross bleeding appreciated Back: without CVA tenderness; FROM at waist Extremities: without LE edema; symmetrical; without gross deformities Skin: warm and dry Neurologic: normal gait Psychological: alert and cooperative; normal mood and affect   Allergies  Allergen Reactions  . Geodon [Ziprasidone Hcl]   . Penicillins Swelling and Rash    Has patient had a PCN reaction causing immediate rash, facial/tongue/throat swelling, SOB or lightheadedness with hypotension:YES Has patient had a PCN reaction causing severe rash involving mucus membranes or skin necrosis: NO Has patient had a PCN reaction that required hospitalization NO Has patient had a PCN reaction occurring within the last 10 years: NO If all of the above answers are "NO", then may proceed with Cephalosporin use.   . Tamsulosin Rash  Past Medical History:  Diagnosis Date  . ADHD (attention deficit hyperactivity disorder)   . Bipolar disorder ([place])   . Depression    Social History   Socioeconomic History  . Marital status: Significant Other    Spouse name: Not on file  . Number of children: Not on file  .  Years of education: Not on file  . Highest education level: Not on file  Occupational History  . Not on file  Social Needs  . Financial resource strain: Not on file  . Food insecurity    Worry: Not on file    Inability: Not on file  . Transportation needs    Medical: Not on file    Non-medical: Not on file  Tobacco Use  . Smoking status: Current Every Day Smoker    Packs/day: 0.50    Types: Cigarettes  . Smokeless tobacco: Never Used  Substance and Sexual Activity  . Alcohol use: No    Comment: occ  . Drug use: No    Comment: smokes weed every day  . Sexual activity: Not on file  Lifestyle  . Physical activity    Days per week: Not on file    Minutes per session: Not on file  . Stress: Not on file  Relationships  . Social Herbalist on phone: Not on file    Gets together: Not on file    Attends religious service: Not on file    Active member of club or organization: Not on file    Attends meetings of clubs or organizations: Not on file    Relationship status: Not on file  . Intimate partner violence    Fear of current or ex partner: Not on file    Emotionally abused: Not on file    Physically abused: Not on file    Forced sexual activity: Not on file  Other Topics Concern  . Not on file  Social History Narrative  . Not on file   Family History  Adopted: Jerre Simon, MD 08/25/18 [phone]

## 2018-08-31 ENCOUNTER — Encounter (HOSPITAL_COMMUNITY): Payer: Self-pay

## 2018-08-31 ENCOUNTER — Ambulatory Visit (HOSPITAL_COMMUNITY)
Admission: EM | Admit: 2018-08-31 | Discharge: 2018-08-31 | Disposition: A | Discharge status: Home / Self Care | Payer: Medicaid Other | Attending: Family Medicine | Admitting: Family Medicine

## 2018-08-31 ENCOUNTER — Other Ambulatory Visit: Payer: Self-pay

## 2018-08-31 DIAGNOSIS — M791 Myalgia, unspecified site: Secondary | ICD-10-CM | POA: Diagnosis not present

## 2018-08-31 MED ORDER — CYCLOBENZAPRINE HCL 5 MG PO TABS: 5.0000 mg | ORAL_TABLET | Freq: Two times a day (BID) | ORAL | 0 refills | Status: DC | PRN

## 2018-08-31 NOTE — Discharge Instructions (Addendum)
Take muscle relaxer as needed for severe pain, spasm. °May ice, rest, elevate area is causing most pain.  Can also use hot compresses/warm wash rags to relieve muscle tightness. °May use OTC Tylenol, ibuprofen as needed for pain. °Return if you develop worsening pain, chest pain, difficulty breathing. °
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
## 2018-09-24 ENCOUNTER — Encounter (HOSPITAL_COMMUNITY): Payer: Self-pay | Admitting: Emergency Medicine

## 2018-09-24 ENCOUNTER — Emergency Department (HOSPITAL_COMMUNITY)
Admission: EM | Admit: 2018-09-24 | Discharge: 2018-09-24 | Disposition: A | Discharge status: Home / Self Care | Payer: Medicaid Other | Attending: Emergency Medicine | Admitting: Emergency Medicine

## 2018-09-24 ENCOUNTER — Emergency Department (HOSPITAL_COMMUNITY): Payer: Medicaid Other

## 2018-09-24 DIAGNOSIS — W06XXXD Fall from bed, subsequent encounter: Secondary | ICD-10-CM | POA: Diagnosis not present

## 2018-09-24 DIAGNOSIS — M25561 Pain in right knee: Secondary | ICD-10-CM

## 2018-09-24 DIAGNOSIS — F17210 Nicotine dependence, cigarettes, uncomplicated: Secondary | ICD-10-CM | POA: Diagnosis not present

## 2018-09-24 MED ORDER — MELOXICAM 15 MG PO TABS: 15.0000 mg | ORAL_TABLET | Freq: Every day | ORAL | 0 refills | Status: AC
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
## 2018-09-24 NOTE — Discharge Instructions (Signed)
Please read and follow all provided instructions.  Your diagnoses today include:  1. Acute pain of right knee     Tests performed today include:  An x-ray of the affected area - does NOT show any broken bones  Vital signs. See below for your results today.   Medications prescribed:   Meloxicam - anti-inflammatory pain medication  You have been prescribed an anti-inflammatory medication or NSAID. Take with food. Do not take aspirin, ibuprofen, or naproxen if taking this medication. Take smallest effective dose for the shortest duration needed for your pain. Stop taking if you experience stomach pain or vomiting.   Take any prescribed medications only as directed.  Home care instructions:   Follow any educational materials contained in this packet  Follow R.I.C.E. Protocol:  R - rest your injury   I  - use ice on injury without applying directly to skin  C - compress injury with bandage or splint  E - elevate the injury as much as possible  Follow-up instructions: Please follow-up with your primary care provider or the provided orthopedic physician (bone specialist) if you continue to have significant pain in 1 week. In this case you may have a more severe injury that requires further care.   Return instructions:   Please return if your toes or feet are numb or tingling, appear gray or blue, or you have severe pain (also elevate the leg and loosen splint or wrap if you were given one)  Please return to the Emergency Department if you experience worsening symptoms.   Please return if you have any other emergent concerns.  Additional Information:  Your vital signs today were: BP 133/79 (BP Location: Right Arm)    Pulse (!) 105    Temp 99.2 F (37.3 C) (Oral)    Resp 18    SpO2 100%  If your blood pressure (BP) was elevated above 135/85 this visit, please have this repeated by your doctor within one month. --------------
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
## 2018-09-30 ENCOUNTER — Other Ambulatory Visit: Payer: Self-pay

## 2018-09-30 ENCOUNTER — Encounter: Payer: Self-pay | Admitting: Orthopaedic Surgery

## 2018-09-30 ENCOUNTER — Ambulatory Visit (INDEPENDENT_AMBULATORY_CARE_PROVIDER_SITE_OTHER): Payer: Medicaid Other | Admitting: Orthopaedic Surgery

## 2018-09-30 DIAGNOSIS — M25561 Pain in right knee: Secondary | ICD-10-CM

## 2018-09-30 DIAGNOSIS — G8929 Other chronic pain: Secondary | ICD-10-CM

## 2018-09-30 MED ORDER — TRAMADOL HCL 50 MG PO TABS: 50.0000 mg | ORAL_TABLET | Freq: Four times a day (QID) | ORAL | 1 refills | Status: DC | PRN
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
## 2018-10-14 ENCOUNTER — Ambulatory Visit: Payer: Medicaid Other | Admitting: Orthopaedic Surgery

## 2018-10-19 ENCOUNTER — Ambulatory Visit
Admission: RE | Admit: 2018-10-19 | Discharge: 2018-10-19 | Disposition: A | Discharge status: Home / Self Care | Payer: Medicaid Other | Source: Ambulatory Visit | Attending: Physician Assistant | Admitting: Physician Assistant

## 2018-10-19 ENCOUNTER — Other Ambulatory Visit: Payer: Self-pay

## 2018-10-19 DIAGNOSIS — G8929 Other chronic pain: Secondary | ICD-10-CM

## 2018-10-20 NOTE — Progress Notes (Signed)
F/u to discuss

## 2018-10-21 ENCOUNTER — Encounter: Payer: Self-pay | Admitting: Orthopaedic Surgery

## 2018-10-21 ENCOUNTER — Other Ambulatory Visit: Payer: Self-pay

## 2018-10-21 ENCOUNTER — Ambulatory Visit (INDEPENDENT_AMBULATORY_CARE_PROVIDER_SITE_OTHER): Payer: Medicaid Other | Admitting: Orthopaedic Surgery

## 2018-10-21 DIAGNOSIS — M25561 Pain in right knee: Secondary | ICD-10-CM

## 2018-10-21 DIAGNOSIS — G8929 Other chronic pain: Secondary | ICD-10-CM

## 2018-10-21 MED ORDER — BUPIVACAINE HCL 0.25 % IJ SOLN
2.0000 mL | INTRAMUSCULAR | Status: AC | PRN
  Administered 2018-10-21: 2 mL via INTRA_ARTICULAR

## 2018-10-21 MED ORDER — LIDOCAINE HCL 1 % IJ SOLN
2.0000 mL | INTRAMUSCULAR | Status: AC | PRN
  Administered 2018-10-21: 2 mL

## 2018-10-21 MED ORDER — METHYLPREDNISOLONE ACETATE 40 MG/ML IJ SUSP
40.0000 mg | INTRAMUSCULAR | Status: AC | PRN
  Administered 2018-10-21: 40 mg via INTRA_ARTICULAR
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
## 2018-11-10 ENCOUNTER — Other Ambulatory Visit: Payer: Self-pay

## 2018-11-10 ENCOUNTER — Ambulatory Visit (HOSPITAL_COMMUNITY)
Admission: EM | Admit: 2018-11-10 | Discharge: 2018-11-10 | Disposition: A | Discharge status: Home / Self Care | Payer: Medicaid Other | Attending: Emergency Medicine | Admitting: Emergency Medicine

## 2018-11-10 ENCOUNTER — Encounter (HOSPITAL_COMMUNITY): Payer: Self-pay

## 2018-11-10 DIAGNOSIS — Z888 Allergy status to other drugs, medicaments and biological substances status: Secondary | ICD-10-CM | POA: Insufficient documentation

## 2018-11-10 DIAGNOSIS — Z915 Personal history of self-harm: Secondary | ICD-10-CM | POA: Insufficient documentation

## 2018-11-10 DIAGNOSIS — K529 Noninfective gastroenteritis and colitis, unspecified: Secondary | ICD-10-CM | POA: Diagnosis not present

## 2018-11-10 DIAGNOSIS — Z880 Allergy status to penicillin: Secondary | ICD-10-CM | POA: Diagnosis not present

## 2018-11-10 DIAGNOSIS — Z79899 Other long term (current) drug therapy: Secondary | ICD-10-CM | POA: Insufficient documentation

## 2018-11-10 DIAGNOSIS — Z791 Long term (current) use of non-steroidal anti-inflammatories (NSAID): Secondary | ICD-10-CM | POA: Diagnosis not present

## 2018-11-10 DIAGNOSIS — Z20828 Contact with and (suspected) exposure to other viral communicable diseases: Secondary | ICD-10-CM | POA: Diagnosis not present

## 2018-11-10 DIAGNOSIS — F319 Bipolar disorder, unspecified: Secondary | ICD-10-CM | POA: Insufficient documentation

## 2018-11-10 DIAGNOSIS — F17210 Nicotine dependence, cigarettes, uncomplicated: Secondary | ICD-10-CM | POA: Diagnosis not present

## 2018-11-10 MED ORDER — ONDANSETRON HCL 4 MG PO TABS: 4.0000 mg | ORAL_TABLET | Freq: Four times a day (QID) | ORAL | 0 refills | Status: DC

## 2018-11-10 NOTE — Discharge Instructions (Addendum)
Take the Zofran as needed for your nausea and vomiting.  Keep yourself hydrated with clear liquids such as Sprite or ginger ale.    Your COVID test is pending.  You should self quarantine until your test result is back and is negative.    Go to the emergency department if you develop high fever, shortness of breath, severe vomiting/diarrhea, or other concerning symptoms.

## 2018-11-10 NOTE — ED Triage Notes (Signed)
Pt presents with nausea, vomiting, and diarrhea since yesterday.  Pt states she works at [employer] where [employer] are housed.

## 2018-11-10 NOTE — ED Provider Notes (Signed)
[facility]    CSN: [id number] Arrival date & time: 11/10/18  1206      History   Chief Complaint Chief Complaint  Patient presents with  . Appointment  . (12:10) Nausea, Vomiting ,& Diarrhea    HPI Jordan Hanson is a 23 y.o. female.   Patient presents with nausea, vomiting, diarrhea x1 day.  She reports 2 episodes of diarrhea today but no emesis.  She has ongoing nausea.  She has been able to eat and drink without difficulty.  She denies fever, chills, rash, sore throat, cough, shortness of breath, abdominal pain, or other symptoms.  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  No treatments attempted at home.  The history is provided by the patient.    Past Medical History:  Diagnosis Date  . ADHD (attention deficit hyperactivity disorder)   . Bipolar disorder ([facility])   . Depression     Patient Active Problem List   Diagnosis Date Noted  . Borderline personality disorder ([facility]) 02/13/2016  . Bipolar 1 disorder, depressed ([facility]) 02/10/2016  . Suicidal ideation 07/19/2014  . 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 MDD (major depressive disorder) 06/17/2014    Past Surgical History:  Procedure Laterality Date  . TONSILLECTOMY      OB History   No obstetric history on file.      Home Medications    Prior to Admission medications   Medication Sig Start Date End Date Taking? Authorizing Provider  cyclobenzaprine (FLEXERIL) 5 MG tablet Take 1 tablet (5 mg total) by mouth 2 (two) times daily as needed for muscle spasms. 08/31/18   Hall-Potvin, [place], PA-C  naproxen sodium (ALEVE) 220 MG tablet Take 220 mg by mouth.    [provider]  ondansetron (ZOFRAN) 4 MG tablet Take 1 tablet (4 mg total) by mouth every 6 (six) hours. 11/10/18   Mickie Bailate, Sami Roes H, NP  traMADol (ULTRAM) 50 MG tablet Take 1 tablet (50 mg total) by mouth every 6 (six) hours as needed. 09/30/18    Cristie HemStanbery, Mary L, PA-C    Family History Family History  Adopted: Yes    Social History Social History   Tobacco Use  . Smoking status: Current Every Day Smoker    Packs/day: 0.50    Types: Cigarettes  . Smokeless tobacco: Never Used  Substance Use Topics  . Alcohol use: No    Comment: occ  . Drug use: No    Comment: smokes weed every day     Allergies   Geodon [ziprasidone hcl], Penicillins, and Tamsulosin   Review of Systems Review of Systems  Constitutional: Negative for chills and fever.  HENT: Negative for ear pain and sore throat.   Eyes: Negative for pain and visual disturbance.  Respiratory: Negative for cough and shortness of breath.   Cardiovascular: Negative for chest pain and palpitations.  Gastrointestinal: Positive for diarrhea, nausea and vomiting. Negative for abdominal pain.  Genitourinary: Negative for dysuria and hematuria.  Musculoskeletal: Negative for arthralgias and back pain.  Skin: Negative for color change and rash.  Neurological: Negative for seizures and syncope.  All other systems reviewed and are negative.    Physical Exam Triage Vital Signs ED Triage Vitals  Enc Vitals Group     BP      Pulse      Resp      Temp      Temp src  SpO2      Weight      Height      Head Circumference      Peak Flow      Pain Score      Pain Loc      Pain Edu?      Excl. in GC?    No data found.  Updated Vital Signs BP 123/75 (BP Location: Left Arm)   Pulse 93   Temp 98.1 F (36.7 C) (Oral)   Resp 17   LMP 10/23/2018   SpO2 100%   Visual Acuity Right Eye Distance:   Left Eye Distance:   Bilateral Distance:    Right Eye Near:   Left Eye Near:    Bilateral Near:     Physical Exam Vitals signs and nursing note reviewed.  Constitutional:      General: She is not in acute distress.    Appearance: She is well-developed. She is not ill-appearing.  HENT:     Head: Normocephalic and atraumatic.     Right Ear: Tympanic  membrane normal.     Left Ear: Tympanic membrane normal.     Nose: Nose normal.     Mouth/Throat:     Mouth: Mucous membranes are moist.     Pharynx: Oropharynx is clear.  Eyes:     Conjunctiva/sclera: Conjunctivae normal.  Neck:     Musculoskeletal: Neck supple.  Cardiovascular:     Rate and Rhythm: Normal rate and regular rhythm.     Heart sounds: No murmur.  Pulmonary:     Effort: Pulmonary effort is normal. No respiratory distress.     Breath sounds: Normal breath sounds.  Abdominal:     General: Bowel sounds are normal.     Palpations: Abdomen is soft.     Tenderness: There is no abdominal tenderness. There is no right CVA tenderness, left CVA tenderness, guarding or rebound.  Skin:    General: Skin is warm and dry.     Findings: No rash.  Neurological:     Mental Status: She is alert.      UC Treatments / Results  Labs (all labs ordered are listed, but only abnormal results are displayed) Labs Reviewed  NOVEL CORONAVIRUS, NAA (HOSP ORDER, SEND-OUT TO REF LAB; TAT 18-24 HRS)    EKG   Radiology No results found.  Procedures Procedures (including critical care time)  Medications Ordered in UC Medications - No data to display  Initial Impression / Assessment and Plan / UC Course  I have reviewed the triage vital signs and the nursing notes.  Pertinent labs & imaging results that were available during my care of the patient were reviewed by me and considered in my medical decision making (see chart for details).    Gastroenteritis.  Treating with Zofran.  Instructed patient to stay hydrated with clear liquids.  COVID test performed here.  Instructed patient to self quarantine until the test result is back.  Instructed patient to go to the emergency department if she develops high fever, shortness of breath, severe vomiting/diarrhea, or other concerning symptoms.  Patient agrees with plan of care.     Final Clinical Impressions(s) / UC Diagnoses   Final  diagnoses:  Gastroenteritis     Discharge Instructions     Take the Zofran as needed for your nausea and vomiting.  Keep yourself hydrated with clear liquids such as Sprite or ginger ale.    Your COVID test is pending.  You should self quarantine  until your test result is back and is negative.    Go to the emergency department if you develop high fever, shortness of breath, severe vomiting/diarrhea, or other concerning symptoms.       ED Prescriptions    Medication Sig Dispense Auth. Provider   ondansetron (ZOFRAN) 4 MG tablet Take 1 tablet (4 mg total) by mouth every 6 (six) hours. 12 tablet Sharion Balloon, NP     PDMP not reviewed this encounter.   Sharion Balloon, NP 11/10/18 1255

## 2018-11-12 LAB — NOVEL CORONAVIRUS, NAA (HOSP ORDER, SEND-OUT TO REF LAB; TAT 18-24 HRS): SARS-CoV-2, NAA: NOT DETECTED

## 2018-12-24 ENCOUNTER — Ambulatory Visit: Payer: Medicaid Other | Admitting: Orthopaedic Surgery

## 2018-12-30 ENCOUNTER — Ambulatory Visit: Payer: Medicaid Other | Admitting: Orthopaedic Surgery

## 2019-01-31 ENCOUNTER — Encounter (HOSPITAL_COMMUNITY): Payer: Self-pay

## 2019-01-31 ENCOUNTER — Other Ambulatory Visit: Payer: Self-pay

## 2019-01-31 ENCOUNTER — Emergency Department (HOSPITAL_COMMUNITY): Payer: Medicaid Other

## 2019-01-31 ENCOUNTER — Emergency Department (HOSPITAL_COMMUNITY)
Admission: EM | Admit: 2019-01-31 | Discharge: 2019-02-01 | Disposition: A | Discharge status: Home / Self Care | Payer: Medicaid Other | Attending: Emergency Medicine | Admitting: Emergency Medicine

## 2019-01-31 DIAGNOSIS — F1210 Cannabis abuse, uncomplicated: Secondary | ICD-10-CM | POA: Insufficient documentation

## 2019-01-31 DIAGNOSIS — F17210 Nicotine dependence, cigarettes, uncomplicated: Secondary | ICD-10-CM | POA: Insufficient documentation

## 2019-01-31 DIAGNOSIS — Z79899 Other long term (current) drug therapy: Secondary | ICD-10-CM | POA: Insufficient documentation

## 2019-01-31 DIAGNOSIS — M542 Cervicalgia: Secondary | ICD-10-CM | POA: Insufficient documentation

## 2019-01-31 DIAGNOSIS — J029 Acute pharyngitis, unspecified: Secondary | ICD-10-CM | POA: Diagnosis present

## 2019-01-31 LAB — BASIC METABOLIC PANEL
Anion gap: 10 (ref 5–15)
BUN: 6 mg/dL (ref 6–20)
CO2: 21 mmol/L — ABNORMAL LOW (ref 22–32)
Calcium: 9.6 mg/dL (ref 8.9–10.3)
Chloride: 105 mmol/L (ref 98–111)
Creatinine, Ser: 0.51 mg/dL (ref 0.44–1.00)
GFR calc Af Amer: >60 mL/min (ref >60)
GFR calc non Af Amer: >60 mL/min (ref >60)
Glucose, Bld: 84 mg/dL (ref 70–99)
Potassium: 4 mmol/L (ref 3.5–5.1)
Sodium: 136 mmol/L (ref 135–145)

## 2019-01-31 LAB — CBC WITH DIFFERENTIAL/PLATELET
Abs Immature Granulocytes: 0.03 10*3/uL (ref 0.00–0.07)
Basophils Absolute: 0.1 10*3/uL (ref 0.0–0.1)
Basophils Relative: 1 %
Eosinophils Absolute: 0 10*3/uL (ref 0.0–0.5)
Eosinophils Relative: 0 %
HCT: 39.5 % (ref 36.0–46.0)
Hemoglobin: 13.3 g/dL (ref 12.0–15.0)
Immature Granulocytes: 0 %
Lymphocytes Relative: 37 %
Lymphs Abs: 3.3 10*3/uL (ref 0.7–4.0)
MCH: 29.5 pg (ref 26.0–34.0)
MCHC: 33.7 g/dL (ref 30.0–36.0)
MCV: 87.6 fL (ref 80.0–100.0)
Monocytes Absolute: 0.7 10*3/uL (ref 0.1–1.0)
Monocytes Relative: 8 %
Neutro Abs: 4.9 10*3/uL (ref 1.7–7.7)
Neutrophils Relative %: 54 %
Platelets: 262 10*3/uL (ref 150–400)
RBC: 4.51 MIL/uL (ref 3.87–5.11)
RDW: 12.1 % (ref 11.5–15.5)
WBC: 9.1 10*3/uL (ref 4.0–10.5)
nRBC: 0 % (ref 0.0–0.2)

## 2019-01-31 LAB — GROUP A STREP BY PCR: Group A Strep by PCR: NOT DETECTED

## 2019-01-31 LAB — MONONUCLEOSIS SCREEN: Mono Screen: NEGATIVE

## 2019-01-31 MED ORDER — IOHEXOL 300 MG/ML  SOLN
75.0000 mL | Freq: Once | INTRAMUSCULAR | Status: AC | PRN
  Administered 2019-01-31: 75 mL via INTRAVENOUS

## 2019-01-31 NOTE — ED Triage Notes (Signed)
Pt arrived POV C/O of sore throat and neck pain. Pt states she has had cold symptoms x2 weeks with no relief from dayquil. Pt states she noticed 2 days ago her throat started hurting and became scratchy along with 2 little bumps on her neck that hurt to touch.

## 2019-01-31 NOTE — ED Provider Notes (Signed)
[facility] EMERGENCY DEPARTMENT Provider Note   CSN: [id number] Arrival date & time: 01/31/19  1806     History Chief Complaint  Patient presents with  . Sore Throat  . Neck Pain    Jordan Hanson is a 24 y.o. female with PMHx ADHD, bipolar disorder, and depression who presents to the ED today complaining of gradual onset, constant, achy, worsening, 10/10, sore throat x 2 weeks.  Reports she has been taking DayQuil without relief.  She states that 2 days ago she noticed a knot to the left side of her neck which is very tender to the touch.  This concerned her.  She states she is still able to tolerate liquids but states it is somewhat difficult due to the pain. Pt believes her voice sounds more muffled. He is denying any recent COVID-19 positive exposure.  She is denying any fevers or chills however she states she has not been checking her temperature at home.  Denies abdominal pain, nausea, vomiting, drooling, trismus, any other associated symptoms.  The history is provided by the patient.       Past Medical History:  Diagnosis Date  . ADHD (attention deficit hyperactivity disorder)   . Bipolar disorder ([place])   . Depression     Patient Active Problem List   Diagnosis Date Noted  . Borderline personality disorder ([place]) 02/13/2016  . Bipolar 1 disorder, depressed ([place]) 02/10/2016  . Suicidal ideation 07/19/2014  . MDD (major depressive disorder), recurrent severe, without psychosis ([place]) 06/19/2014  . MDD (major depressive disorder) 06/17/2014    Past Surgical History:  Procedure Laterality Date  . TONSILLECTOMY       OB History   No obstetric history on file.     Family History  Adopted: Yes    Social History   Tobacco Use  . Smoking status: Current Every Day Smoker    Packs/day: 0.50    Types: Cigarettes  . Smokeless tobacco: Never Used  Substance Use Topics  . Alcohol use: No    Comment: occ  . Drug use: No    Comment: smokes weed  every day    Home Medications Prior to Admission medications   Medication Sig Start Date End Date Taking? Authorizing Provider  cyclobenzaprine (FLEXERIL) 5 MG tablet Take 1 tablet (5 mg total) by mouth 2 (two) times daily as needed for muscle spasms. 08/31/18   Hall-Potvin, [place], PA-C  naproxen sodium (ALEVE) 220 MG tablet Take 220 mg by mouth.    [provider]  ondansetron (ZOFRAN) 4 MG tablet Take 1 tablet (4 mg total) by mouth every 6 (six) hours. 11/10/18   Sharion Balloon, NP  traMADol (ULTRAM) 50 MG tablet Take 1 tablet (50 mg total) by mouth every 6 (six) hours as needed. 09/30/18   Aundra Dubin, PA-C    Allergies    Geodon [ziprasidone hcl], Penicillins, and Tamsulosin  Review of Systems   Review of Systems  Constitutional: Negative for chills and fever.  HENT: Positive for sore throat. Negative for drooling, ear pain, trouble swallowing and voice change.   Respiratory: Negative for cough and shortness of breath.   Gastrointestinal: Negative for abdominal pain, diarrhea, nausea and vomiting.  Musculoskeletal: Positive for neck pain.  All other systems reviewed and are negative.   Physical Exam Updated Vital Signs BP 126/80 (BP Location: Right Arm)   Pulse (!) 103   Temp 98.8 F (37.1 C) (Oral)   Resp 14   SpO2 98%  Physical Exam Vitals and nursing note reviewed.  Constitutional:      Appearance: She is not ill-appearing.  HENT:     Head: Normocephalic and atraumatic.     Right Ear: Tympanic membrane normal.     Left Ear: Tympanic membrane normal.     Mouth/Throat:     Pharynx: Uvula midline. Pharyngeal swelling and posterior oropharyngeal erythema present. No oropharyngeal exudate.  Eyes:     Conjunctiva/sclera: Conjunctivae normal.  Cardiovascular:     Rate and Rhythm: Normal rate and regular rhythm.     Heart sounds: Normal heart sounds.  Pulmonary:     Effort: Pulmonary effort is normal.     Breath sounds: Normal breath sounds. No  wheezing, rhonchi or rales.  Abdominal:     Palpations: Abdomen is soft.     Tenderness: There is no abdominal tenderness.  Musculoskeletal:     Cervical back: Neck supple.  Lymphadenopathy:     Cervical: Cervical adenopathy present.     Left cervical: Posterior cervical adenopathy present.  Skin:    General: Skin is warm and dry.  Neurological:     Mental Status: She is alert.     ED Results / Procedures / Treatments   Labs (all labs ordered are listed, but only abnormal results are displayed) Labs Reviewed  BASIC METABOLIC PANEL - Abnormal; Notable for the following components:      Result Value   CO2 21 (*)    All other components within normal limits  GROUP A STREP BY PCR  MONONUCLEOSIS SCREEN  CBC WITH DIFFERENTIAL/PLATELET    EKG None  Radiology CT Soft Tissue Neck W Contrast  Result Date: 01/31/2019 CLINICAL DATA:  Left-sided lumps in throat. Difficulty swallowing. EXAM: CT NECK WITH CONTRAST TECHNIQUE: Multidetector CT imaging of the neck was performed using the standard protocol following the bolus administration of intravenous contrast. CONTRAST:  64mL OMNIPAQUE IOHEXOL 300 MG/ML  SOLN COMPARISON:  None. FINDINGS: PHARYNX AND LARYNX: --Nasopharynx: Fossae of [place]er are clear. Normal adenoid tonsils for age. --Oral cavity and oropharynx: The palatine and lingual tonsils are normal. The visible oral cavity and floor of mouth are normal. --Hypopharynx: Normal vallecula and pyriform sinuses. --Larynx: Normal epiglottis and pre-epiglottic space. Normal aryepiglottic and vocal folds. --Retropharyngeal space: No abscess, effusion or lymphadenopathy. SALIVARY GLANDS: --Parotid: No mass lesion or inflammation. No sialolithiasis or ductal dilatation. --Submandibular: Symmetric without inflammation. No sialolithiasis or ductal dilatation. --Sublingual: Normal. No ranula or other visible lesion of the base of tongue and floor of mouth. THYROID: Normal. LYMPH NODES: No enlarged  or abnormal density lymph nodes. VASCULAR: Major cervical vessels are patent. LIMITED INTRACRANIAL: Normal. VISUALIZED ORBITS: Normal. MASTOIDS AND VISUALIZED PARANASAL SINUSES: No fluid levels or advanced mucosal thickening. No mastoid effusion. SKELETON: No bony spinal canal stenosis. No lytic or blastic lesions. UPPER CHEST: Clear. OTHER: None. IMPRESSION: Normal CT of the neck. Electronically Signed   By: Deatra Robinson M.D.   On: 01/31/2019 23:17    Procedures Procedures (including critical care time)  Medications Ordered in ED Medications  iohexol (OMNIPAQUE) 300 MG/ML solution 75 mL (75 mLs Intravenous Contrast Given 01/31/19 2242)    ED Course  I have reviewed the triage vital signs and the nursing notes.  Pertinent labs & imaging results that were available during my care of the patient were reviewed by me and considered in my medical decision making (see chart for details).  24 year old female who presents to the ED today with complaint of sore throat and neck  pain for the past 2 weeks, noticed a knot to the left side of her neck 2 days ago which concerned her.  Patient has had tonsillectomy in the past.  On exam today she has erythema to the tonsils however uvula is midline.  She does have a posterior cervical lymph node on the left side that is tender to palpation however mobile.  On arrival to the ED patient is afebrile, nontachycardic and nontachypneic.  She is denying any recent COVID-19 positive exposure.  Given her only complaint is sore throat I will swab for strep as well as mono given it has been 2 weeks.  Will obtain screening labs at this time.  Patient states that she is still able to tolerate liquids states she feels like she has to take small sips that she feel like something is stuck in the back of her throat and it is difficult for her to swallow.  She is tolerating her secretions without difficulty.  It does sound mildly muffled today however does not appear to be at the  point of hot potato voice.  Will swab for mono and strep and get screening labs.  If no acute findings may consider CT scan of soft tissues of neck to rule out deep space infections.   Strep negative. Mono negative. No leukocytosis on CBC. No electrolyte abnormalities on BMP.  Order CT scan at this time to rule out deep space infection.  CT scan negative.  Have offered Covid test for patient however this will not change patient's treatment course.  She has already been having symptoms for 2 weeks.  She does not want a Covid test at this time.  She is advised to take ibuprofen and Tylenol as needed for her pain.  Advised to eat soft foods to help with discomfort of throat.  Advised to use cough drops over-the-counter for relief.  Advised to follow-up with her PCP.  She does not currently have 1, have advised she can follow-up with [facility] for primary care needs.  Return precautions have been discussed.  Patient is in agreement with plan and stable for discharge home.    MDM Rules/Calculators/A&P                       Final Clinical Impression(s) / ED Diagnoses Final diagnoses:  Sore throat  Viral pharyngitis    Rx / DC Orders ED Discharge Orders    None       Discharge Instructions     Your labwork and imaging were reassuring today. You tested negative for both strep and mono. Your CT scan did not show any signs of deep space infections.   Please continue taking Ibuprofen and Tylenol as needed for your pain. Cough drops can also help soothe sore throats.   Follow up with your PCP. If you do not have one you can follow up with [facility] for your primary care needs.   Return to the ED for any worsening symptoms including worsening sore throat, inability to tolerate fluids, drooling on yourself, or any other symptoms.        Tanda Rockers, PA-C 01/31/19 [place]    Blane Ohara, MD 01/31/19 [phone]

## 2019-01-31 NOTE — ED Notes (Signed)
Pt transported to CT ?

## 2019-01-31 NOTE — Discharge Instructions (Signed)
Your labwork and imaging were reassuring today. You tested negative for both strep and mono. Your CT scan did not show any signs of deep space infections.   Please continue taking Ibuprofen and Tylenol as needed for your pain. Cough drops can also help soothe sore throats.   Follow up with your PCP. If you do not have one you can follow up with [facility] for your primary care needs.   Return to the ED for any worsening symptoms including worsening sore throat, inability to tolerate fluids, drooling on yourself, or any other symptoms.

## 2019-04-21 ENCOUNTER — Emergency Department (HOSPITAL_COMMUNITY)
Admission: EM | Admit: 2019-04-21 | Discharge: 2019-04-21 | Disposition: A | Discharge status: Home / Self Care | Payer: Medicaid Other | Attending: Emergency Medicine | Admitting: Emergency Medicine

## 2019-04-21 ENCOUNTER — Emergency Department (HOSPITAL_COMMUNITY): Payer: Medicaid Other

## 2019-04-21 ENCOUNTER — Encounter (HOSPITAL_COMMUNITY): Payer: Self-pay | Admitting: Emergency Medicine

## 2019-04-21 DIAGNOSIS — R630 Anorexia: Secondary | ICD-10-CM | POA: Diagnosis not present

## 2019-04-21 DIAGNOSIS — F909 Attention-deficit hyperactivity disorder, unspecified type: Secondary | ICD-10-CM | POA: Diagnosis not present

## 2019-04-21 DIAGNOSIS — R197 Diarrhea, unspecified: Secondary | ICD-10-CM | POA: Insufficient documentation

## 2019-04-21 DIAGNOSIS — F17210 Nicotine dependence, cigarettes, uncomplicated: Secondary | ICD-10-CM | POA: Diagnosis not present

## 2019-04-21 DIAGNOSIS — F319 Bipolar disorder, unspecified: Secondary | ICD-10-CM | POA: Insufficient documentation

## 2019-04-21 DIAGNOSIS — R110 Nausea: Secondary | ICD-10-CM | POA: Diagnosis not present

## 2019-04-21 DIAGNOSIS — R1031 Right lower quadrant pain: Secondary | ICD-10-CM | POA: Diagnosis present

## 2019-04-21 LAB — CBC
HCT: 40.1 % (ref 36.0–46.0)
Hemoglobin: 12.9 g/dL (ref 12.0–15.0)
MCH: 28.6 pg (ref 26.0–34.0)
MCHC: 32.2 g/dL (ref 30.0–36.0)
MCV: 88.9 fL (ref 80.0–100.0)
Platelets: 288 10*3/uL (ref 150–400)
RBC: 4.51 MIL/uL (ref 3.87–5.11)
RDW: 12.6 % (ref 11.5–15.5)
WBC: 8.4 10*3/uL (ref 4.0–10.5)
nRBC: 0 % (ref 0.0–0.2)

## 2019-04-21 LAB — COMPREHENSIVE METABOLIC PANEL
ALT: 24 U/L (ref 0–44)
AST: 23 U/L (ref 15–41)
Albumin: 4.1 g/dL (ref 3.5–5.0)
Alkaline Phosphatase: 42 U/L (ref 38–126)
Anion gap: 9 (ref 5–15)
BUN: 9 mg/dL (ref 6–20)
CO2: 22 mmol/L (ref 22–32)
Calcium: 9.4 mg/dL (ref 8.9–10.3)
Chloride: 107 mmol/L (ref 98–111)
Creatinine, Ser: 0.58 mg/dL (ref 0.44–1.00)
GFR calc Af Amer: >60 mL/min (ref >60)
GFR calc non Af Amer: >60 mL/min (ref >60)
Glucose, Bld: 85 mg/dL (ref 70–99)
Potassium: 3.6 mmol/L (ref 3.5–5.1)
Sodium: 138 mmol/L (ref 135–145)
Total Bilirubin: 0.5 mg/dL (ref 0.3–1.2)
Total Protein: 6.7 g/dL (ref 6.5–8.1)

## 2019-04-21 LAB — URINALYSIS, ROUTINE W REFLEX MICROSCOPIC
Bilirubin Urine: NEGATIVE
Glucose, UA: NEGATIVE mg/dL
Ketones, ur: NEGATIVE mg/dL
Nitrite: NEGATIVE
Protein, ur: NEGATIVE mg/dL
Specific Gravity, Urine: 1.004 — ABNORMAL LOW (ref 1.005–1.030)
pH: 7 (ref 5.0–8.0)

## 2019-04-21 LAB — I-STAT BETA HCG BLOOD, ED (MC, WL, AP ONLY): I-stat hCG, quantitative: <5 m[IU]/mL (ref <5)

## 2019-04-21 LAB — LIPASE, BLOOD: Lipase: 25 U/L (ref 11–51)

## 2019-04-21 MED ORDER — SODIUM CHLORIDE 0.9 % IV BOLUS
1000.0000 mL | Freq: Once | INTRAVENOUS | Status: AC
  Administered 2019-04-21: 1000 mL via INTRAVENOUS

## 2019-04-21 MED ORDER — SODIUM CHLORIDE 0.9% FLUSH: 3.0000 mL | Freq: Once | INTRAVENOUS | Status: DC

## 2019-04-21 MED ORDER — ONDANSETRON HCL 4 MG/2ML IJ SOLN
4.0000 mg | Freq: Once | INTRAMUSCULAR | Status: AC
  Administered 2019-04-21: 4 mg via INTRAVENOUS
  Filled 2019-04-21: qty 2

## 2019-04-21 MED ORDER — IOHEXOL 300 MG/ML  SOLN
80.0000 mL | Freq: Once | INTRAMUSCULAR | Status: AC | PRN
  Administered 2019-04-21: 80 mL via INTRAVENOUS

## 2019-04-21 MED ORDER — MORPHINE SULFATE (PF) 4 MG/ML IV SOLN
4.0000 mg | Freq: Once | INTRAVENOUS | Status: AC
  Administered 2019-04-21: 4 mg via INTRAVENOUS
  Filled 2019-04-21: qty 1

## 2019-04-21 NOTE — ED Provider Notes (Signed)
[facility] EMERGENCY DEPARTMENT Provider Note   CSN: [id number] Arrival date & time: 04/21/19  1041     History Chief Complaint  Patient presents with  . Abdominal Pain    Jordan Hanson is a 24 y.o. female.  24yo female presents with complaint of aching/sharp right lower quadrant abdominal pain onset this morning upon waking. Pain initially periumbilical, now RLQ, constant, progressively worsening, worse with walking/movement. Associated with nausea, loss of appetite, and non bloody diarrhea. Denies changes in bladder habits, fevers, chills, vaginal discharge. No history of ovarian cysts, no prior abdominal surgeries. No other complaints or concerns.         Past Medical History:  Diagnosis Date  . ADHD (attention deficit hyperactivity disorder)   . Bipolar disorder (HCC)   . Depression     Patient Active Problem List   Diagnosis Date Noted  . Borderline personality disorder (HCC) 02/13/2016  . Bipolar 1 disorder, depressed (HCC) 02/10/2016  . Suicidal ideation 07/19/2014  . 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 MDD (major depressive disorder) 06/17/2014    Past Surgical History:  Procedure Laterality Date  . TONSILLECTOMY       OB History   No obstetric history on file.     Family History  Adopted: Yes    Social History   Tobacco Use  . Smoking status: Current Every Day Smoker    Packs/day: 0.50    Types: Cigarettes  . Smokeless tobacco: Never Used  Substance Use Topics  . Alcohol use: No    Comment: occ  . Drug use: No    Comment: smokes weed every day    Home Medications Prior to Admission medications   Medication Sig Start Date End Date Taking? Authorizing Provider  Acetaminophen (MIDOL PO) Take 2 tablets by mouth daily as needed (pain).   Yes [provider]  hydrOXYzine (ATARAX/VISTARIL) 25 MG tablet Take 25 mg by mouth at bedtime as needed for anxiety. 03/27/19   Yes [provider]  ibuprofen (ADVIL) 200 MG tablet Take 800 mg by mouth every 6 (six) hours as needed for moderate pain.   Yes [provider]  VRAYLAR capsule Take 1.5 mg by mouth daily. 03/28/19  Yes [provider]    Allergies    Geodon [ziprasidone hcl], Penicillins, and Tamsulosin  Review of Systems   Review of Systems  Constitutional: Negative for chills, diaphoresis and fever.  Gastrointestinal: Positive for abdominal pain, diarrhea and nausea. Negative for blood in stool, constipation and vomiting.  Genitourinary: Negative for dysuria, frequency, vaginal bleeding and vaginal discharge.  Musculoskeletal: Negative for arthralgias, back pain and myalgias.  Skin: Negative for rash and wound.  Allergic/Immunologic: Negative for immunocompromised state.  Neurological: Negative for weakness.  Hematological: Negative for adenopathy.  Psychiatric/Behavioral: Negative for confusion.  All other systems reviewed and are negative.   Physical Exam Updated Vital Signs BP (!) 101/58   Pulse 66   Temp 97.6 F (36.4 C)   Resp 18   SpO2 100%   Physical Exam Vitals and nursing note reviewed.  Constitutional:      General: She is not in acute distress.    Appearance: She is well-developed. She is not diaphoretic.  HENT:     Head: Normocephalic and atraumatic.  Cardiovascular:     Rate and Rhythm: Normal rate and regular rhythm.     Heart sounds: Normal heart sounds.  Pulmonary:     Effort: Pulmonary effort is  normal.  Abdominal:     General: Abdomen is flat.     Palpations: Abdomen is soft.     Tenderness: There is abdominal tenderness in the right lower quadrant. There is guarding and rebound. There is no right CVA tenderness or left CVA tenderness. Positive signs include McBurney's sign. Negative signs include psoas sign and obturator sign.  Skin:    General: Skin is warm and dry.     Findings: No erythema or rash.  Neurological:     Mental Status:  She is alert and oriented to person, place, and time.  Psychiatric:        Behavior: Behavior normal.     ED Results / Procedures / Treatments   Labs (all labs ordered are listed, but only abnormal results are displayed) Labs Reviewed  URINALYSIS, ROUTINE W REFLEX MICROSCOPIC - Abnormal; Notable for the following components:      Result Value   Color, Urine STRAW (*)    Specific Gravity, Urine 1.004 (*)    Hgb urine dipstick SMALL (*)    Leukocytes,Ua TRACE (*)    Bacteria, UA RARE (*)    All other components within normal limits  LIPASE, BLOOD  COMPREHENSIVE METABOLIC PANEL  CBC  I-STAT BETA HCG BLOOD, ED ([facility], [facility], [facility] ONLY)    EKG None  Radiology CT Abdomen Pelvis W Contrast  Result Date: 04/21/2019 CLINICAL DATA:  Right lower quadrant abdominal pain and nausea EXAM: CT ABDOMEN AND PELVIS WITH CONTRAST TECHNIQUE: Multidetector CT imaging of the abdomen and pelvis was performed using the standard protocol following bolus administration of intravenous contrast. CONTRAST:  48mL OMNIPAQUE IOHEXOL 300 MG/ML  SOLN COMPARISON:  CT abdomen pelvis 12/26/2017 FINDINGS: Lower chest: Lung bases are clear. Normal heart size. No pericardial effusion. Hepatobiliary: No focal liver abnormality is seen. No gallstones, gallbladder wall thickening, or biliary dilatation. Pancreas: Unremarkable. No pancreatic ductal dilatation or surrounding inflammatory changes. Spleen: Normal in size without focal abnormality. Adrenals/Urinary Tract: Adrenal glands are unremarkable. Kidneys are normal, without renal calculi, focal lesion, or hydronephrosis. Bladder is unremarkable. Stomach/Bowel: Distal esophagus, stomach and duodenal sweep are unremarkable. No small bowel wall thickening or dilatation. No evidence of obstruction. Normal appendix in the right lower quadrant, coronal images 42-55. No colonic dilatation or wall thickening. Scattered colonic diverticula without focal pericolonic inflammation to suggest  diverticulitis. Vascular/Lymphatic: The aorta is normal caliber. No suspicious or enlarged lymph nodes in the included lymphatic chains. Reproductive: Normal anteverted uterus. 2.2 cm dominant follicle in the right ovary. No concerning adnexal lesions. Other: Trace low-attenuation fluid in the deep pelvis, may be physiologic in a reproductive age female. Musculoskeletal: No acute osseous abnormality or suspicious osseous lesion. Bilateral L5 pars defects are similar to prior with grade 1 anterolisthesis L5 on S1. IMPRESSION: 1. Trace low-attenuation fluid in the deep pelvis, may be physiologic in a reproductive age female. 2. No other acute abnormality in the abdomen or pelvis to provide cause for patient's right lower quadrant pain. Normal appendix. 3. 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 Electronically Signed   By: Kreg Shropshire M.D.   On: 04/21/2019 15:28    Procedures Procedures (including critical care time)  Medications Ordered in ED Medications  sodium chloride flush (NS) 0.9 % injection 3 mL (has no administration in time range)  sodium chloride 0.9 % bolus 1,000 mL (0 mLs Intravenous Stopped 04/21/19 1606)  morphine 4 MG/ML injection 4 mg (4 mg Intravenous Given 04/21/19 1415)  ondansetron (ZOFRAN) injection  4 mg (4 mg Intravenous Given 04/21/19 1415)  iohexol (OMNIPAQUE) 300 MG/ML solution 80 mL (80 mLs Intravenous Contrast Given 04/21/19 1503)    ED Course  I have reviewed the triage vital signs and the nursing notes.  Pertinent labs & imaging results that were available during my care of the patient were reviewed by me and considered in my medical decision making (see chart for details).  Clinical Course as of Apr 21 1623  Tue Apr 21, 8015  [id number] 24 year old female with complaint of periumbilical to right lower quadrant abdominal pain onset this morning, associated with nausea and diarrhea as well as lack of appetite.  On exam patient has tenderness location  right lower quadrant with slight rebound and guarding.  Psoas, negative obturator.  Exam and history concerning for possible appendicitis, CT abdomen pelvis ordered, appendix is visualized and is normal.  Labs reassuring with normal CMP, CBC, lipase.  hCG is negative.  Urinalysis positive for trace leukocytes with rare bacteria, denies dysuria.  Discussed findings with patient, discussed possibility of early appendicitis, recommend return to ER for fevers, worsening pain or other concerns otherwise should have recheck with PCP the next 24 hours.   [LM]    Clinical Course User Index [LM] Roque Lias   MDM Rules/Calculators/A&P                      Final Clinical Impression(s) / ED Diagnoses Final diagnoses:  Right lower quadrant abdominal pain    Rx / DC Orders ED Discharge Orders    None       Tacy Learn, PA-C 04/21/19 [place], DO 04/22/19 1501

## 2019-04-21 NOTE — ED Notes (Signed)
IV rt AC removed

## 2019-04-21 NOTE — ED Triage Notes (Signed)
Pt arrives to ED with c/o of lower right quadrant pain with diarrhea and nausea. LMP 2 weeks ago.

## 2019-04-21 NOTE — Discharge Instructions (Signed)
Your labs today are reassuring.  Your CT scan today shows a normal appendix and no concerning ovarian cysts. Recommend recheck with your primary care provider in the next 24 hours, return to ER for worsening pain, fever, other concerning symptoms.

## 2019-04-21 NOTE — ED Notes (Signed)
Patient verbalizes understanding of discharge instructions. Opportunity for questioning and answers were provided. Armband removed by staff, pt discharged from ED.  

## 2019-04-22 ENCOUNTER — Telehealth (HOSPITAL_COMMUNITY): Payer: Self-pay

## 2019-04-22 NOTE — Telephone Encounter (Signed)
Pt. Called and was seen in our ED last night, 04/21/2019 for lower abdominal pain.  She had a CT scan done and wanted to know if it showed any problems with her Gall bladder.  I informed her that  The Ct scan did not show any problems with her gallbladder.  She reports still having rt. Lower abdominal pain and it has not improved.  I encouraged pt. To return to the ED.  Pt. Verbalized understanding.  All questions answered,.

## 2019-04-23 ENCOUNTER — Ambulatory Visit (HOSPITAL_COMMUNITY)
Admission: EM | Admit: 2019-04-23 | Discharge: 2019-04-23 | Disposition: A | Discharge status: Home / Self Care | Payer: Medicaid Other | Attending: Urgent Care | Admitting: Urgent Care

## 2019-04-23 ENCOUNTER — Encounter (HOSPITAL_COMMUNITY): Payer: Self-pay

## 2019-04-23 ENCOUNTER — Other Ambulatory Visit: Payer: Self-pay

## 2019-04-23 DIAGNOSIS — R109 Unspecified abdominal pain: Secondary | ICD-10-CM | POA: Insufficient documentation

## 2019-04-23 DIAGNOSIS — R1031 Right lower quadrant pain: Secondary | ICD-10-CM | POA: Diagnosis present

## 2019-04-23 LAB — POCT URINALYSIS DIP (DEVICE)
Bilirubin Urine: NEGATIVE
Bilirubin Urine: NEGATIVE
Glucose, UA: NEGATIVE mg/dL
Glucose, UA: NEGATIVE mg/dL
Ketones, ur: NEGATIVE mg/dL
Ketones, ur: NEGATIVE mg/dL
Leukocytes,Ua: NEGATIVE
Leukocytes,Ua: NEGATIVE
Nitrite: NEGATIVE
Nitrite: NEGATIVE
Protein, ur: NEGATIVE mg/dL
Protein, ur: NEGATIVE mg/dL
Specific Gravity, Urine: 1.01 (ref 1.005–1.030)
Specific Gravity, Urine: 1.02 (ref 1.005–1.030)
Urobilinogen, UA: 0.2 mg/dL (ref 0.0–1.0)
Urobilinogen, UA: 0.2 mg/dL (ref 0.0–1.0)
pH: 7.5 (ref 5.0–8.0)
pH: 7 (ref 5.0–8.0)

## 2019-04-23 MED ORDER — TRAMADOL HCL 50 MG PO TABS: 50.0000 mg | ORAL_TABLET | Freq: Four times a day (QID) | ORAL | 0 refills | Status: DC | PRN

## 2019-04-23 MED ORDER — NAPROXEN 500 MG PO TABS: 500.0000 mg | ORAL_TABLET | Freq: Two times a day (BID) | ORAL | 0 refills | Status: DC

## 2019-04-23 NOTE — ED Provider Notes (Signed)
[facility]   MRN: [id number] DOB: [birth year]  Subjective:   Jordan Hanson is a 24 y.o. female presenting for ongoing right-sided abdominal pain, right lower quadrant pain.  Patient has fairly constant moderate pain with intermittent severe pains.  Eating food worsens her symptoms and can make her nauseous at times.  She did have a visit in the emergency room 2 days ago with complete work-up.  CT scan was positive for scattered diverticula but no diverticulitis.  Patient is adopted and does not know her family history.  Discussed possibility of STIs and she insists that this is not the case but was ultimately agreeable to STI testing.  Patient has had a history of constipation but is defecating normally, last bowel movement was 2 days ago, was soft. Does not look at her stools and therefore does not know if there is any blood. She just set up an appt for establishing care with a new PCP.   No current facility-administered medications for this encounter.  Current Outpatient Medications:  .  Acetaminophen (MIDOL PO), Take 2 tablets by mouth daily as needed (pain)., Disp: , Rfl:  .  hydrOXYzine (ATARAX/VISTARIL) 25 MG tablet, Take 25 mg by mouth at bedtime as needed for anxiety., Disp: , Rfl:  .  ibuprofen (ADVIL) 200 MG tablet, Take 800 mg by mouth every 6 (six) hours as needed for moderate pain., Disp: , Rfl:  .  VRAYLAR capsule, Take 1.5 mg by mouth daily., Disp: , Rfl:    Allergies  Allergen Reactions  . Geodon [Ziprasidone Hcl]   . Penicillins Swelling and Rash    Has patient had a PCN reaction causing immediate rash, facial/tongue/throat swelling, SOB or lightheadedness with hypotension:YES Has patient had a PCN reaction causing severe rash involving mucus membranes or skin necrosis: NO Has patient had a PCN reaction that required hospitalization NO Has patient had a PCN reaction occurring within the last 10 years: NO If all of the above answers are "NO", then may proceed with  Cephalosporin use.   . Tamsulosin Rash    Past Medical History:  Diagnosis Date  . ADHD (attention deficit hyperactivity disorder)   . Bipolar disorder (HCC)   . Depression      Past Surgical History:  Procedure Laterality Date  . TONSILLECTOMY      Family History  Adopted: Yes    Social History   Tobacco Use  . Smoking status: Current Every Day Smoker    Packs/day: 0.50    Types: Cigarettes  . Smokeless tobacco: Never Used  Substance Use Topics  . Alcohol use: No    Comment: occ  . Drug use: No    Comment: smokes weed every day    Review of Systems  Constitutional: Negative for fever and malaise/fatigue.  HENT: Negative for congestion, ear pain, sinus pain and sore throat.   Eyes: Negative for blurred vision, double vision, discharge and redness.  Respiratory: Negative for cough, hemoptysis, shortness of breath and wheezing.   Cardiovascular: Negative for chest pain.  Gastrointestinal: Positive for abdominal pain, constipation and nausea. Negative for diarrhea and vomiting.  Genitourinary: Negative for dysuria, flank pain, frequency, hematuria and urgency.  Musculoskeletal: Negative for myalgias.  Skin: Negative for rash.  Neurological: Negative for dizziness, weakness and headaches.  Psychiatric/Behavioral: Negative for depression and substance abuse.     Objective:   Vitals: BP 119/81 (BP Location: Right Arm)   Pulse 88   Temp 98.3 F (36.8 C) (Oral)   Resp 16  Wt 146 lb 6.4 oz (66.4 kg)   LMP 04/09/2019   SpO2 100%   BMI 24.36 kg/m   Physical Exam Constitutional:      General: She is not in acute distress.    Appearance: Normal appearance. She is well-developed and normal weight. She is not ill-appearing, toxic-appearing or diaphoretic.  HENT:     Head: Normocephalic and atraumatic.     Right Ear: External ear normal.     Left Ear: External ear normal.     Nose: Nose normal.     Mouth/Throat:     Mouth: Mucous membranes are moist.      Pharynx: Oropharynx is clear.  Eyes:     General: No scleral icterus.    Extraocular Movements: Extraocular movements intact.     Pupils: Pupils are equal, round, and reactive to light.  Cardiovascular:     Rate and Rhythm: Normal rate and regular rhythm.     Heart sounds: Normal heart sounds. No murmur. No friction rub. No gallop.   Pulmonary:     Effort: Pulmonary effort is normal. No respiratory distress.     Breath sounds: Normal breath sounds. No stridor. No wheezing, rhonchi or rales.  Abdominal:     General: Bowel sounds are normal. There is no distension.     Palpations: Abdomen is soft. There is no mass.     Tenderness: There is abdominal tenderness (and right flank side) in the right lower quadrant, periumbilical area and suprapubic area. There is no right CVA tenderness, left CVA tenderness, guarding or rebound. Negative signs include Murphy's sign and Rovsing's sign.  Skin:    General: Skin is warm and dry.     Coloration: Skin is not pale.     Findings: No rash.  Neurological:     General: No focal deficit present.     Mental Status: She is alert and oriented to person, place, and time.  Psychiatric:        Mood and Affect: Mood normal.        Behavior: Behavior normal.        Thought Content: Thought content normal.        Judgment: Judgment normal.     Results for orders placed or performed during the hospital encounter of 04/23/19 (from the past 24 hour(s))  POCT urinalysis dip (device)     Status: Abnormal   Collection Time: 04/23/19 12:25 PM  Result Value Ref Range   Glucose, UA NEGATIVE NEGATIVE mg/dL   Bilirubin Urine NEGATIVE NEGATIVE   Ketones, ur NEGATIVE NEGATIVE mg/dL   Specific Gravity, Urine 1.010 1.005 - 1.030   Hgb urine dipstick TRACE (A) NEGATIVE   pH 7.5 5.0 - 8.0   Protein, ur NEGATIVE NEGATIVE mg/dL   Urobilinogen, UA 0.2 0.0 - 1.0 mg/dL   Nitrite NEGATIVE NEGATIVE   Leukocytes,Ua NEGATIVE NEGATIVE  POCT urinalysis dip (device)     Status:  Abnormal   Collection Time: 04/23/19 12:34 PM  Result Value Ref Range   Glucose, UA NEGATIVE NEGATIVE mg/dL   Bilirubin Urine NEGATIVE NEGATIVE   Ketones, ur NEGATIVE NEGATIVE mg/dL   Specific Gravity, Urine 1.020 1.005 - 1.030   Hgb urine dipstick TRACE (A) NEGATIVE   pH 7.0 5.0 - 8.0   Protein, ur NEGATIVE NEGATIVE mg/dL   Urobilinogen, UA 0.2 0.0 - 1.0 mg/dL   Nitrite NEGATIVE NEGATIVE   Leukocytes,Ua NEGATIVE NEGATIVE   Recent Results (from the past 2160 hour(s))  Mononucleosis screen  Status: None   Collection Time: 01/31/19  8:00 PM  Result Value Ref Range   Mono Screen NEGATIVE NEGATIVE    Comment: Performed at [facility], [place] [address] [place], [place] [address]  Basic metabolic panel     Status: Abnormal   Collection Time: 01/31/19  8:00 PM  Result Value Ref Range   Sodium 136 135 - 145 mmol/L   Potassium 4.0 3.5 - 5.1 mmol/L   Chloride 105 98 - 111 mmol/L   CO2 21 (L) 22 - 32 mmol/L   Glucose, Bld 84 70 - 99 mg/dL   BUN 6 6 - 20 mg/dL   Creatinine, Ser 0.51 0.44 - 1.00 mg/dL   Calcium 9.6 8.9 - 10.3 mg/dL   GFR calc non Af Amer >60 >60 mL/min   GFR calc Af Amer >60 >60 mL/min   Anion gap 10 5 - 15    Comment: Performed at [facility], [place] [address] [place], [place] [address]  CBC with Differential     Status: None   Collection Time: 01/31/19  8:00 PM  Result Value Ref Range   WBC 9.1 4.0 - 10.5 K/uL   RBC 4.51 3.87 - 5.11 MIL/uL   Hemoglobin 13.3 12.0 - 15.0 g/dL   HCT 39.5 36.0 - 46.0 %   MCV 87.6 80.0 - 100.0 fL   MCH 29.5 26.0 - 34.0 pg   MCHC 33.7 30.0 - 36.0 g/dL   RDW 12.1 11.5 - 15.5 %   Platelets 262 150 - 400 K/uL   nRBC 0.0 0.0 - 0.2 %   Neutrophils Relative % 54 %   Neutro Abs 4.9 1.7 - 7.7 K/uL   Lymphocytes Relative 37 %   Lymphs Abs 3.3 0.7 - 4.0 K/uL   Monocytes Relative 8 %   Monocytes Absolute 0.7 0.1 - 1.0 K/uL   Eosinophils Relative 0 %   Eosinophils Absolute 0.0 0.0 - 0.5 K/uL   Basophils Relative 1 %    Basophils Absolute 0.1 0.0 - 0.1 K/uL   Immature Granulocytes 0 %   Abs Immature Granulocytes 0.03 0.00 - 0.07 K/uL    Comment: Performed at [facility], [address] [place], [place] [address]  Group A Strep by PCR     Status: None   Collection Time: 01/31/19  8:35 PM   Specimen: Throat; Sterile Swab  Result Value Ref Range   Group A Strep by PCR NOT DETECTED NOT DETECTED    Comment: Performed at [facility] [address] [place], [place] [address]  Lipase, blood     Status: None   Collection Time: 04/21/19 10:59 AM  Result Value Ref Range   Lipase 25 11 - 51 U/L    Comment: Performed at [facility] [address] [place], [place] [address]  Comprehensive metabolic panel     Status: None   Collection Time: 04/21/19 10:59 AM  Result Value Ref Range   Sodium 138 135 - 145 mmol/L   Potassium 3.6 3.5 - 5.1 mmol/L   Chloride 107 98 - 111 mmol/L   CO2 22 22 - 32 mmol/L   Glucose, Bld 85 70 - 99 mg/dL    Comment: Glucose reference range applies only to samples taken after fasting for at least 8 hours.   BUN 9 6 - 20 mg/dL   Creatinine, Ser 0.58 0.44 - 1.00 mg/dL   Calcium 9.4 8.9 - 10.3 mg/dL   Total  Protein 6.7 6.5 - 8.1 g/dL   Albumin 4.1 3.5 - 5.0 g/dL   AST 23 15 - 41 U/L   ALT 24 0 - 44 U/L   Alkaline Phosphatase 42 38 - 126 U/L   Total Bilirubin 0.5 0.3 - 1.2 mg/dL   GFR calc non Af Amer >60 >60 mL/min   GFR calc Af Amer >60 >60 mL/min   Anion gap 9 5 - 15    Comment: Performed at [facility], [address], [place], [place] [address]  CBC     Status: None   Collection Time: 04/21/19 10:59 AM  Result Value Ref Range   WBC 8.4 4.0 - 10.5 K/uL   RBC 4.51 3.87 - 5.11 MIL/uL   Hemoglobin 12.9 12.0 - 15.0 g/dL   HCT 62.8 31.5 - 17.6 %   MCV 88.9 80.0 - 100.0 fL   MCH 28.6 26.0 - 34.0 pg   MCHC 32.2 30.0 - 36.0 g/dL   RDW 16.0 73.7 - 10.6 %   Platelets 288 150 - 400 K/uL   nRBC 0.0 0.0 - 0.2 %    Comment: Performed at [facility], [address] [place], [place] [address]  I-Stat beta hCG blood, ED     Status: None   Collection Time: 04/21/19 11:03 AM  Result Value Ref Range   I-stat hCG, quantitative <5.0 <5 mIU/mL   Comment 3            Comment:   GEST. AGE      CONC.  (mIU/mL)   <=1 WEEK        5 - 50     2 WEEKS       50 - 500     3 WEEKS       100 - 10,000     4 WEEKS     1,000 - 30,000        FEMALE AND NON-PREGNANT FEMALE:     LESS THAN 5 mIU/mL   Urinalysis, Routine w reflex microscopic     Status: Abnormal   Collection Time: 04/21/19  1:15 PM  Result Value Ref Range   Color, Urine STRAW (A) YELLOW   APPearance CLEAR CLEAR   Specific Gravity, Urine 1.004 (L) 1.005 - 1.030   pH 7.0 5.0 - 8.0   Glucose, UA NEGATIVE NEGATIVE mg/dL   Hgb urine dipstick SMALL (A) NEGATIVE   Bilirubin Urine NEGATIVE NEGATIVE   Ketones, ur NEGATIVE NEGATIVE mg/dL   Protein, ur NEGATIVE NEGATIVE mg/dL   Nitrite NEGATIVE NEGATIVE   Leukocytes,Ua TRACE (A) NEGATIVE   RBC / HPF 0-5 0 - 5 RBC/hpf   WBC, UA 0-5 0 - 5 WBC/hpf   Bacteria, UA RARE (A) NONE SEEN   Squamous Epithelial / LPF 0-5 0 - 5    Comment: Performed at [facility], [address], [place], [place] [address]  POCT urinalysis dip (device)     Status: Abnormal   Collection Time: 04/23/19 12:25 PM  Result Value Ref Range   Glucose, UA NEGATIVE NEGATIVE mg/dL   Bilirubin Urine NEGATIVE NEGATIVE   Ketones, ur NEGATIVE NEGATIVE mg/dL   Specific Gravity, Urine 1.010 1.005 - 1.030   Hgb urine dipstick TRACE (A) NEGATIVE   pH 7.5 5.0 - 8.0   Protein, ur NEGATIVE NEGATIVE mg/dL   Urobilinogen, UA 0.2 0.0 - 1.0 mg/dL   Nitrite NEGATIVE NEGATIVE   Leukocytes,Ua NEGATIVE NEGATIVE    Comment: Biochemical Testing  Only. Please order routine urinalysis from main lab if confirmatory testing is needed.  POCT urinalysis dip (device)     Status: Abnormal   Collection Time: 04/23/19 12:34 PM  Result Value Ref Range   Glucose, UA NEGATIVE NEGATIVE mg/dL     Bilirubin Urine NEGATIVE NEGATIVE   Ketones, ur NEGATIVE NEGATIVE mg/dL   Specific Gravity, Urine 1.020 1.005 - 1.030   Hgb urine dipstick TRACE (A) NEGATIVE   pH 7.0 5.0 - 8.0   Protein, ur NEGATIVE NEGATIVE mg/dL   Urobilinogen, UA 0.2 0.0 - 1.0 mg/dL   Nitrite NEGATIVE NEGATIVE   Leukocytes,Ua NEGATIVE NEGATIVE    Comment: Biochemical Testing Only. Please order routine urinalysis from main lab if confirmatory testing is needed.   CT Abdomen Pelvis W Contrast  Result Date: 04/21/2019 CLINICAL DATA:  Right lower quadrant abdominal pain and nausea EXAM: CT ABDOMEN AND PELVIS WITH CONTRAST TECHNIQUE: Multidetector CT imaging of the abdomen and pelvis was performed using the standard protocol following bolus administration of intravenous contrast. CONTRAST:  80mL OMNIPAQUE IOHEXOL 300 MG/ML  SOLN COMPARISON:  CT abdomen pelvis 12/26/2017 FINDINGS: Lower chest: Lung bases are clear. Normal heart size. No pericardial effusion. Hepatobiliary: No focal liver abnormality is seen. No gallstones, gallbladder wall thickening, or biliary dilatation. Pancreas: Unremarkable. No pancreatic ductal dilatation or surrounding inflammatory changes. Spleen: Normal in size without focal abnormality. Adrenals/Urinary Tract: Adrenal glands are unremarkable. Kidneys are normal, without renal calculi, focal lesion, or hydronephrosis. Bladder is unremarkable. Stomach/Bowel: Distal esophagus, stomach and duodenal sweep are unremarkable. No small bowel wall thickening or dilatation. No evidence of obstruction. Normal appendix in the right lower quadrant, coronal images 42-55. No colonic dilatation or wall thickening. Scattered colonic diverticula without focal pericolonic inflammation to suggest diverticulitis. Vascular/Lymphatic: The aorta is normal caliber. No suspicious or enlarged lymph nodes in the included lymphatic chains. Reproductive: Normal anteverted uterus. 2.2 cm dominant follicle in the right ovary. No concerning  adnexal lesions. Other: Trace low-attenuation fluid in the deep pelvis, may be physiologic in a reproductive age female. Musculoskeletal: No acute osseous abnormality or suspicious osseous lesion. Bilateral L5 pars defects are similar to prior with grade 1 anterolisthesis L5 on S1. IMPRESSION: 1. Trace low-attenuation fluid in the deep pelvis, may be physiologic in a reproductive age female. 2. No other acute abnormality in the abdomen or pelvis to provide cause for patient's right lower quadrant pain. Normal appendix. 3. 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 Electronically Signed   By: Kreg ShropshirePrice  DeHay M.D.   On: 04/21/2019 15:28      Assessment and Plan :   I have reviewed the PDMP during this encounter.  1. Right sided abdominal pain   2. RLQ abdominal pain     Undifferentiated abdominal pain.  Reviewed all lab results and imaging with patient.  STI testing, urine culture pending.  Counseled patient to monitor her stools for blood.  Recommended trying to pursue an appointment/consult with a gastroenterologist given her history of diverticula on CT scan in setting of her abdominal pain.  Also counseled that she may benefit from having a pelvic and transvaginal ultrasound.  In the meantime, I will have her schedule naproxen for pain and inflammation, tramadol for breakthrough pain.  Vital signs stable fore discharge. Strict ER precautions. Counseled patient on potential for adverse effects with medications prescribed today, patient verbalized understanding.    Wallis BambergMani, Merin Borjon, PA-C 04/23/19 1438

## 2019-04-23 NOTE — Discharge Instructions (Addendum)
Jordan Hanson I am sorry we have not found a definite source for your abdominal pain.  However you have had very reassuring lab studies and a CT scan.  There was the abnormal finding of scattered diverticula on the CT scan but the good thing is that there was no diverticulitis.  This can change however and if you start to notice blood in your stools or continue to have worsening abdominal pain, fever then you have to report back to the emergency room so that they could consider getting a repeat CT scan.  In the meantime I want you to start naproxen for pain and inflammation and use tramadol for severe breakthrough pain.  I will let you know about your urine culture results and the cervical swab results and we will treat you as appropriate based off of those.

## 2019-04-23 NOTE — ED Triage Notes (Signed)
Pt states she was in the ER on Tuesday night pt states she has a constant pain in her RLQ that's radiating into her back. Pt states she has get nauseous.

## 2019-04-24 LAB — CERVICOVAGINAL ANCILLARY ONLY
Bacterial Vaginitis (gardnerella): POSITIVE — AB
Candida Glabrata: NEGATIVE
Candida Vaginitis: NEGATIVE
Chlamydia: NEGATIVE
Comment: NEGATIVE
Comment: NEGATIVE
Comment: NEGATIVE
Comment: NEGATIVE
Comment: NEGATIVE
Comment: NORMAL
Neisseria Gonorrhea: NEGATIVE
Trichomonas: NEGATIVE

## 2019-04-24 LAB — URINE CULTURE: Culture: NO GROWTH

## 2019-04-27 ENCOUNTER — Other Ambulatory Visit: Payer: Self-pay | Admitting: Urgent Care

## 2019-04-27 MED ORDER — METRONIDAZOLE 500 MG PO TABS: 500.0000 mg | ORAL_TABLET | Freq: Two times a day (BID) | ORAL | 0 refills | Status: DC

## 2019-05-05 ENCOUNTER — Other Ambulatory Visit: Payer: Self-pay

## 2019-05-05 ENCOUNTER — Encounter (HOSPITAL_COMMUNITY): Payer: Self-pay

## 2019-05-05 ENCOUNTER — Emergency Department (HOSPITAL_COMMUNITY)
Admission: EM | Admit: 2019-05-05 | Discharge: 2019-05-06 | Disposition: A | Discharge status: Home / Self Care | Payer: Medicaid Other | Attending: Emergency Medicine | Admitting: Emergency Medicine

## 2019-05-05 ENCOUNTER — Emergency Department (HOSPITAL_COMMUNITY): Payer: Medicaid Other

## 2019-05-05 DIAGNOSIS — S0990XA Unspecified injury of head, initial encounter: Secondary | ICD-10-CM | POA: Diagnosis not present

## 2019-05-05 DIAGNOSIS — Y999 Unspecified external cause status: Secondary | ICD-10-CM | POA: Diagnosis not present

## 2019-05-05 DIAGNOSIS — W2209XA Striking against other stationary object, initial encounter: Secondary | ICD-10-CM | POA: Insufficient documentation

## 2019-05-05 DIAGNOSIS — Z79899 Other long term (current) drug therapy: Secondary | ICD-10-CM | POA: Insufficient documentation

## 2019-05-05 DIAGNOSIS — Y92013 Bedroom of single-family (private) house as the place of occurrence of the external cause: Secondary | ICD-10-CM | POA: Diagnosis not present

## 2019-05-05 DIAGNOSIS — Y93E5 Activity, floor mopping and cleaning: Secondary | ICD-10-CM | POA: Insufficient documentation

## 2019-05-05 DIAGNOSIS — F17210 Nicotine dependence, cigarettes, uncomplicated: Secondary | ICD-10-CM | POA: Insufficient documentation

## 2019-05-05 NOTE — ED Triage Notes (Signed)
Onset 10am pt hit front part of head on metal rail, `2" indention.    C/o headache, light sensitivity, and intermittant nausea.

## 2019-05-06 NOTE — ED Provider Notes (Signed)
[facility] EMERGENCY DEPARTMENT Provider Note   CSN: [id number] Arrival date & time: 05/05/19  2159     History Chief Complaint  Patient presents with  . Head Injury    Jordan Hanson is a 24 y.o. female with history of ADHD, bipolar disorder, borderline personality disorder, and depression who presents to the emergency department with a chief complaint of head injury.  The patient reports that she was at work earlier today.  She was cleaning in the floor and then lifted her head up and hit the top of her head on a metal bed frame.  No syncope, nausea, or vomiting.  She reports that several hours after the incident that she developed a headache located at the top of her head, photophobia, and intermittent nausea.  The symptoms have continued to improve since onset.  She took ibuprofen at home for her symptoms with some improvement in her headache.  She is concerned that she has a 2 inch indentation on the top of her head from hitting the bed frame that she later felt when rubbing the top of her head.  No fever, chills, neck pain or stiffness, weakness, numbness, slurred speech, diplopia, change in gait, vomiting since the incident.    The history is provided by the patient. No language interpreter was used.       Past Medical History:  Diagnosis Date  . ADHD (attention deficit hyperactivity disorder)   . Bipolar disorder (HCC)   . Depression     Patient Active Problem List   Diagnosis Date Noted  . Borderline personality disorder (HCC) 02/13/2016  . Bipolar 1 disorder, depressed (HCC) 02/10/2016  . Suicidal ideation 07/19/2014  . 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 MDD (major depressive disorder) 06/17/2014    Past Surgical History:  Procedure Laterality Date  . TONSILLECTOMY       OB History   No obstetric history on file.     Family History  Adopted: Yes    Social History   Tobacco  Use  . Smoking status: Current Every Day Smoker    Packs/day: 0.50    Types: Cigarettes  . Smokeless tobacco: Never Used  Substance Use Topics  . Alcohol use: No    Comment: occ  . Drug use: No    Comment: smokes weed every day    Home Medications Prior to Admission medications   Medication Sig Start Date End Date Taking? Authorizing Provider  Acetaminophen (MIDOL PO) Take 2 tablets by mouth daily as needed (pain).   Yes [provider]  hydrOXYzine (ATARAX/VISTARIL) 25 MG tablet Take 25 mg by mouth at bedtime as needed for anxiety. 03/27/19  Yes [provider]  ibuprofen (ADVIL) 200 MG tablet Take 800 mg by mouth every 6 (six) hours as needed for moderate pain.   Yes [provider]  naproxen (NAPROSYN) 500 MG tablet Take 1 tablet (500 mg total) by mouth 2 (two) times daily with a meal. 04/23/19  Yes Wallis Bamberg, PA-C  traMADol (ULTRAM) 50 MG tablet Take 1 tablet (50 mg total) by mouth every 6 (six) hours as needed for severe pain. 04/23/19  Yes Wallis Bamberg, PA-C  VRAYLAR capsule Take 1.5 mg by mouth daily. 03/28/19  Yes [provider]    Allergies    Geodon [ziprasidone hcl], Penicillins, and Tamsulosin  Review of Systems   Review of Systems  Constitutional: Negative for activity change, chills and fever.  Eyes: Positive  for photophobia.  Respiratory: Negative for shortness of breath.   Cardiovascular: Negative for chest pain.  Gastrointestinal: Positive for nausea. Negative for abdominal pain, anal bleeding, blood in stool, constipation, diarrhea and vomiting.  Genitourinary: Negative for dysuria.  Musculoskeletal: Negative for back pain.  Skin: Negative for rash.  Allergic/Immunologic: Negative for immunocompromised state.  Neurological: Positive for headaches. Negative for dizziness, seizures, syncope, weakness and numbness.  Psychiatric/Behavioral: Negative for confusion.    Physical Exam Updated Vital Signs BP 100/61   Pulse 79    Temp 98.6 F (37 C) (Oral)   Resp 17   LMP 04/09/2019   SpO2 98%   Physical Exam Vitals and nursing note reviewed.  Constitutional:      General: She is not in acute distress. HENT:     Head: Normocephalic. No raccoon eyes, Battle's sign, abrasion, contusion or masses.     Comments: Head appears atraumatic throughout.  No evidence of hematoma.  There is no crepitus or step-off noted to the skull.  No overlying superficial abrasions. Eyes:     Extraocular Movements: Extraocular movements intact.     Conjunctiva/sclera: Conjunctivae normal.     Pupils: Pupils are equal, round, and reactive to light.  Cardiovascular:     Rate and Rhythm: Normal rate and regular rhythm.     Heart sounds: No murmur. No friction rub. No gallop.   Pulmonary:     Effort: Pulmonary effort is normal. No respiratory distress.     Breath sounds: No stridor. No wheezing, rhonchi or rales.  Chest:     Chest wall: No tenderness.  Abdominal:     General: There is no distension.     Palpations: Abdomen is soft. There is no mass.     Tenderness: There is no abdominal tenderness. There is no right CVA tenderness, left CVA tenderness, guarding or rebound.     Hernia: No hernia is present.  Musculoskeletal:        General: No tenderness.     Cervical back: Neck supple.     Right lower leg: No edema.     Left lower leg: No edema.  Skin:    General: Skin is warm.     Findings: No rash.  Neurological:     Mental Status: She is alert.     Comments: Cranial nerves II through XII are grossly intact.  5 of 5 strength against resistance of the bilateral upper and lower extremities.  Sensation is intact and equal throughout.  Gait is not ataxic.  No pronator drift.  Finger-to-nose is intact bilaterally.  Psychiatric:        Behavior: Behavior normal.     ED Results / Procedures / Treatments   Labs (all labs ordered are listed, but only abnormal results are displayed) Labs Reviewed - No data to  display  EKG None  Radiology CT Head Wo Contrast  Result Date: 05/05/2019 CLINICAL DATA:  Recent fall with headaches, initial encounter EXAM: CT HEAD WITHOUT CONTRAST TECHNIQUE: Contiguous axial images were obtained from the base of the skull through the vertex without intravenous contrast. COMPARISON:  01/29/2017 FINDINGS: Brain: No evidence of acute infarction, hemorrhage, hydrocephalus, extra-axial collection or mass lesion/mass effect. Vascular: No hyperdense vessel or unexpected calcification. Skull: Normal. Negative for fracture or focal lesion. Sinuses/Orbits: No acute finding. 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 IMPRESSION: No acute abnormality is noted. Electronically Signed   By: Linus Mako.D.  On: 05/05/2019 23:08    Procedures Procedures (including critical care time)  Medications Ordered in ED Medications - No data to display  ED Course  I have reviewed the triage vital signs and the nursing notes.  Pertinent labs & imaging results that were available during my care of the patient were reviewed by me and considered in my medical decision making (see chart for details).    MDM Rules/Calculators/A&P                      24 year old female with history of ADHD, bipolar disorder, borderline personality disorder, and depression presenting with a head injury that occurred earlier today.  She hit the top of her head on a metal bed frame while cleaning.  No syncope, vomiting.  She had a headache, photophobia, nausea that developed several hours after the incident.  Her primary concern is that she has "a 2 inch indentation" to the top of her skull.  CT head was obtained by triage is unremarkable.  On exam, there is no evidence of acute head trauma.  Neurologic exam is unremarkable.   Patient was offered a migraine cocktail in the ER, but declined and preferred to take ibuprofen at home.  I think this is  reasonable.  It is possible that she may have a concussion secondary to the head trauma given her symptoms that began after hitting her head so I will refer her to the concussion clinic.  Doubt CVA, subdural hematoma, occult skull fracture.  She is hemodynamically stable and in no acute distress.  Safe for discharge home with outpatient follow-up as needed.  Final Clinical Impression(s) / ED Diagnoses Final diagnoses:  Injury of head, initial encounter    Rx / DC Orders ED Discharge Orders    None       Barkley Boards, PA-C 05/06/19 [id number]    Gilda Crease, MD 05/06/19 [phone]

## 2019-05-06 NOTE — ED Notes (Signed)
Patient verbalizes understanding of discharge instructions. Opportunity for questioning and answers were provided. Pt discharged from ED stable & ambulatory.   

## 2019-05-06 NOTE — Discharge Instructions (Signed)
Thank you for allowing me to care for you today in the Emergency Department.   Take 650 mg of Tylenol or 600 mg of ibuprofen with food every 6 hours for pain.  You can alternate between these 2 medications every 3 hours if your pain returns.  For instance, you can take Tylenol at noon, followed by a dose of ibuprofen at 3, followed by second dose of Tylenol and 6.  Sometimes after hitting your head you can have symptoms where you feel as if you might have brain fog, intermittent dizziness, nausea or vomiting.  These are all symptoms of a concussion.  I provided you with a referral to the concussion clinic listed above.  A concussion is not seen on a head CT.  Your head CT in the ER was unremarkable.  Return to the emergency department if you develop uncontrollable vomiting, new numbness or weakness, slurred speech, or other new, concerning symptoms.

## 2019-05-18 ENCOUNTER — Other Ambulatory Visit: Payer: Self-pay

## 2019-05-18 ENCOUNTER — Encounter: Payer: Self-pay | Admitting: Family Medicine

## 2019-05-18 ENCOUNTER — Ambulatory Visit (INDEPENDENT_AMBULATORY_CARE_PROVIDER_SITE_OTHER): Payer: Medicaid Other | Admitting: Family Medicine

## 2019-05-18 VITALS — BP 111/72 | HR 79 | Temp 98.0°F | Ht 65.0 in | Wt 143.0 lb

## 2019-05-18 DIAGNOSIS — N926 Irregular menstruation, unspecified: Secondary | ICD-10-CM | POA: Diagnosis not present

## 2019-05-18 DIAGNOSIS — S060X0D Concussion without loss of consciousness, subsequent encounter: Secondary | ICD-10-CM

## 2019-05-18 DIAGNOSIS — G43909 Migraine, unspecified, not intractable, without status migrainosus: Secondary | ICD-10-CM | POA: Insufficient documentation

## 2019-05-18 NOTE — Progress Notes (Signed)
5/10/20212:47 PM  Jordan Hanson [birth year], 24 y.o., female [id number]  Chief Complaint  Patient presents with  . Menstrual Problem    irregular periods   . New Patient (Initial Visit)  . Concussion    2 weeks ago     HPI:   Patient is a 24 y.o. female with past medical history of ADHD, bipolar disorder, borderline personality disorder and depression who presents today to establish care  Has not had PCP in many years Has not had an OB gyn in many years Does have a psychiatrist at [facility] Does have a therapist at [facility]  Seen ER April 27th 2021 for head injury after hitting top of head at work, no LOC, ct scan head - no acute findings, possible concussion - continues to have daily headaches, photophobia, dizziness, has been trying to just cope with headaches, was given tramadol in ER, does not really like to take meds, has h/o previous concussions  Seen in ER 4/13 and 15th for abd pain, ct scan - diverticula, neg STD, + BV - has just started mzd 3 days ago, starting to feel better  Menarche at age 44 Had irregular periods for 2 years as a teenager but then normalized until now Had normal menses April 27th and then today started bleeding gain, mild bleeding Uses condoms for [facility], steady partner 3 years, inconsistent use G0 Last pap about 4-5 years ago, scheduled for CPE     Depression screen [facility] 2/9 05/18/2019  Decreased Interest 0  Down, Depressed, Hopeless 0  PHQ - 2 Score 0    Fall Risk  05/18/2019  Falls in the past year? 1  Number falls in past yr: 0  Injury with Fall? 1  Follow up Falls evaluation completed     Allergies  Allergen Reactions  . Geodon [Ziprasidone Hcl]   . Penicillins Swelling and Rash    Has patient had a PCN reaction causing immediate rash, facial/tongue/throat swelling, SOB or lightheadedness with hypotension:YES Has patient had a PCN reaction causing severe rash involving mucus membranes or skin necrosis: NO Has  patient had a PCN reaction that required hospitalization NO Has patient had a PCN reaction occurring within the last 10 years: NO If all of the above answers are "NO", then may proceed with Cephalosporin use.   . Tamsulosin Rash    Prior to Admission medications   Medication Sig Start Date End Date Taking? Authorizing Provider  Acetaminophen (MIDOL PO) Take 2 tablets by mouth daily as needed (pain).   Yes [provider]  hydrOXYzine (ATARAX/VISTARIL) 25 MG tablet Take 25 mg by mouth at bedtime as needed for anxiety. 03/27/19  Yes [provider]  naproxen (NAPROSYN) 500 MG tablet Take 1 tablet (500 mg total) by mouth 2 (two) times daily with a meal. 04/23/19  Yes Jaynee Eagles, PA-C  VRAYLAR capsule Take 1.5 mg by mouth daily. 03/28/19  Yes [provider]  ibuprofen (ADVIL) 200 MG tablet Take 800 mg by mouth every 6 (six) hours as needed for moderate pain.    [provider]  traMADol (ULTRAM) 50 MG tablet Take 1 tablet (50 mg total) by mouth every 6 (six) hours as needed for severe pain. Patient not taking: Reported on 05/18/2019 04/23/19   Jaynee Eagles, PA-C    Past Medical History:  Diagnosis Date  . ADHD (attention deficit hyperactivity disorder)   . Bipolar disorder ([place])   . Depression     Past Surgical History:  Procedure  Laterality Date  . TONSILLECTOMY      Social History   Tobacco Use  . Smoking status: Current Every Day Smoker    Packs/day: 0.50    Types: Cigarettes  . Smokeless tobacco: Never Used  Substance Use Topics  . Alcohol use: No    Comment: occ    Family History  Adopted: Yes    ROS Per hpi  OBJECTIVE:  Today's Vitals   05/18/19 1435  BP: 111/72  Pulse: 79  Temp: 98 F (36.7 C)  SpO2: 98%  Weight: 143 lb (64.9 kg)  Height: 5\' 5"  (1.651 m)   Body mass index is 23.8 kg/m.   Physical Exam Vitals and nursing note reviewed.  Constitutional:      Appearance: She is well-developed.  HENT:     Head:  Normocephalic and atraumatic.  Eyes:     General: No scleral icterus.    Conjunctiva/sclera: Conjunctivae normal.     Pupils: Pupils are equal, round, and reactive to light.  Pulmonary:     Effort: Pulmonary effort is normal.  Musculoskeletal:     Cervical back: Neck supple.  Skin:    General: Skin is warm and dry.  Neurological:     Mental Status: She is alert and oriented to person, place, and time.     No results found for this or any previous visit (from the past 24 hour(s)).  No results found.   ASSESSMENT and PLAN  1. Irregular periods First occurrence. Discussed monitoring clinically. Discussed BC - not interested at this time.  - TSH - hCG, serum, qualitative  2. Concussion without loss of consciousness, subsequent encounter Treatment of headaches limited by bipolar and not on BC. Discussed importance of brain rest and sleep. Discussed RTC precautions  Return for as scheduled.    , MD [facility] at [facility] [address] [place], [place] Ph.  [phone] Fax [phone]

## 2019-05-18 NOTE — Patient Instructions (Addendum)
Metrorrhagia Metrorrhagia is bleeding from the uterus that happens irregularly but often. The bleeding generally happens between menstrual periods. Follow these instructions at home: Pay attention to any changes in your symptoms. Let your health care provider know about them. Follow these instructions to help with your condition: Eating and drinking   Eat well-balanced meals. Include foods that are high in iron, such as liver, meat, shellfish, green leafy vegetables, and eggs.  If you become constipated: ? Drink plenty of water. Drink enough to keep your urine pale yellow. ? Take over-the-counter or prescription medicines. ? Eat foods that are high in fiber, such as beans, whole grains, and fresh fruits and vegetables. ? Limit foods that are high in fat and processed sugars, such as fried or sweet foods. Medicines  Take over-the-counter and prescription medicines only as told by your health care provider.  Do not change medicines without talking with your health care provider.  Aspirin or medicines that contain aspirin may make the bleeding worse. Do not take these medicines: ? During your period. ? During the week before your period.  If you were prescribed iron pills, take them as told by your health care provider. Iron pills help to replace iron that your body loses because of this condition. Activity  If you need to change your sanitary pad or tampon more than one time every 2 hours: ? Lie in bed with your feet raised (elevated). ? Place a cold pack on your lower abdomen. ? Rest as much as possible until the bleeding stops or slows down. General instructions   For 2 months, write down: ? When your period starts. ? When your period ends. ? When any abnormal bleeding occurs. ? What problems you notice.  Keep all follow-up visits as told by your health care provider. This is important. Contact a health care provider if:  You get light-headed or weak.  You have nausea  and vomiting.  You cannot eat or drink without vomiting.  You feel dizzy or have diarrhea while you are taking medicine.  Have questions about birth control. Get help right away if:  You develop a fever or chills.  You need to change your sanitary pad or tampon more than one time per hour.  Your bleeding becomesheavy.  Your flow contains clots.  You develop pain in your abdomen.  You lose consciousness.  You develop a rash. Summary  Metrorrhagia is bleeding from the uterus that happens irregularly but often, usually between menstrual periods.  Pay attention to any changes in your symptoms. Let your health care provider know about them.  Eat well-balanced meals. Include foods that are high in iron, such as liver, meat, shellfish, green leafy vegetables, and eggs.  Get help right away if you develop a fever, you see clots in your blood, your bleeding becomes heavy, you develop a rash, or you lose consciousness. This information is not intended to replace advice given to you by your health care provider. Make sure you discuss any questions you have with your health care provider. Document Revised: 06/27/2017 Document Reviewed: 06/27/2017 Elsevier Patient Education  [employer].    If you have lab work done today you will be contacted with your lab results within the next 2 weeks.  If you have not heard from [place] then please contact us. The fastest way to get your results is to register for My Chart.   IF you received an x-ray today, you will receive an invoice from [facility].  Please contact [facility] at [phone] with questions or concerns regarding your invoice.   IF you received labwork today, you will receive an invoice from [facility]. Please contact [facility] at [phone] with questions or concerns regarding your invoice.   Our billing staff will not be able to assist you with questions regarding bills from these companies.  You will be  contacted with the lab results as soon as they are available. The fastest way to get your results is to activate your My Chart account. Instructions are located on the last page of this paperwork. If you have not heard from [place] regarding the results in 2 weeks, please contact this office.

## 2019-05-19 ENCOUNTER — Telehealth: Payer: Self-pay | Admitting: General Practice

## 2019-05-19 DIAGNOSIS — Z0000 Encounter for general adult medical examination without abnormal findings: Secondary | ICD-10-CM

## 2019-05-19 LAB — HCG, SERUM, QUALITATIVE: hCG,Beta Subunit,Qual,Serum: NEGATIVE m[IU]/mL (ref <6)

## 2019-05-19 LAB — TSH: TSH: 1.55 u[IU]/mL (ref 0.450–4.500)

## 2019-05-19 NOTE — Telephone Encounter (Signed)
Please order labs for physical coming up 06/04/2019

## 2019-05-19 NOTE — Addendum Note (Signed)
Addended by: Eldred Manges on: 05/19/2019 02:26 PM   Modules accepted: Orders

## 2019-06-01 ENCOUNTER — Ambulatory Visit: Payer: Medicaid Other

## 2019-06-01 ENCOUNTER — Other Ambulatory Visit: Payer: Self-pay

## 2019-06-01 DIAGNOSIS — Z0000 Encounter for general adult medical examination without abnormal findings: Secondary | ICD-10-CM

## 2019-06-01 LAB — CMP14+EGFR
ALT: 16 IU/L (ref 0–32)
AST: 22 IU/L (ref 0–40)
Albumin/Globulin Ratio: 2.3 — ABNORMAL HIGH (ref 1.2–2.2)
Albumin: 4.6 g/dL (ref 3.9–5.0)
Alkaline Phosphatase: 53 IU/L (ref 48–121)
BUN/Creatinine Ratio: 17 (ref 9–23)
BUN: 12 mg/dL (ref 6–20)
Bilirubin Total: 0.2 mg/dL (ref 0.0–1.2)
CO2: 22 mmol/L (ref 20–29)
Calcium: 10 mg/dL (ref 8.7–10.2)
Chloride: 105 mmol/L (ref 96–106)
Creatinine, Ser: 0.7 mg/dL (ref 0.57–1.00)
GFR calc Af Amer: 138 mL/min/{1.73_m2} (ref >59)
GFR calc non Af Amer: 120 mL/min/{1.73_m2} (ref >59)
Globulin, Total: 2 g/dL (ref 1.5–4.5)
Glucose: 78 mg/dL (ref 65–99)
Potassium: 4.8 mmol/L (ref 3.5–5.2)
Sodium: 140 mmol/L (ref 134–144)
Total Protein: 6.6 g/dL (ref 6.0–8.5)

## 2019-06-01 LAB — CBC WITH DIFFERENTIAL/PLATELET
Basophils Absolute: 0.1 10*3/uL (ref 0.0–0.2)
Basos: 2 %
EOS (ABSOLUTE): 0.1 10*3/uL (ref 0.0–0.4)
Eos: 2 %
Hematocrit: 41.2 % (ref 34.0–46.6)
Hemoglobin: 13.4 g/dL (ref 11.1–15.9)
Immature Grans (Abs): 0 10*3/uL (ref 0.0–0.1)
Immature Granulocytes: 0 %
Lymphocytes Absolute: 2.6 10*3/uL (ref 0.7–3.1)
Lymphs: 34 %
MCH: 29.3 pg (ref 26.6–33.0)
MCHC: 32.5 g/dL (ref 31.5–35.7)
MCV: 90 fL (ref 79–97)
Monocytes Absolute: 0.6 10*3/uL (ref 0.1–0.9)
Monocytes: 8 %
Neutrophils Absolute: 4.1 10*3/uL (ref 1.4–7.0)
Neutrophils: 54 %
Platelets: 307 10*3/uL (ref 150–450)
RBC: 4.58 x10E6/uL (ref 3.77–5.28)
RDW: 11.8 % (ref 11.7–15.4)
WBC: 7.5 10*3/uL (ref 3.4–10.8)

## 2019-06-01 LAB — LIPID PANEL
Chol/HDL Ratio: 3.6 ratio (ref 0.0–4.4)
Cholesterol, Total: 156 mg/dL (ref 100–199)
HDL: 43 mg/dL (ref >39)
LDL Chol Calc (NIH): 101 mg/dL — ABNORMAL HIGH (ref 0–99)
Triglycerides: 62 mg/dL (ref 0–149)
VLDL Cholesterol Cal: 12 mg/dL (ref 5–40)

## 2019-06-01 LAB — HEMOGLOBIN A1C
Est. average glucose Bld gHb Est-mCnc: 97 mg/dL
Hgb A1c MFr Bld: 5 % (ref 4.8–5.6)

## 2019-06-03 ENCOUNTER — Encounter (HOSPITAL_COMMUNITY): Payer: Self-pay | Admitting: Emergency Medicine

## 2019-06-03 ENCOUNTER — Ambulatory Visit (HOSPITAL_COMMUNITY)
Admission: EM | Admit: 2019-06-03 | Discharge: 2019-06-03 | Disposition: A | Discharge status: Home / Self Care | Payer: Medicaid Other

## 2019-06-03 ENCOUNTER — Other Ambulatory Visit: Payer: Self-pay

## 2019-06-03 DIAGNOSIS — T148XXA Other injury of unspecified body region, initial encounter: Secondary | ICD-10-CM | POA: Diagnosis not present

## 2019-06-03 DIAGNOSIS — R109 Unspecified abdominal pain: Secondary | ICD-10-CM

## 2019-06-03 LAB — POCT URINALYSIS DIP (DEVICE)
Bilirubin Urine: NEGATIVE
Glucose, UA: NEGATIVE mg/dL
Ketones, ur: NEGATIVE mg/dL
Leukocytes,Ua: NEGATIVE
Nitrite: NEGATIVE
Protein, ur: NEGATIVE mg/dL
Specific Gravity, Urine: >=1.03 (ref 1.005–1.030)
Urobilinogen, UA: 0.2 mg/dL (ref 0.0–1.0)
pH: 5.5 (ref 5.0–8.0)

## 2019-06-03 MED ORDER — NAPROXEN 500 MG PO TABS
500.0000 mg | ORAL_TABLET | Freq: Two times a day (BID) | ORAL | 0 refills | Status: DC
Start: 2019-06-03 — End: 2019-09-08

## 2019-06-03 NOTE — ED Triage Notes (Signed)
Back pain started 06/02/2019 no known injury.  Patient relates to repetitive movement at work.  Pain is predominantly lower left to center back.  Denies persistent pain in either leg, one episode of sharp pain in top of left thigh.  Denies bowel or bladder incontinence.

## 2019-06-03 NOTE — ED Provider Notes (Signed)
[facility]    CSN: [id number] Arrival date & time: 06/03/19  1036      History   Chief Complaint Chief Complaint  Patient presents with  . Back Pain    HPI Jordan Hanson is a 24 y.o. female.   Patient is a 24 year old female with a history of ADHD, Bipolar 1 disorder, suicidal ideation, major depressive disorder, borderline personality disorder. Presents with new onset back pain beginning yesterday around noon. Describes pain as sharp, shooting in lower back, worse on left side. Denies recent injury. Pain aggravated by ambulating and sitting for extended periods of time. Denies numbness, tingling, decreased sensation, weakness in lower extremities. Reports symptoms of burning with urination, small amount of blood at urethra, and urgency beginning 4 days ago.  No saddle paresthesias or loss of bowel bladder function.  No fevers.  ROS per HPI      Past Medical History:  Diagnosis Date  . ADHD (attention deficit hyperactivity disorder)   . Anxiety   . Bipolar disorder ([place])   . Depression     Patient Active Problem List   Diagnosis Date Noted  . Migraines 05/18/2019  . Irregular periods 05/18/2019  . Borderline personality disorder ([place]) 02/13/2016  . Bipolar 1 disorder, depressed ([place]) 02/10/2016  . Suicidal ideation 07/19/2014  . MDD (major depressive disorder), recurrent severe, without psychosis ([place]) 06/19/2014  . MDD (major depressive disorder) 06/17/2014    Past Surgical History:  Procedure Laterality Date  . SMALL INTESTINE SURGERY    . TONSILLECTOMY      OB History   No obstetric history on file.      Home Medications    Prior to Admission medications   Medication Sig Start Date End Date Taking? Authorizing Provider  hydrOXYzine (ATARAX/VISTARIL) 25 MG tablet Take 25 mg by mouth at bedtime as needed for anxiety. 03/27/19  Yes [provider]  naproxen sodium (ALEVE) 220 MG tablet Take 220 mg by mouth.   Yes [provider]  VRAYLAR capsule Take 1.5 mg by mouth daily. 03/28/19  Yes [provider]  Acetaminophen (MIDOL PO) Take 2 tablets by mouth daily as needed (pain).    [provider]  ibuprofen (ADVIL) 200 MG tablet Take 800 mg by mouth every 6 (six) hours as needed for moderate pain.    [provider]  naproxen (NAPROSYN) 500 MG tablet Take 1 tablet (500 mg total) by mouth 2 (two) times daily with a meal. 06/03/19   Orvan July, NP    Family History Family History  Adopted: Yes    Social History Social History   Tobacco Use  . Smoking status: Current Every Day Smoker    Packs/day: 0.50    Years: 6.00    Pack years: 3.00    Types: Cigarettes  . Smokeless tobacco: Never Used  Substance Use Topics  . Alcohol use: Yes    Comment: occ  . Drug use: Yes    Types: Marijuana    Comment: smokes weed every day     Allergies   Geodon [ziprasidone hcl], Penicillins, and Tamsulosin   Review of Systems Review of Systems   Physical Exam Triage Vital Signs ED Triage Vitals  Enc Vitals Group     BP 06/03/19 1112 112/72     Pulse Rate 06/03/19 1112 69     Resp 06/03/19 1112 18     Temp 06/03/19 1112 98.9 F (37.2 C)     Temp Source 06/03/19 1112 Oral  SpO2 06/03/19 1112 98 %     Weight --      Height --      Head Circumference --      Peak Flow --      Pain Score 06/03/19 1109 8     Pain Loc --      Pain Edu? --      Excl. in GC? --    No data found.  Updated Vital Signs BP 112/72 (BP Location: Right Arm)   Pulse 69   Temp 98.9 F (37.2 C) (Oral)   Resp 18   LMP 05/30/2019   SpO2 98%   Visual Acuity Right Eye Distance:   Left Eye Distance:   Bilateral Distance:    Right Eye Near:   Left Eye Near:    Bilateral Near:     Physical Exam Vitals and nursing note reviewed.  Constitutional:      General: She is not in acute distress.    Appearance: Normal appearance. She is not ill-appearing, toxic-appearing or diaphoretic.  HENT:      Head: Normocephalic.     Nose: Nose normal.  Eyes:     Conjunctiva/sclera: Conjunctivae normal.  Pulmonary:     Effort: Pulmonary effort is normal.  Musculoskeletal:        General: Normal range of motion.     Cervical back: Normal range of motion.     Comments: Mildly tender to left lower lumbar paravertebral musculature.  Skin:    General: Skin is warm and dry.     Findings: No rash.  Neurological:     Mental Status: She is alert.  Psychiatric:        Mood and Affect: Mood normal.      UC Treatments / Results  Labs (all labs ordered are listed, but only abnormal results are displayed) Labs Reviewed  POCT URINALYSIS DIP (DEVICE) - Abnormal; Notable for the following components:      Result Value   Hgb urine dipstick SMALL (*)    All other components within normal limits    EKG   Radiology No results found.  Procedures Procedures (including critical care time)  Medications Ordered in UC Medications - No data to display  Initial Impression / Assessment and Plan / UC Course  I have reviewed the triage vital signs and the nursing notes.  Pertinent labs & imaging results that were available during my care of the patient were reviewed by me and considered in my medical decision making (see chart for details).     Muscle strain Urine negative for infection.  Small hemoglobin most likely from menstrual cycle. Treating with naproxen for pain and inflammation.  Recommended apply heat to the area. Follow up as needed for continued or worsening symptoms  Final Clinical Impressions(s) / UC Diagnoses   Final diagnoses:  Muscle strain     Discharge Instructions     Your urine did not show any infection. Most likely this is muscle strain in your lower back.  You can take naproxen twice a day for pain, inflammation. Apply heat to the area Follow up as needed for continued or worsening symptoms     ED Prescriptions    Medication Sig Dispense Auth. Provider    naproxen (NAPROSYN) 500 MG tablet Take 1 tablet (500 mg total) by mouth 2 (two) times daily with a meal. 30 tablet Carisa Backhaus A, NP     PDMP not reviewed this encounter.   Janace Aris, NP 06/03/19 1311

## 2019-06-03 NOTE — Discharge Instructions (Addendum)
Your urine did not show any infection. Most likely this is muscle strain in your lower back.  You can take naproxen twice a day for pain, inflammation. Apply heat to the area Follow up as needed for continued or worsening symptoms

## 2019-06-04 ENCOUNTER — Encounter: Payer: Self-pay | Admitting: Family Medicine

## 2019-06-04 ENCOUNTER — Ambulatory Visit: Payer: Medicaid Other | Admitting: Emergency Medicine

## 2019-06-04 ENCOUNTER — Ambulatory Visit (INDEPENDENT_AMBULATORY_CARE_PROVIDER_SITE_OTHER): Payer: Medicaid Other | Admitting: Family Medicine

## 2019-06-04 ENCOUNTER — Other Ambulatory Visit: Payer: Self-pay

## 2019-06-04 ENCOUNTER — Other Ambulatory Visit (HOSPITAL_COMMUNITY)
Admission: RE | Admit: 2019-06-04 | Discharge: 2019-06-04 | Disposition: A | Discharge status: Home / Self Care | Payer: Medicaid Other | Source: Ambulatory Visit | Attending: Family Medicine | Admitting: Family Medicine

## 2019-06-04 VITALS — BP 109/74 | HR 67 | Temp 98.0°F | Ht 65.0 in | Wt 144.8 lb

## 2019-06-04 DIAGNOSIS — Z01419 Encounter for gynecological examination (general) (routine) without abnormal findings: Secondary | ICD-10-CM

## 2019-06-04 DIAGNOSIS — Z0000 Encounter for general adult medical examination without abnormal findings: Secondary | ICD-10-CM | POA: Diagnosis not present

## 2019-06-04 NOTE — Progress Notes (Signed)
5/27/20219:30 AM  Jordan Hanson Apr 12, 1995, 24 y.o., female [id number]  Chief Complaint  Patient presents with  . Annual Exam  . Gynecologic Exam    HPI:   Patient is a 24 y.o. female with past medical history significant for ADHD, bipolar disorder, borderline personality disorder and depression  who presents today for CPE  G&Ps: 1010 Pap: about 4-5 years ago, denies h/o abnormal STD: declines BC : condoms Menses: menarche at 58, regular period except for past month Mammogram: at age 78 FHX breast/ovarian cancer: unknown FHx colon cancer: unkown Exercise/diet: very active job, average diet  Most Recent Immunizations  Administered Date(s) Administered  . Influenza-Unspecified 09/30/2018  . Pneumococcal Polysaccharide-23 07/20/2014  . Rabies, IM 11/27/2012  . Tdap 05/30/2014  will not be getting covid vaccine  Uses eyeglasses, last eye exam within last year Need to schedule dentist  Reviewed fasting labs done 2 days ago  Depression screen [facility] 2/9 05/18/2019  Decreased Interest 0  Down, Depressed, Hopeless 0  PHQ - 2 Score 0    Fall Risk  06/04/2019 05/18/2019  Falls in the past year? 0 1  Number falls in past yr: 0 0  Injury with Fall? 0 1  Follow up - Falls evaluation completed     Allergies  Allergen Reactions  . Geodon [Ziprasidone Hcl]   . Penicillins Swelling and Rash    Has patient had a PCN reaction causing immediate rash, facial/tongue/throat swelling, SOB or lightheadedness with hypotension:YES Has patient had a PCN reaction causing severe rash involving mucus membranes or skin necrosis: NO Has patient had a PCN reaction that required hospitalization NO Has patient had a PCN reaction occurring within the last 10 years: NO If all of the above answers are "NO", then may proceed with Cephalosporin use.   . Tamsulosin Rash    Prior to Admission medications   Medication Sig Start Date End Date Taking? Authorizing Provider  Acetaminophen (MIDOL PO)  Take 2 tablets by mouth daily as needed (pain).   Yes [provider]  hydrOXYzine (ATARAX/VISTARIL) 25 MG tablet Take 25 mg by mouth at bedtime as needed for anxiety. 03/27/19  Yes [provider]  ibuprofen (ADVIL) 200 MG tablet Take 800 mg by mouth every 6 (six) hours as needed for moderate pain.   Yes [provider]  naproxen (NAPROSYN) 500 MG tablet Take 1 tablet (500 mg total) by mouth 2 (two) times daily with a meal. 06/03/19  Yes Bast, Traci A, NP  naproxen sodium (ALEVE) 220 MG tablet Take 220 mg by mouth.   Yes [provider]  VRAYLAR capsule Take 1.5 mg by mouth daily. 03/28/19  Yes [provider]    Past Medical History:  Diagnosis Date  . ADHD (attention deficit hyperactivity disorder)   . Anxiety   . Bipolar disorder ([facility])   . Depression     Past Surgical History:  Procedure Laterality Date  . SMALL INTESTINE SURGERY    . TONSILLECTOMY      Social History   Tobacco Use  . Smoking status: Current Every Day Smoker    Packs/day: 0.50    Years: 6.00    Pack years: 3.00    Types: Cigarettes  . Smokeless tobacco: Never Used  Substance Use Topics  . Alcohol use: Yes    Comment: occ    Family History  Adopted: Yes    Review of Systems  Constitutional: Negative for chills and fever.  Respiratory: Negative for cough and shortness of  breath.   Cardiovascular: Negative for chest pain, palpitations and leg swelling.  Gastrointestinal: Negative for abdominal pain, nausea and vomiting.  All other systems reviewed and are negative.    OBJECTIVE:  Today's Vitals   06/04/19 0923  BP: 109/74  Pulse: 67  Temp: 98 F (36.7 C)  SpO2: 99%  Weight: 144 lb 12.8 oz (65.7 kg)  Height: 5\' 5"  (1.651 m)   Body mass index is 24.1 kg/m.   Physical Exam Vitals and nursing note reviewed. Exam conducted with a chaperone present.  Constitutional:      Appearance: She is well-developed.  HENT:     Head: Normocephalic and  atraumatic.     Right Ear: Hearing, tympanic membrane, ear canal and external ear normal.     Left Ear: Hearing, tympanic membrane, ear canal and external ear normal.  Eyes:     Conjunctiva/sclera: Conjunctivae normal.     Pupils: Pupils are equal, round, and reactive to light.  Neck:     Thyroid: No thyromegaly.  Cardiovascular:     Rate and Rhythm: Normal rate and regular rhythm.     Pulses: Normal pulses.     Heart sounds: Normal heart sounds. No murmur. No friction rub. No gallop.   Pulmonary:     Effort: Pulmonary effort is normal.     Breath sounds: Normal breath sounds. No wheezing, rhonchi or rales.  Chest:     Breasts: Breasts are symmetrical.        Right: No inverted nipple, mass, nipple discharge, skin change or tenderness.        Left: No inverted nipple, mass, nipple discharge, skin change or tenderness.  Abdominal:     General: Bowel sounds are normal. There is no distension.     Palpations: Abdomen is soft. There is no hepatomegaly, splenomegaly or mass.     Tenderness: There is no abdominal tenderness. There is no guarding.     Hernia: No hernia is present.  Genitourinary:    Labia:        Right: No rash or lesion.        Left: No rash or lesion.      Vagina: No vaginal discharge or erythema.     Cervix: No cervical motion tenderness, discharge or friability.     Uterus: Not enlarged, not fixed and not tender.      Adnexa:        Right: No mass or tenderness.         Left: No mass or tenderness.    Musculoskeletal:        General: Normal range of motion.     Cervical back: Neck supple.     Right lower leg: No edema.     Left lower leg: No edema.  Lymphadenopathy:     Cervical: No cervical adenopathy.     Upper Body:     Right upper body: No supraclavicular, axillary or pectoral adenopathy.     Left upper body: No supraclavicular, axillary or pectoral adenopathy.  Skin:    General: Skin is warm and dry.  Neurological:     General: No focal deficit  present.     Mental Status: She is alert and oriented to person, place, and time.     Cranial Nerves: No cranial nerve deficit.     Gait: Gait normal.     Deep Tendon Reflexes: Reflexes are normal and symmetric.  Psychiatric:        Mood and Affect: Mood normal.  Behavior: Behavior normal.     Results for orders placed or performed during the hospital encounter of 06/03/19 (from the past 24 hour(s))  POCT urinalysis dip (device)     Status: Abnormal   Collection Time: 06/03/19 11:47 AM  Result Value Ref Range   Glucose, UA NEGATIVE NEGATIVE mg/dL   Bilirubin Urine NEGATIVE NEGATIVE   Ketones, ur NEGATIVE NEGATIVE mg/dL   Specific Gravity, Urine >=1.030 1.005 - 1.030   Hgb urine dipstick SMALL (A) NEGATIVE   pH 5.5 5.0 - 8.0   Protein, ur NEGATIVE NEGATIVE mg/dL   Urobilinogen, UA 0.2 0.0 - 1.0 mg/dL   Nitrite NEGATIVE NEGATIVE   Leukocytes,Ua NEGATIVE NEGATIVE  was on her menses  No results found.   ASSESSMENT and PLAN  1. Women's annual routine gynecological examination No concerns per history or exam. HCM reviewed/discussed. Anticipatory guidance regarding healthy weight, lifestyle and choices given.   - Cytology - PAP  Return in about 1 year (around 06/03/2020).    Myles Lipps, MD [facility] [address] [place], [place] [address] Ph.  [phone] Fax [phone]

## 2019-06-04 NOTE — Patient Instructions (Addendum)
   If you have lab work done today you will be contacted with your lab results within the next 2 weeks.  If you have not heard from us then please contact us. The fastest way to get your results is to register for My Chart.   IF you received an x-ray today, you will receive an invoice from [facility]. Please contact  Radiology at [phone] with questions or concerns regarding your invoice.   IF you received labwork today, you will receive an invoice from [facility]. Please contact [facility] at [phone] with questions or concerns regarding your invoice.   Our billing staff will not be able to assist you with questions regarding bills from these companies.  You will be contacted with the lab results as soon as they are available. The fastest way to get your results is to activate your My Chart account. Instructions are located on the last page of this paperwork. If you have not heard from us regarding the results in 2 weeks, please contact this office.     Preventive Care 26-39 Years Old, Female Preventive care refers to visits with your health care provider and lifestyle choices that can promote health and wellness. This includes:  A yearly physical exam. This may also be called an annual well check.  Regular dental visits and eye exams.  Immunizations.  Screening for certain conditions.  Healthy lifestyle choices, such as eating a healthy diet, getting regular exercise, not using drugs or products that contain nicotine and tobacco, and limiting alcohol use. What can I expect for my preventive care visit? Physical exam Your health care provider will check your:  Height and weight. This may be used to calculate body mass index (BMI), which tells if you are at a healthy weight.  Heart rate and blood pressure.  Skin for abnormal spots. Counseling Your health care provider may ask you questions about your:  Alcohol, tobacco, and drug use.  Emotional  well-being.  Home and relationship well-being.  Sexual activity.  Eating habits.  Work and work environment.  Method of birth control.  Menstrual cycle.  Pregnancy history. What immunizations do I need?  Influenza (flu) vaccine  This is recommended every year. Tetanus, diphtheria, and pertussis (Tdap) vaccine  You may need a Td booster every 10 years. Varicella (chickenpox) vaccine  You may need this if you have not been vaccinated. Human papillomavirus (HPV) vaccine  If recommended by your health care provider, you may need three doses over 6 months. Measles, mumps, and rubella (MMR) vaccine  You may need at least one dose of MMR. You may also need a second dose. Meningococcal conjugate (MenACWY) vaccine  One dose is recommended if you are age 26-21 years and a first-year college student living in a residence hall, or if you have one of several medical conditions. You may also need additional booster doses. Pneumococcal conjugate (PCV13) vaccine  You may need this if you have certain conditions and were not previously vaccinated. Pneumococcal polysaccharide (PPSV23) vaccine  You may need one or two doses if you smoke cigarettes or if you have certain conditions. Hepatitis A vaccine  You may need this if you have certain conditions or if you travel or work in places where you may be exposed to hepatitis A. Hepatitis B vaccine  You may need this if you have certain conditions or if you travel or work in places where you may be exposed to hepatitis B. Haemophilus influenzae type b (Hib) vaccine  You   may need this if you have certain conditions. You may receive vaccines as individual doses or as more than one vaccine together in one shot (combination vaccines). Talk with your health care provider about the risks and benefits of combination vaccines. What tests do I need?  Blood tests  Lipid and cholesterol levels. These may be checked every 5 years starting at age  26.  Hepatitis C test.  Hepatitis B test. Screening  Diabetes screening. This is done by checking your blood sugar (glucose) after you have not eaten for a while (fasting).  Sexually transmitted disease (STD) testing.  BRCA-related cancer screening. This may be done if you have a family history of breast, ovarian, tubal, or peritoneal cancers.  Pelvic exam and Pap test. This may be done every 3 years starting at age 26. Starting at age 26, this may be done every 5 years if you have a Pap test in combination with an HPV test. Talk with your health care provider about your test results, treatment options, and if necessary, the need for more tests. Follow these instructions at home: Eating and drinking   Eat a diet that includes fresh fruits and vegetables, whole grains, lean protein, and low-fat dairy.  Take vitamin and mineral supplements as recommended by your health care provider.  Do not drink alcohol if: ? Your health care provider tells you not to drink. ? You are pregnant, may be pregnant, or are planning to become pregnant.  If you drink alcohol: ? Limit how much you have to 0-1 drink a day. ? Be aware of how much alcohol is in your drink. In the [place] one drink equals one 12 oz bottle of beer (355 mL), one 5 oz glass of wine (148 mL), or one 1 oz glass of hard liquor (44 mL). Lifestyle  Take daily care of your teeth and gums.  Stay active. Exercise for at least 30 minutes on 5 or more days each week.  Do not use any products that contain nicotine or tobacco, such as cigarettes, e-cigarettes, and chewing tobacco. If you need help quitting, ask your health care provider.  If you are sexually active, practice safe sex. Use a condom or other form of birth control (contraception) in order to prevent pregnancy and STIs (sexually transmitted infections). If you plan to become pregnant, see your health care provider for a preconception visit. What's next?  Visit your health  care provider once a year for a well check visit.  Ask your health care provider how often you should have your eyes and teeth checked.  Stay up to date on all vaccines. This information is not intended to replace advice given to you by your health care provider. Make sure you discuss any questions you have with your health care provider. Document Revised: 09/05/2017 Document Reviewed: 09/05/2017 Elsevier Patient Education  2020 [employer]  

## 2019-06-05 LAB — CYTOLOGY - PAP: Diagnosis: NEGATIVE

## 2019-06-18 ENCOUNTER — Ambulatory Visit: Payer: Self-pay

## 2019-06-18 ENCOUNTER — Encounter: Payer: Self-pay | Admitting: Physician Assistant

## 2019-06-18 ENCOUNTER — Other Ambulatory Visit: Payer: Self-pay

## 2019-06-18 ENCOUNTER — Ambulatory Visit (INDEPENDENT_AMBULATORY_CARE_PROVIDER_SITE_OTHER): Payer: Medicaid Other | Admitting: Physician Assistant

## 2019-06-18 VITALS — Ht 65.0 in | Wt 144.8 lb

## 2019-06-18 DIAGNOSIS — M25562 Pain in left knee: Secondary | ICD-10-CM | POA: Diagnosis not present

## 2019-06-18 DIAGNOSIS — G8929 Other chronic pain: Secondary | ICD-10-CM

## 2019-06-18 DIAGNOSIS — M25561 Pain in right knee: Secondary | ICD-10-CM

## 2019-06-18 MED ORDER — METHYLPREDNISOLONE ACETATE 40 MG/ML IJ SUSP
40.0000 mg | INTRAMUSCULAR | Status: AC | PRN
  Administered 2019-06-18: 40 mg via INTRA_ARTICULAR

## 2019-06-18 MED ORDER — LIDOCAINE HCL 1 % IJ SOLN
5.0000 mL | INTRAMUSCULAR | Status: AC | PRN
  Administered 2019-06-18: 5 mL

## 2019-06-18 NOTE — Progress Notes (Signed)
Office Visit Note   Patient: Jordan Hanson           Date of Birth: 03-28-1995           MRN: [id number] Visit Date: 06/18/2019              Requested by: Myles Lipps, MD [address] [place],  [place] [address] PCP: Myles Lipps, MD  Chief Complaint  Patient presents with  . Left Knee - New Patient (Initial Visit), Pain  . Right Knee - Pain      HPI: This is a pleasant 24 year old woman with a chief complaint of bilateral knee pain.  She has had a long history of right knee pain.  This is been injected before with some good relief.  She now has had increasing pain in her left knee.  She denies any other joint pain she denies any injuries she denies any significant swelling.  She does not know her family history as she was adopted from [place].  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 Chronic pain of right knee   2. Pain in both knees, unspecified chronicity     Plan: Patient would like to have her knees injected today with cortisone as this helped her in the past after obtaining verbal consent bilateral knees were injected.  I also demonstrated and talked to her about closed chain knee strengthening.  I demonstrated straight leg raises that she could easily do with ankle weights.  Ultimately she should avoid deep squatting as much as she can follow-up if her symptoms return or do not improve  Follow-Up Instructions: No follow-ups on file.   Ortho Exam  Patient is alert, oriented, no adenopathy, well-dressed, normal affect, normal respiratory effort. Bilateral knees no effusions noted no swelling.  Tenderness on the left more than the right around the joint line.  Is negative Lachman sign negative McMurray sign no patellar grind no crepitus with range of motion  Imaging: No results found. No images are attached to the encounter.  Labs: Lab Results    Component Value Date   HGBA1C 5.0 06/01/2019   HGBA1C 5.2 02/12/2016   HGBA1C 5.7 (H) 06/19/2014   REPTSTATUS 04/24/2019 FINAL 04/23/2019   CULT  04/23/2019    NO GROWTH Performed at [facility], [address] [place], [place] [address]      Lab Results  Component Value Date   ALBUMIN 4.6 06/01/2019   ALBUMIN 4.1 04/21/2019   ALBUMIN 4.5 09/07/2017    No results found for: MG No results found for: VD25OH  No results found for: PREALBUMIN CBC EXTENDED Latest Ref Rng & Units 06/01/2019 04/21/2019 01/31/2019  WBC 3.4 - 10.8 x10E3/uL 7.5 8.4 9.1  RBC 3.77 - 5.28 x10E6/uL 4.58 4.51 4.51  HGB 11.1 - 15.9 g/dL 85.8 85.0 27.7  HCT 41.2 - 46.6 % 41.2 40.1 39.5  PLT 150 - 450 x10E3/uL 307 288 262  NEUTROABS 1 - 7 x10E3/uL 4.1 - 4.9  LYMPHSABS 0 - 3 x10E3/uL 2.6 - 3.3     Body mass index is 24.1 kg/m.  Orders:  Orders Placed This Encounter  Procedures  . XR Knee 1-2 Views Left  . XR Knee 1-2 Views Right   No orders of the defined types were placed in this encounter.    Procedures: Large Joint Inj: bilateral knee on 06/18/2019  12:01 PM Indications: pain and diagnostic evaluation Details: 22 G 1.5 in needle, anterolateral approach  Arthrogram: No  Medications (Right): 5 mL lidocaine 1 %; 40 mg methylPREDNISolone acetate 40 MG/ML Medications (Left): 5 mL lidocaine 1 %; 40 mg methylPREDNISolone acetate 40 MG/ML Outcome: tolerated well, no immediate complications Procedure, treatment alternatives, risks and benefits explained, specific risks discussed. Consent was given by the patient.      Clinical Data: No additional findings.  ROS:  All other systems negative, except as noted in the HPI. Review of Systems  Objective: Vital Signs: Ht 5\' 5"  (1.651 m)   Wt 144 lb 12.8 oz (65.7 kg)   LMP 05/30/2019   BMI 24.10 kg/m   Specialty Comments:  No specialty comments available.  PMFS History: Patient Active Problem List   Diagnosis Date Noted  .  Migraines 05/18/2019  . Irregular periods 05/18/2019  . Borderline personality disorder ([place]) 02/13/2016  . Bipolar 1 disorder, depressed ([place]) 02/10/2016  . Suicidal ideation 07/19/2014  . 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 MDD (major depressive disorder) 06/17/2014   Past Medical History:  Diagnosis Date  . ADHD (attention deficit hyperactivity disorder)   . Anxiety   . Bipolar disorder ([place])   . Depression     Family History  Adopted: Yes    Past Surgical History:  Procedure Laterality Date  . SMALL INTESTINE SURGERY    . TONSILLECTOMY     Social History   Occupational History  . Not on file  Tobacco Use  . Smoking status: Current Every Day Smoker    Packs/day: 0.50    Years: 6.00    Pack years: 3.00    Types: Cigarettes  . Smokeless tobacco: Never Used  Vaping Use  . Vaping Use: Never used  Substance and Sexual Activity  . Alcohol use: Yes    Comment: occ  . Drug use: Yes    Types: Marijuana    Comment: smokes weed every day  . Sexual activity: Not on file

## 2019-07-06 ENCOUNTER — Other Ambulatory Visit: Payer: Self-pay

## 2019-07-06 ENCOUNTER — Ambulatory Visit (HOSPITAL_COMMUNITY)
Admission: EM | Admit: 2019-07-06 | Discharge: 2019-07-06 | Disposition: A | Discharge status: Home / Self Care | Payer: Medicaid Other | Attending: Physician Assistant | Admitting: Physician Assistant

## 2019-07-06 ENCOUNTER — Encounter (HOSPITAL_COMMUNITY): Payer: Self-pay

## 2019-07-06 DIAGNOSIS — R1013 Epigastric pain: Secondary | ICD-10-CM | POA: Insufficient documentation

## 2019-07-06 DIAGNOSIS — R197 Diarrhea, unspecified: Secondary | ICD-10-CM | POA: Diagnosis present

## 2019-07-06 DIAGNOSIS — Z3202 Encounter for pregnancy test, result negative: Secondary | ICD-10-CM

## 2019-07-06 DIAGNOSIS — Z20822 Contact with and (suspected) exposure to covid-19: Secondary | ICD-10-CM | POA: Insufficient documentation

## 2019-07-06 LAB — POC URINE PREG, ED: Preg Test, Ur: NEGATIVE

## 2019-07-06 MED ORDER — FAMOTIDINE 20 MG PO TABS
20.0000 mg | ORAL_TABLET | Freq: Two times a day (BID) | ORAL | 0 refills | Status: DC
Start: 2019-07-06 — End: 2019-08-31

## 2019-07-06 MED ORDER — ONDANSETRON HCL 4 MG PO TABS
4.0000 mg | ORAL_TABLET | Freq: Three times a day (TID) | ORAL | 0 refills | Status: DC | PRN
Start: 2019-07-06 — End: 2019-08-31

## 2019-07-06 NOTE — Discharge Instructions (Signed)
Try the pepcid 2 times a day Use zofran only if unable to tolerate water or other liquids Increase liquid intake  If severe belly pain, fever, or unable to tolerate any liquids return or go to the ER  Schedule follow up with your PCP in 1 week

## 2019-07-06 NOTE — ED Notes (Signed)
covid sample placed in lab 

## 2019-07-06 NOTE — ED Triage Notes (Addendum)
Pt presents feeling ill since Friday. Reports constant nausea with emesis in the am after waking and after smoking a cigarette. Pt also endorses generalized abdominal pain. States she had some dizziness yesterday that has subsided. Pt took a pregnancy test on Saturday that was negative. LMP 2 weeks ago.

## 2019-07-06 NOTE — ED Provider Notes (Signed)
[facility]    CSN: [id number] Arrival date & time: 07/06/19  7793      History   Chief Complaint Chief Complaint  Patient presents with  . Nausea    HPI Jordan Hanson is a 24 y.o. female.   Patient reports urgent care for evaluation of morning time nausea and vomiting.  She reports since Saturday morning she is woken up feeling nauseous and had 1 or 2 episodes of emesis.  She reports she is not had much vomiting at all throughout the day following these episodes in the morning.  She reports coffee seems to make it worse first thing in the morning.  She reports the vomit is clear with yellow tinge and occurs before food.  No reported blood in vomit.  She reports she has been eating and drinking throughout the day without much issue.  She does report little increase in looseness of her stools and may be 1 or 2 more episodes daily than what is usual for her.  She reports she normally goes 3 times a day with regard to bowel movements.  No reported blood in stool.  She has had some generalized abdominal cramping on and off.  She believes this might be related to straining from vomiting.  She does report bad breath in the morning.  Patient denies any fever, chills, headache.  Denies sore throat or other respiratory symptoms.  She denied any new foods or unusual foods Friday prior to symptoms starting.  Nobody else has similar symptoms.  She reports she took a pregnancy test Saturday and this was negative.  Denies painful urination, frequency or urgency.  She reports no new medicine changes.  She denies having history of burning sensation in her stomach or reflux symptoms.  She has not tried any medicines for her current symptoms.  Denies abdominal surgeries.       Past Medical History:  Diagnosis Date  . ADHD (attention deficit hyperactivity disorder)   . Anxiety   . Bipolar disorder ([facility])   . Depression     Patient Active Problem List   Diagnosis Date Noted  . Migraines  05/18/2019  . Irregular periods 05/18/2019  . Borderline personality disorder ([facility]) 02/13/2016  . Bipolar 1 disorder, depressed ([facility]) 02/10/2016  . Suicidal ideation 07/19/2014  . 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 MDD (major depressive disorder) 06/17/2014    Past Surgical History:  Procedure Laterality Date  . SMALL INTESTINE SURGERY    . TONSILLECTOMY      OB History   No obstetric history on file.      Home Medications    Prior to Admission medications   Medication Sig Start Date End Date Taking? Authorizing Provider  VRAYLAR capsule Take 1.5 mg by mouth daily. 03/28/19  Yes [provider]  Acetaminophen (MIDOL PO) Take 2 tablets by mouth daily as needed (pain).    [provider]  famotidine (PEPCID) 20 MG tablet Take 1 tablet (20 mg total) by mouth 2 (two) times daily. 07/06/19   Lawrance Wiedemann, Veryl Speak, PA-C  hydrOXYzine (ATARAX/VISTARIL) 25 MG tablet Take 25 mg by mouth at bedtime as needed for anxiety. 03/27/19   [provider]  ibuprofen (ADVIL) 200 MG tablet Take 800 mg by mouth every 6 (six) hours as needed for moderate pain.    [provider]  naproxen (NAPROSYN) 500 MG tablet Take 1 tablet (500 mg total) by mouth 2 (two) times daily with a  meal. 06/03/19   Bast, Gloris Manchester A, NP  naproxen sodium (ALEVE) 220 MG tablet Take 220 mg by mouth.    [provider]  ondansetron (ZOFRAN) 4 MG tablet Take 1 tablet (4 mg total) by mouth every 8 (eight) hours as needed for nausea or vomiting. 07/06/19   Curlee Bogan, Veryl Speak, PA-C    Family History Family History  Adopted: Yes    Social History Social History   Tobacco Use  . Smoking status: Current Every Day Smoker    Packs/day: 0.50    Years: 6.00    Pack years: 3.00    Types: Cigarettes  . Smokeless tobacco: Never Used  Vaping Use  . Vaping Use: Never used  Substance Use Topics  . Alcohol use: Yes    Comment: occ  . Drug use: Yes     Types: Marijuana    Comment: smokes weed every day     Allergies   Geodon [ziprasidone hcl], Penicillins, and Tamsulosin   Review of Systems Review of Systems   Physical Exam Triage Vital Signs ED Triage Vitals  Enc Vitals Group     BP 07/06/19 0900 111/75     Pulse Rate 07/06/19 0900 86     Resp 07/06/19 0900 18     Temp 07/06/19 0900 99.2 F (37.3 C)     Temp src --      SpO2 07/06/19 0900 97 %     Weight --      Height --      Head Circumference --      Peak Flow --      Pain Score 07/06/19 0858 4     Pain Loc --      Pain Edu? --      Excl. in GC? --    No data found.  Updated Vital Signs BP 111/75   Pulse 86   Temp 99.2 F (37.3 C)   Resp 18   LMP 06/22/2019   SpO2 97%   Visual Acuity Right Eye Distance:   Left Eye Distance:   Bilateral Distance:    Right Eye Near:   Left Eye Near:    Bilateral Near:     Physical Exam Vitals and nursing note reviewed.  Constitutional:      General: She is not in acute distress.    Appearance: She is well-developed. She is not ill-appearing.  HENT:     Head: Normocephalic and atraumatic.  Eyes:     Conjunctiva/sclera: Conjunctivae normal.  Cardiovascular:     Rate and Rhythm: Normal rate and regular rhythm.     Heart sounds: No murmur heard.   Pulmonary:     Effort: Pulmonary effort is normal. No respiratory distress.     Breath sounds: Normal breath sounds.  Abdominal:     General: Abdomen is flat. Bowel sounds are normal. There is no distension.     Palpations: Abdomen is soft.     Tenderness: There is abdominal tenderness (Mild generalized tenderness ). There is no right CVA tenderness, left CVA tenderness, guarding or rebound.     Hernia: No hernia is present.  Musculoskeletal:     Cervical back: Neck supple.     Right lower leg: No edema.     Left lower leg: No edema.  Skin:    General: Skin is warm and dry.     Findings: No rash.  Neurological:     General: No focal deficit present.      Mental Status: She  is alert and oriented to person, place, and time.      UC Treatments / Results  Labs (all labs ordered are listed, but only abnormal results are displayed) Labs Reviewed  SARS CORONAVIRUS 2 (TAT 6-24 HRS)  POC URINE PREG, ED    EKG   Radiology No results found.  Procedures Procedures (including critical care time)  Medications Ordered in UC Medications - No data to display  Initial Impression / Assessment and Plan / UC Course  I have reviewed the triage vital signs and the nursing notes.  Pertinent labs & imaging results that were available during my care of the patient were reviewed by me and considered in my medical decision making (see chart for details).     #Dyspepsia #Diarrhea Patient is a 24 year old otherwise healthy female presenting with dyspepsia and diarrhea.  Urine pregnancy negative.  Feel there may be an aspect of acid overproduction given a.m. predominant nausea and vomiting, though she has minimal accompanying symptoms.  Also the differential would be a viral gastroenteritis versus foodborne.  Given nonfocal exam today we will treat symptomatically with close follow-up with primary care.  Famotidine daily and Zofran as needed.  Encourage p.o. hydration.  Return and emergency department precautions discussed. Final Clinical Impressions(s) / UC Diagnoses   Final diagnoses:  Dyspepsia  Diarrhea, unspecified type     Discharge Instructions     Try the pepcid 2 times a day Use zofran only if unable to tolerate water or other liquids Increase liquid intake  If severe belly pain, fever, or unable to tolerate any liquids return or go to the ER  Schedule follow up with your PCP in 1 week     ED Prescriptions    Medication Sig Dispense Auth. Provider   ondansetron (ZOFRAN) 4 MG tablet Take 1 tablet (4 mg total) by mouth every 8 (eight) hours as needed for nausea or vomiting. 4 tablet Zitlali Primm, Veryl Speak, PA-C   famotidine (PEPCID) 20 MG  tablet Take 1 tablet (20 mg total) by mouth 2 (two) times daily. 30 tablet Shontay Wallner, Veryl Speak, PA-C     PDMP not reviewed this encounter.   Hermelinda Medicus, PA-C 07/06/19 1017

## 2019-07-07 LAB — SARS CORONAVIRUS 2 (TAT 6-24 HRS): SARS Coronavirus 2: NEGATIVE

## 2019-08-09 ENCOUNTER — Emergency Department (HOSPITAL_COMMUNITY)
Admission: EM | Admit: 2019-08-09 | Discharge: 2019-08-10 | Disposition: A | Discharge status: Home / Self Care | Payer: Medicaid Other | Attending: Emergency Medicine | Admitting: Emergency Medicine

## 2019-08-09 ENCOUNTER — Encounter (HOSPITAL_COMMUNITY): Payer: Self-pay

## 2019-08-09 ENCOUNTER — Other Ambulatory Visit: Payer: Self-pay

## 2019-08-09 DIAGNOSIS — Z5321 Procedure and treatment not carried out due to patient leaving prior to being seen by health care provider: Secondary | ICD-10-CM | POA: Diagnosis not present

## 2019-08-09 DIAGNOSIS — R109 Unspecified abdominal pain: Secondary | ICD-10-CM | POA: Insufficient documentation

## 2019-08-09 LAB — COMPREHENSIVE METABOLIC PANEL
ALT: 17 U/L (ref 0–44)
AST: 20 U/L (ref 15–41)
Albumin: 4.3 g/dL (ref 3.5–5.0)
Alkaline Phosphatase: 43 U/L (ref 38–126)
Anion gap: 10 (ref 5–15)
BUN: 8 mg/dL (ref 6–20)
CO2: 23 mmol/L (ref 22–32)
Calcium: 9.7 mg/dL (ref 8.9–10.3)
Chloride: 105 mmol/L (ref 98–111)
Creatinine, Ser: 0.63 mg/dL (ref 0.44–1.00)
GFR calc Af Amer: >60 mL/min (ref >60)
GFR calc non Af Amer: >60 mL/min (ref >60)
Glucose, Bld: 81 mg/dL (ref 70–99)
Potassium: 3.7 mmol/L (ref 3.5–5.1)
Sodium: 138 mmol/L (ref 135–145)
Total Bilirubin: 0.7 mg/dL (ref 0.3–1.2)
Total Protein: 6.5 g/dL (ref 6.5–8.1)

## 2019-08-09 LAB — I-STAT CHEM 8, ED
BUN: 10 mg/dL (ref 6–20)
Calcium, Ion: 1.16 mmol/L (ref 1.15–1.40)
Chloride: 102 mmol/L (ref 98–111)
Creatinine, Ser: 0.5 mg/dL (ref 0.44–1.00)
Glucose, Bld: 76 mg/dL (ref 70–99)
HCT: 43 % (ref 36.0–46.0)
Hemoglobin: 14.6 g/dL (ref 12.0–15.0)
Potassium: 3.6 mmol/L (ref 3.5–5.1)
Sodium: 140 mmol/L (ref 135–145)
TCO2: 23 mmol/L (ref 22–32)

## 2019-08-09 LAB — CBC
HCT: 42.6 % (ref 36.0–46.0)
Hemoglobin: 13.9 g/dL (ref 12.0–15.0)
MCH: 29.1 pg (ref 26.0–34.0)
MCHC: 32.6 g/dL (ref 30.0–36.0)
MCV: 89.3 fL (ref 80.0–100.0)
Platelets: 293 10*3/uL (ref 150–400)
RBC: 4.77 MIL/uL (ref 3.87–5.11)
RDW: 12.7 % (ref 11.5–15.5)
WBC: 12.3 10*3/uL — ABNORMAL HIGH (ref 4.0–10.5)
nRBC: 0 % (ref 0.0–0.2)

## 2019-08-09 LAB — I-STAT BETA HCG BLOOD, ED (MC, WL, AP ONLY): I-stat hCG, quantitative: <5 m[IU]/mL (ref <5)
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
## 2019-08-10 ENCOUNTER — Telehealth: Payer: Self-pay | Admitting: Family Medicine

## 2019-08-10 DIAGNOSIS — F603 Borderline personality disorder: Secondary | ICD-10-CM

## 2019-08-10 DIAGNOSIS — F319 Bipolar disorder, unspecified: Secondary | ICD-10-CM

## 2019-08-10 NOTE — ED Notes (Signed)
No answer for room X3 

## 2019-08-10 NOTE — Telephone Encounter (Signed)
Patient is asking for a referral to be faxed over  To [facility] for (behavioral health) to help with on going anxiety / depression  Fax number is [phone].

## 2019-08-11 NOTE — Telephone Encounter (Signed)
Patient has been informed of her referral to [facility] - prefers  [place] location

## 2019-08-11 NOTE — Addendum Note (Signed)
Addended by: Myles Lipps on: 08/11/2019 01:23 PM   Modules accepted: Orders

## 2019-08-11 NOTE — Telephone Encounter (Signed)
Please let patient know referral has been made. thanks

## 2019-08-11 NOTE — Telephone Encounter (Signed)
Patient is requesting a referral to [facility] for Behavioral health for anxiety / depression. Please advice

## 2019-08-31 ENCOUNTER — Other Ambulatory Visit: Payer: Self-pay

## 2019-08-31 ENCOUNTER — Ambulatory Visit (HOSPITAL_COMMUNITY)
Admission: EM | Admit: 2019-08-31 | Discharge: 2019-08-31 | Disposition: A | Discharge status: Home / Self Care | Payer: Medicaid Other | Attending: Physician Assistant | Admitting: Physician Assistant

## 2019-08-31 ENCOUNTER — Encounter (HOSPITAL_COMMUNITY): Payer: Self-pay

## 2019-08-31 DIAGNOSIS — F319 Bipolar disorder, unspecified: Secondary | ICD-10-CM | POA: Diagnosis not present

## 2019-08-31 DIAGNOSIS — Z20822 Contact with and (suspected) exposure to covid-19: Secondary | ICD-10-CM | POA: Insufficient documentation

## 2019-08-31 DIAGNOSIS — R0989 Other specified symptoms and signs involving the circulatory and respiratory systems: Secondary | ICD-10-CM

## 2019-08-31 DIAGNOSIS — R432 Parageusia: Secondary | ICD-10-CM | POA: Diagnosis not present

## 2019-08-31 DIAGNOSIS — B349 Viral infection, unspecified: Secondary | ICD-10-CM | POA: Diagnosis not present

## 2019-08-31 DIAGNOSIS — F419 Anxiety disorder, unspecified: Secondary | ICD-10-CM | POA: Diagnosis not present

## 2019-08-31 DIAGNOSIS — F17210 Nicotine dependence, cigarettes, uncomplicated: Secondary | ICD-10-CM | POA: Diagnosis not present

## 2019-08-31 DIAGNOSIS — R197 Diarrhea, unspecified: Secondary | ICD-10-CM | POA: Diagnosis present

## 2019-08-31 DIAGNOSIS — Z79899 Other long term (current) drug therapy: Secondary | ICD-10-CM | POA: Insufficient documentation

## 2019-08-31 LAB — SARS CORONAVIRUS 2 (TAT 6-24 HRS): SARS Coronavirus 2: NEGATIVE

## 2019-08-31 MED ORDER — LOPERAMIDE HCL 2 MG PO CAPS
2.0000 mg | ORAL_CAPSULE | Freq: Every day | ORAL | 0 refills | Status: DC | PRN
Start: 2019-08-31 — End: 2019-09-08

## 2019-08-31 MED ORDER — BENZONATATE 100 MG PO CAPS: 100.0000 mg | ORAL_CAPSULE | Freq: Three times a day (TID) | ORAL | 0 refills | Status: DC | PRN

## 2019-08-31 MED ORDER — PSEUDOEPHEDRINE HCL 60 MG PO TABS
60.0000 mg | ORAL_TABLET | Freq: Three times a day (TID) | ORAL | 0 refills | Status: DC | PRN
Start: 2019-08-31 — End: 2019-09-08

## 2019-08-31 MED ORDER — PROMETHAZINE-DM 6.25-15 MG/5ML PO SYRP: 5.0000 mL | ORAL_SOLUTION | Freq: Every evening | ORAL | 0 refills | Status: DC | PRN

## 2019-08-31 MED ORDER — CETIRIZINE HCL 10 MG PO TABS: 10.0000 mg | ORAL_TABLET | Freq: Every day | ORAL | 0 refills | Status: DC

## 2019-08-31 NOTE — ED Triage Notes (Signed)
Pt exposed to COVID + coworker last week, started having diarrhea, headache and nausea on Saturday

## 2019-08-31 NOTE — ED Provider Notes (Signed)
[facility]   MRN: [id number] DOB: 04/21/1995  Subjective:   Jordan Hanson is a 24 y.o. female presenting for 2-day history of acute onset altered sense of taste, diarrhea and runny nose.  Patient had positive Covid exposure last week to one of her coworkers.  She has not gotten the vaccination and has no intention to.  Denies fever, chest pain, shortness of breath, coughing.  No current facility-administered medications for this encounter.  Current Outpatient Medications:    Acetaminophen (MIDOL PO), Take 2 tablets by mouth daily as needed (pain)., Disp: , Rfl:    famotidine (PEPCID) 20 MG tablet, Take 1 tablet (20 mg total) by mouth 2 (two) times daily., Disp: 30 tablet, Rfl: 0   hydrOXYzine (ATARAX/VISTARIL) 25 MG tablet, Take 25 mg by mouth at bedtime as needed for anxiety., Disp: , Rfl:    ibuprofen (ADVIL) 200 MG tablet, Take 800 mg by mouth every 6 (six) hours as needed for moderate pain., Disp: , Rfl:    naproxen (NAPROSYN) 500 MG tablet, Take 1 tablet (500 mg total) by mouth 2 (two) times daily with a meal., Disp: 30 tablet, Rfl: 0   naproxen sodium (ALEVE) 220 MG tablet, Take 220 mg by mouth., Disp: , Rfl:    ondansetron (ZOFRAN) 4 MG tablet, Take 1 tablet (4 mg total) by mouth every 8 (eight) hours as needed for nausea or vomiting., Disp: 4 tablet, Rfl: 0   VRAYLAR capsule, Take 1.5 mg by mouth daily., Disp: , Rfl:    Allergies  Allergen Reactions   Geodon [Ziprasidone Hcl]    Penicillins Swelling and Rash    Has patient had a PCN reaction causing immediate rash, facial/tongue/throat swelling, SOB or lightheadedness with hypotension:YES Has patient had a PCN reaction causing severe rash involving mucus membranes or skin necrosis: NO Has patient had a PCN reaction that required hospitalization NO Has patient had a PCN reaction occurring within the last 10 years: NO If all of the above answers are "NO", then may proceed with Cephalosporin use.     Tamsulosin Rash    Past Medical History:  Diagnosis Date   ADHD (attention deficit hyperactivity disorder)    Anxiety    Bipolar disorder ([facility])    Depression      Past Surgical History:  Procedure Laterality Date   SMALL INTESTINE SURGERY     TONSILLECTOMY      Family History  Adopted: Yes    Social History   Tobacco Use   Smoking status: Current Every Day Smoker    Packs/day: 0.50    Years: 6.00    Pack years: 3.00    Types: Cigarettes   Smokeless tobacco: Never Used  Building services engineer Use: Never used  Substance Use Topics   Alcohol use: Yes    Comment: occ   Drug use: Yes    Types: Marijuana    Comment: smokes weed every day    ROS   Objective:   Vitals: BP 115/70    Pulse 79    Temp 97.9 F (36.6 C)    Resp 16    LMP 08/19/2019    SpO2 100%   Physical Exam Constitutional:      General: She is not in acute distress.    Appearance: Normal appearance. She is well-developed. She is not ill-appearing, toxic-appearing or diaphoretic.  HENT:     Head: Normocephalic and atraumatic.     Nose: Nose normal.     Mouth/Throat:  Mouth: Mucous membranes are moist.  Eyes:     Extraocular Movements: Extraocular movements intact.     Pupils: Pupils are equal, round, and reactive to light.  Cardiovascular:     Rate and Rhythm: Normal rate and regular rhythm.     Pulses: Normal pulses.     Heart sounds: Normal heart sounds. No murmur heard.  No friction rub. No gallop.   Pulmonary:     Effort: Pulmonary effort is normal. No respiratory distress.     Breath sounds: Normal breath sounds. No stridor. No wheezing, rhonchi or rales.  Skin:    General: Skin is warm and dry.     Findings: No rash.  Neurological:     Mental Status: She is alert and oriented to person, place, and time.  Psychiatric:        Mood and Affect: Mood normal.        Behavior: Behavior normal.        Thought Content: Thought content normal.        Judgment: Judgment normal.       Assessment and Plan :   PDMP not reviewed this encounter.  1. Viral illness   2. Diarrhea, unspecified type   3. Altered taste   4. Runny nose     Will manage for viral illness such as viral URI, viral syndrome, viral rhinitis, COVID-19. Counseled patient on nature of COVID-19 including modes of transmission, diagnostic testing, management and supportive care.  Offered scripts for symptomatic relief. COVID 19 testing is pending. Counseled patient on potential for adverse effects with medications prescribed/recommended today, ER and return-to-clinic precautions discussed, patient verbalized understanding.     Wallis Bamberg, [place] 08/31/19 [phone]

## 2019-08-31 NOTE — Discharge Instructions (Signed)

## 2019-09-08 ENCOUNTER — Ambulatory Visit: Payer: Medicaid Other | Admitting: Family Medicine

## 2019-09-08 ENCOUNTER — Encounter: Payer: Self-pay | Admitting: Family Medicine

## 2019-09-08 ENCOUNTER — Other Ambulatory Visit: Payer: Self-pay

## 2019-09-08 VITALS — BP 114/78 | HR 90 | Temp 98.1°F | Ht 65.0 in | Wt 143.2 lb

## 2019-09-08 DIAGNOSIS — R002 Palpitations: Secondary | ICD-10-CM

## 2019-09-08 DIAGNOSIS — H53133 Sudden visual loss, bilateral: Secondary | ICD-10-CM

## 2019-09-08 NOTE — Patient Instructions (Signed)
° ° ° °  If you have lab work done today you will be contacted with your lab results within the next 2 weeks.  If you have not heard from us then please contact us. The fastest way to get your results is to register for My Chart. ° ° °IF you received an x-ray today, you will receive an invoice from [facility]. Please contact [facility] at [phone] with questions or concerns regarding your invoice.  ° °IF you received labwork today, you will receive an invoice from [facility]. Please contact [facility] at [phone] with questions or concerns regarding your invoice.  ° °Our billing staff will not be able to assist you with questions regarding bills from these companies. ° °You will be contacted with the lab results as soon as they are available. The fastest way to get your results is to activate your My Chart account. Instructions are located on the last page of this paperwork. If you have not heard from us regarding the results in 2 weeks, please contact this office. °  ° ° ° °

## 2019-09-08 NOTE — Progress Notes (Signed)
8/31/20218:25 AM  Jordan Hanson 25-Dec-1995, 24 y.o., female [id number]  Chief Complaint  Patient presents with  . Palpitations    1 month ago became very dizzy, could not see, very disoriented, feels she may have had a stroke . Heart rate goes very high    HPI:   Patient is a 24 y.o. female who presents today for changes in vision  About a month ago while she was comfortably sitting outside on her porch she had sudden loss of vision, very lightheaded and dizzy, nauseous, got very hot and sweaty, SOB, she could not talk (could not get words out) No chest pain or palpitations No focal weakness or numbness,tinglinging, numbness No headache No LOC, no seizure activity Lasted a few minutes Vision came back when she ran into a wall Has not happened prior nor since Only difference is that she was 4-5 days wo hydroxyzine But she has done that before wo any issues She felt she was going to die She is worried she had a stroke  She also has noticed short bursts of elevated HR, 115s per her fitbit, normally at rest, not associated with CP, SOB, nausea, dizziness, etc.  She was having palpitations prior to above episode but since then they have become daily Not having anything now    Depression screen PHQ 2/9 05/18/2019  Decreased Interest 0  Down, Depressed, Hopeless 0  PHQ - 2 Score 0    Fall Risk  06/04/2019 05/18/2019  Falls in the past year? 0 1  Number falls in past yr: 0 0  Injury with Fall? 0 1  Follow up - Falls evaluation completed     Allergies  Allergen Reactions  . Geodon [Ziprasidone Hcl]   . Penicillins Swelling and Rash    Has patient had a PCN reaction causing immediate rash, facial/tongue/throat swelling, SOB or lightheadedness with hypotension:YES Has patient had a PCN reaction causing severe rash involving mucus membranes or skin necrosis: NO Has patient had a PCN reaction that required hospitalization NO Has patient had a PCN reaction occurring within the  last 10 years: NO If all of the above answers are "NO", then may proceed with Cephalosporin use.   . Tamsulosin Rash    Prior to Admission medications   Medication Sig Start Date End Date Taking? Authorizing Provider  hydrOXYzine (ATARAX/VISTARIL) 25 MG tablet Take 25 mg by mouth at bedtime as needed for anxiety. 03/27/19  Yes [provider]    Past Medical History:  Diagnosis Date  . ADHD (attention deficit hyperactivity disorder)   . Anxiety   . Bipolar disorder (HCC)   . Depression     Past Surgical History:  Procedure Laterality Date  . SMALL INTESTINE SURGERY    . TONSILLECTOMY      Social History   Tobacco Use  . Smoking status: Current Every Day Smoker    Packs/day: 0.50    Years: 6.00    Pack years: 3.00    Types: Cigarettes  . Smokeless tobacco: Never Used  Substance Use Topics  . Alcohol use: Yes    Comment: occ    Family History  Adopted: Yes    ROS Per hpi  OBJECTIVE:  Today's Vitals   09/08/19 0818  BP: 114/78  Pulse: 90  Temp: 98.1 F (36.7 C)  SpO2: 98%  Weight: 143 lb 3.2 oz (65 kg)  Height: 5\' 5"  (1.651 m)   Body mass index is 23.83 kg/m.   Physical Exam Vitals and nursing  note reviewed.  Constitutional:      Appearance: She is well-developed.  HENT:     Head: Normocephalic and atraumatic.     Right Ear: Hearing, tympanic membrane, ear canal and external ear normal.     Left Ear: Hearing, tympanic membrane, ear canal and external ear normal.     Mouth/Throat:     Mouth: Mucous membranes are moist.     Pharynx: No oropharyngeal exudate or posterior oropharyngeal erythema.  Eyes:     Extraocular Movements: Extraocular movements intact.     Conjunctiva/sclera: Conjunctivae normal.     Pupils: Pupils are equal, round, and reactive to light.  Neck:     Thyroid: No thyromegaly.  Cardiovascular:     Rate and Rhythm: Normal rate and regular rhythm.     Heart sounds: Normal heart sounds. No murmur heard.  No friction  rub. No gallop.   Pulmonary:     Effort: Pulmonary effort is normal.     Breath sounds: Normal breath sounds. No wheezing, rhonchi or rales.  Abdominal:     General: Bowel sounds are normal. There is no distension.     Palpations: Abdomen is soft. There is no hepatomegaly, splenomegaly or mass.     Tenderness: There is no abdominal tenderness.  Musculoskeletal:        General: Normal range of motion.     Cervical back: Neck supple.     Right lower leg: No edema.     Left lower leg: No edema.  Lymphadenopathy:     Cervical: No cervical adenopathy.  Skin:    General: Skin is warm and dry.  Neurological:     Mental Status: She is alert and oriented to person, place, and time.     Cranial Nerves: No cranial nerve deficit.     Motor: No weakness.     Coordination: Coordination normal.     Gait: Gait normal.     Deep Tendon Reflexes: Reflexes are normal and symmetric.  Psychiatric:        Mood and Affect: Mood normal.        Behavior: Behavior normal.   My interpretation of EKG:  NSR, HR 65, normal intervals  No results found for this or any previous visit (from the past 24 hour(s)).  No results found.   ASSESSMENT and PLAN  1. Sudden visual loss of both eyes Self resolved. Normal neuro exam. Labs/imagining to r/o inflammatory process. ER precautions given - Sedimentation Rate - C-reactive protein - MR Brain W Wo Contrast; Future  2. Palpitations Labs pending. Referring to cards for further eval and treatment given increased frequency.  - EKG 12-Lead - CBC - Comprehensive metabolic panel - TSH - Ambulatory referral to Cardiology  No follow-ups on file.    Myles Lipps, MD Primary Care at [facility] [address] [place], [place] [address] Ph.  [phone] Fax [phone]

## 2019-09-09 LAB — CBC
Hematocrit: 42.9 % (ref 34.0–46.6)
Hemoglobin: 14 g/dL (ref 11.1–15.9)
MCH: 28.8 pg (ref 26.6–33.0)
MCHC: 32.6 g/dL (ref 31.5–35.7)
MCV: 88 fL (ref 79–97)
Platelets: 286 10*3/uL (ref 150–450)
RBC: 4.86 x10E6/uL (ref 3.77–5.28)
RDW: 12 % (ref 11.7–15.4)
WBC: 10.3 10*3/uL (ref 3.4–10.8)

## 2019-09-09 LAB — C-REACTIVE PROTEIN: CRP: <1 mg/L (ref 0–10)

## 2019-09-09 LAB — COMPREHENSIVE METABOLIC PANEL
ALT: 14 IU/L (ref 0–32)
AST: 17 IU/L (ref 0–40)
Albumin/Globulin Ratio: 2.2 (ref 1.2–2.2)
Albumin: 4.6 g/dL (ref 3.9–5.0)
Alkaline Phosphatase: 60 IU/L (ref 48–121)
BUN/Creatinine Ratio: 16 (ref 9–23)
BUN: 9 mg/dL (ref 6–20)
Bilirubin Total: 0.4 mg/dL (ref 0.0–1.2)
CO2: 22 mmol/L (ref 20–29)
Calcium: 10.1 mg/dL (ref 8.7–10.2)
Chloride: 105 mmol/L (ref 96–106)
Creatinine, Ser: 0.58 mg/dL (ref 0.57–1.00)
GFR calc Af Amer: 147 mL/min/{1.73_m2} (ref >59)
GFR calc non Af Amer: 128 mL/min/{1.73_m2} (ref >59)
Globulin, Total: 2.1 g/dL (ref 1.5–4.5)
Glucose: 82 mg/dL (ref 65–99)
Potassium: 4.5 mmol/L (ref 3.5–5.2)
Sodium: 140 mmol/L (ref 134–144)
Total Protein: 6.7 g/dL (ref 6.0–8.5)

## 2019-09-09 LAB — TSH: TSH: 1.81 u[IU]/mL (ref 0.450–4.500)

## 2019-09-09 LAB — SEDIMENTATION RATE: Sed Rate: 2 mm/hr (ref 0–32)

## 2019-09-28 ENCOUNTER — Ambulatory Visit
Admission: RE | Admit: 2019-09-28 | Discharge: 2019-09-28 | Disposition: A | Discharge status: Home / Self Care | Payer: Medicaid Other | Source: Ambulatory Visit | Attending: Family Medicine | Admitting: Family Medicine

## 2019-09-28 ENCOUNTER — Other Ambulatory Visit: Payer: Self-pay

## 2019-09-28 ENCOUNTER — Other Ambulatory Visit: Payer: Medicaid Other

## 2019-09-28 DIAGNOSIS — H53133 Sudden visual loss, bilateral: Secondary | ICD-10-CM

## 2019-09-28 MED ORDER — GADOBENATE DIMEGLUMINE 529 MG/ML IV SOLN
13.0000 mL | Freq: Once | INTRAVENOUS | Status: AC | PRN
  Administered 2019-09-28: 13 mL via INTRAVENOUS

## 2019-10-02 ENCOUNTER — Other Ambulatory Visit: Payer: Self-pay

## 2019-10-02 ENCOUNTER — Ambulatory Visit: Payer: Medicaid Other | Admitting: Internal Medicine

## 2019-10-02 ENCOUNTER — Encounter: Payer: Self-pay | Admitting: Internal Medicine

## 2019-10-02 ENCOUNTER — Telehealth: Payer: Self-pay | Admitting: Radiology

## 2019-10-02 VITALS — BP 110/60 | HR 100 | Ht 65.0 in | Wt 142.4 lb

## 2019-10-02 DIAGNOSIS — R002 Palpitations: Secondary | ICD-10-CM

## 2019-10-02 NOTE — Telephone Encounter (Signed)
Enrolled patient for a 14 day Zio XT  monitor to be mailed to patients home  °

## 2019-10-02 NOTE — Patient Instructions (Addendum)
Medication Instructions:  NO CHANGES *If you need a refill on your cardiac medications before your next appointment, please call your pharmacy*   Lab Work: NONE If you have labs (blood work) drawn today and your tests are completely normal, you will receive your results only by: Marland Kitchen MyChart Message (if you have MyChart) OR . A paper copy in the mail If you have any lab test that is abnormal or we need to change your treatment, we will call you to review the results.   Testing/Procedures: Christena Deem- Long Term Monitor Instructions   Your physician has requested you wear your ZIO patch monitor__14_____days.   This is a single patch monitor.  Ir[employer] supplies one patch monitor per enrollment.  Additional stickers are not available.   Please do not apply patch if you will be having a Nuclear Stress Test, Echocardiogram, Cardiac CT, MRI, or Chest Xray during the time frame you would be wearing the monitor. The patch cannot be worn during these tests.  You cannot remove and re-apply the ZIO XT patch monitor.   Your ZIO patch monitor will be sent USPS Priority mail from [facility] directly to your home address. The monitor may also be mailed to a PO BOX if home delivery is not available.   It may take 3-5 days to receive your monitor after you have been enrolled.   Once you have received you monitor, please review enclosed instructions.  Your monitor has already been registered assigning a specific monitor serial # to you.   Applying the monitor   Shave hair from upper left chest.   Hold abrader disc by orange tab.  Rub abrader in 40 strokes over left upper chest as indicated in your monitor instructions.   Clean area with 4 enclosed alcohol pads .  Use all pads to assure are is cleaned thoroughly.  Let dry.   Apply patch as indicated in monitor instructions.  Patch will be place under collarbone on left side of chest with arrow pointing upward.   Rub patch adhesive wings for 2  minutes.Remove white label marked "1".  Remove white label marked "2".  Rub patch adhesive wings for 2 additional minutes.   While looking in a mirror, press and release button in center of patch.  A small green light will flash 3-4 times .  This will be your only indicator the monitor has been turned on.     Do not shower for the first 24 hours.  You may shower after the first 24 hours.   Press button if you feel a symptom. You will hear a small click.  Record Date, Time and Symptom in the Patient Log Book.   When you are ready to remove patch, follow instructions on last 2 pages of Patient Log Book.  Stick patch monitor onto last page of Patient Log Book.   Place Patient Log Book in [place] box.  Use locking tab on box and tape box closed securely.  The Orange and [employer] has [employer] on it.  Please place in mailbox as soon as possible.  Your physician should have your test results approximately 7 days after the monitor has been mailed back to [facility].   Call [facility] Customer Care at [phone] if you have questions regarding your ZIO XT patch monitor.  Call them immediately if you see an orange light blinking on your monitor.   If your monitor falls off in less than 4 days contact our Monitor department at  [phone].  If your monitor becomes loose or falls off after 4 days call [employer] at [phone] for suggestions on securing your monitor.     Follow-Up: At [facility], you and your health needs are our priority.  As part of our continuing mission to provide you with exceptional heart care, we have created designated Provider Care Teams.  These Care Teams include your primary Cardiologist (physician) and Advanced Practice Providers (APPs -  Physician Assistants and Nurse Practitioners) who all work together to provide you with the care you need, when you need it.  We recommend signing up for the patient portal called "MyChart".  Sign up information is  provided on this After Visit Summary.  MyChart is used to connect with patients for Virtual Visits (Telemedicine).  Patients are able to view lab/test results, encounter notes, upcoming appointments, etc.  Non-urgent messages can be sent to your provider as well.   To learn more about what you can do with MyChart, go to [email or web address].    Your next appointment:   AS NEEDED PENDING TEST RESULTS   Provider:   Riley Lam, MD   Other Instructions

## 2019-10-02 NOTE — Progress Notes (Signed)
Cardiology Office Note:    Date:  10/02/2019   ID:  Jordan HalterKatia B Hanson, DOB Sep 22, 1995, MRN [id number]  PCP:  Jordan Hanson, Jordan M, MD  [facility] Cardiologist:  Jordan ConstantMahesh A Macallister Ashmead, MD  [facility] Electrophysiologist:  None   Referring MD: Jordan Hanson, Jordan M, MD   CC: Palpitations Consulted for the evaluation of palpitations at the behest of Jordan Hanson, Jordan M, MD   History of Present Illness:    Jordan Hanson is a 24 y.o. female with a hx of anxiety and tobacco use who presents with palpitations.  Patient notes that she had an episode 6-7 months ago that where she felt dizzy.  Felt like she was going out.  Felt like her mouth was moving and nothing came out.  Patient notes that she has spells of rapid heart rate and nausea.  Nothing as bad as that incident prior.  Patient felt near syncopal but did not pass out.  Patient was sitting down prior to this event.  Notes chest pain, but not a specific time during the day or night, not affect by activity.  Associated with shortness of breath.  This constellation of symptoms happens together.  There is not a day that goes by that she does not have these issues.  Associated with new floaters during events.  Past Medical History:  Diagnosis Date   ADHD (attention deficit hyperactivity disorder)    Anxiety    Bipolar disorder (HCC)    Depression     Past Surgical History:  Procedure Laterality Date   SMALL INTESTINE SURGERY     TONSILLECTOMY     Current Medications: Current Meds  Medication Sig   hydrOXYzine (ATARAX/VISTARIL) 25 MG tablet Take 25 mg by mouth at bedtime as needed for anxiety.    Allergies:   Geodon [ziprasidone hcl], Penicillins, and Tamsulosin   Social History   Socioeconomic History   Marital status: Significant Other    Spouse name: Not on file   Number of children: Not on file   Years of education: Not on file   Highest education level: Not on file  Occupational History   Not on file  Tobacco  Use   Smoking status: Current Every Day Smoker    Packs/day: 0.50    Years: 6.00    Pack years: 3.00    Types: Cigarettes   Smokeless tobacco: Never Used  Vaping Use   Vaping Use: Never used  Substance and Sexual Activity   Alcohol use: Yes    Comment: occ   Drug use: Yes    Types: Marijuana    Comment: smokes weed every day   Sexual activity: Not on file  Other Topics Concern   Not on file  Social History Narrative   Not on file   Social Determinants of Health   Financial Resource Strain:    Difficulty of Paying Living Expenses: Not on file  Food Insecurity:    Worried About Running Out of Food in the Last Year: Not on file   Ran Out of Food in the Last Year: Not on file  Transportation Needs:    Lack of Transportation (Medical): Not on file   Lack of Transportation (Non-Medical): Not on file  Physical Activity:    Days of Exercise per Week: Not on file   Minutes of Exercise per Session: Not on file  Stress:    Feeling of Stress : Not on file  Social Connections:    Frequency of Communication with Friends and Family:  Not on file   Frequency of Social Gatherings with Friends and Family: Not on file   Attends Religious Services: Not on file   Active Member of Clubs or Organizations: Not on file   Attends Banker Meetings: Not on file   Marital Status: Not on file    Family History: The patient's family history is not on file. She was adopted.  ROS:   Please see the history of present illness.    All other systems reviewed and are negative.  EKGs/Labs/Other Studies Reviewed:    The following studies were reviewed today:  EKG:   09/08/19 ECG:  SR 65 with artifact at baseline 08/29/19: sinus rhythm rate 65 without evidence of pre-excitation.  Recent Labs: 09/08/2019: ALT 14; BUN 9; Creatinine, Ser 0.58; Hemoglobin 14.0; Platelets 286; Potassium 4.5; Sodium 140; TSH 1.810  Recent Lipid Panel    Component Value Date/Time   CHOL  156 06/01/2019 0854   TRIG 62 06/01/2019 0854   HDL 43 06/01/2019 0854   CHOLHDL 3.6 06/01/2019 0854   CHOLHDL 5.6 02/12/2016 0618   VLDL 27 02/12/2016 0618   LDLCALC 101 (H) 06/01/2019 0854   A1c 5  Physical Exam:    VS:  BP 110/60    Pulse 100    Ht 5\' 5"  (1.651 m)    Wt 142 lb 6.4 oz (64.6 kg)    SpO2 97%    BMI 23.70 kg/m     Wt Readings from Last 3 Encounters:  10/02/19 142 lb 6.4 oz (64.6 kg)  09/08/19 143 lb 3.2 oz (65 kg)  08/09/19 145 lb 8.1 oz (66 kg)     GEN: Well nourished, well developed in no acute distress HEENT: Normal NECK: No JVD; No carotid bruits LYMPHATICS: No lymphadenopathy CARDIAC: RRR, no murmurs, rubs, gallops RESPIRATORY:  Clear to auscultation without rales, wheezing or rhonchi  ABDOMEN: Soft, non-tender, non-distended MUSCULOSKELETAL:  No edema; No deformity  SKIN: Warm and dry NEUROLOGIC:  Alert and oriented x 3 PSYCHIATRIC:  Normal affect   ASSESSMENT:    1. Palpitations    PLAN:    In order of problems listed above:  1. Palpitations in the setting of anxiety - Will do 14 days non-live Ziopatch; to rule out arrhythmia causes  Will see PRN for  follow up unless new symptoms or abnormal test results warranting change in plan   Medication Adjustments/Labs and Tests Ordered: Current medicines are reviewed at length with the patient today.  Concerns regarding medicines are outlined above.  Orders Placed This Encounter  Procedures   LONG TERM MONITOR (3-14 DAYS)   No orders of the defined types were placed in this encounter.   Patient Instructions  Medication Instructions:  NO CHANGES *If you need a refill on your cardiac medications before your next appointment, please call your pharmacy*   Lab Work: NONE If you have labs (blood work) drawn today and your tests are completely normal, you will receive your results only by:  MyChart Message (if you have MyChart) OR  A paper copy in the mail If you have any lab test that is  abnormal or we need to change your treatment, we will call you to review the results.   Testing/Procedures: 10/09/19- Long Term Monitor Instructions   Your physician has requested you wear your ZIO patch monitor__14_____days.   This is a single patch monitor.  [employer] supplies one patch monitor per enrollment.  Additional stickers are not available.   Please do not  apply patch if you will be having a Nuclear Stress Test, Echocardiogram, Cardiac CT, MRI, or Chest Xray during the time frame you would be wearing the monitor. The patch cannot be worn during these tests.  You cannot remove and re-apply the ZIO XT patch monitor.   Your ZIO patch monitor will be sent USPS Priority mail from [facility] directly to your home address. The monitor may also be mailed to a PO BOX if home delivery is not available.   It may take 3-5 days to receive your monitor after you have been enrolled.   Once you have received you monitor, please review enclosed instructions.  Your monitor has already been registered assigning a specific monitor serial # to you.   Applying the monitor   Shave hair from upper left chest.   Hold abrader disc by orange tab.  Rub abrader in 40 strokes over left upper chest as indicated in your monitor instructions.   Clean area with 4 enclosed alcohol pads .  Use all pads to assure are is cleaned thoroughly.  Let dry.   Apply patch as indicated in monitor instructions.  Patch will be place under collarbone on left side of chest with arrow pointing upward.   Rub patch adhesive wings for 2 minutes.Remove white label marked "1".  Remove white label marked "2".  Rub patch adhesive wings for 2 additional minutes.   While looking in a mirror, press and release button in center of patch.  A small green light will flash 3-4 times .  This will be your only indicator the monitor has been turned on.     Do not shower for the first 24 hours.  You may shower after the first 24 hours.     Press button if you feel a symptom. You will hear a small click.  Record Date, Time and Symptom in the Patient Log Book.   When you are ready to remove patch, follow instructions on last 2 pages of Patient Log Book.  Stick patch monitor onto last page of Patient Log Book.   Place Patient Log Book in [place] box.  Use locking tab on box and tape box closed securely.  The [employer] and [employer] has [employer] on it.  Please place in mailbox as soon as possible.  Your physician should have your test results approximately 7 days after the monitor has been mailed back to [facility].   Call [facility] Customer Care at [phone] if you have questions regarding your ZIO XT patch monitor.  Call them immediately if you see an orange light blinking on your monitor.   If your monitor falls off in less than 4 days contact our Monitor department at [phone].  If your monitor becomes loose or falls off after 4 days call [employer] at [phone] for suggestions on securing your monitor.     Follow-Up: At [facility], you and your health needs are our priority.  As part of our continuing mission to provide you with exceptional heart care, we have created designated Provider Care Teams.  These Care Teams include your primary Cardiologist (physician) and Advanced Practice Providers (APPs -  Physician Assistants and Nurse Practitioners) who all work together to provide you with the care you need, when you need it.  We recommend signing up for the patient portal called "MyChart".  Sign up information is provided on this After Visit Summary.  MyChart is used to connect with patients for Virtual Visits (Telemedicine).  Patients are  able to view lab/test results, encounter notes, upcoming appointments, etc.  Non-urgent messages can be sent to your provider as well.   To learn more about what you can do with MyChart, go to [email or web address].    Your next appointment:   AS NEEDED PENDING TEST  RESULTS   Provider:   Riley Lam, MD   Other Instructions      Signed, Jordan Constant, MD  10/02/2019 3:41 PM    [facility]

## 2019-11-16 ENCOUNTER — Encounter (HOSPITAL_COMMUNITY): Payer: Self-pay

## 2019-11-16 ENCOUNTER — Ambulatory Visit (INDEPENDENT_AMBULATORY_CARE_PROVIDER_SITE_OTHER): Payer: Medicaid Other

## 2019-11-16 ENCOUNTER — Ambulatory Visit (HOSPITAL_COMMUNITY)
Admission: RE | Admit: 2019-11-16 | Discharge: 2019-11-16 | Disposition: A | Discharge status: Home / Self Care | Payer: Medicaid Other | Source: Ambulatory Visit | Attending: Nurse Practitioner | Admitting: Nurse Practitioner

## 2019-11-16 ENCOUNTER — Other Ambulatory Visit: Payer: Self-pay

## 2019-11-16 VITALS — BP 119/77 | HR 78 | Temp 99.0°F | Resp 17

## 2019-11-16 DIAGNOSIS — J40 Bronchitis, not specified as acute or chronic: Secondary | ICD-10-CM | POA: Diagnosis present

## 2019-11-16 DIAGNOSIS — R059 Cough, unspecified: Secondary | ICD-10-CM

## 2019-11-16 DIAGNOSIS — R0989 Other specified symptoms and signs involving the circulatory and respiratory systems: Secondary | ICD-10-CM

## 2019-11-16 DIAGNOSIS — J069 Acute upper respiratory infection, unspecified: Secondary | ICD-10-CM | POA: Diagnosis not present

## 2019-11-16 DIAGNOSIS — F17210 Nicotine dependence, cigarettes, uncomplicated: Secondary | ICD-10-CM | POA: Diagnosis not present

## 2019-11-16 DIAGNOSIS — Z20822 Contact with and (suspected) exposure to covid-19: Secondary | ICD-10-CM | POA: Insufficient documentation

## 2019-11-16 LAB — RESP PANEL BY RT PCR (RSV, FLU A&B, COVID)
Influenza A by PCR: NEGATIVE
Influenza B by PCR: NEGATIVE
Respiratory Syncytial Virus by PCR: NEGATIVE
SARS Coronavirus 2 by RT PCR: NEGATIVE

## 2019-11-16 MED ORDER — DOXYCYCLINE HYCLATE 100 MG PO CAPS: 100.0000 mg | ORAL_CAPSULE | Freq: Two times a day (BID) | ORAL | 0 refills | Status: AC

## 2019-11-16 MED ORDER — PREDNISONE 10 MG (21) PO TBPK: ORAL_TABLET | ORAL | 0 refills | Status: DC

## 2019-11-16 MED ORDER — DM-GUAIFENESIN ER 30-600 MG PO TB12: 1.0000 | ORAL_TABLET | Freq: Two times a day (BID) | ORAL | 0 refills | Status: AC

## 2019-11-16 MED ORDER — PROMETHAZINE-DM 6.25-15 MG/5ML PO SYRP: 5.0000 mL | ORAL_SOLUTION | Freq: Four times a day (QID) | ORAL | 0 refills | Status: DC | PRN

## 2019-11-16 NOTE — ED Provider Notes (Signed)
[facility]    CSN: [id number] Arrival date & time: 11/16/19  1251      History   Chief Complaint Chief Complaint  Patient presents with  . Appointment  . URI    HPI SHANNEN FLANSBURG is a 24 y.o. female.   Subjective:   LILLEY HUBBLE is a 24 y.o. female who presents for evaluation of symptoms of a URI. Symptoms include low grade fevers, headache, sneezing, congestion, pressure and chest tightness. The cough is nonproductive. Onset of symptoms was 1 week ago and has gradually worsened over the past two days. She has decreased appetite but is drinking plenty of fluids. She has been taking Dayquil and Nyquil which was helping some but not so much now. No sick contacts in the home. No known COVID-19 exposure. No personal history of COVID-19. She has not been vaccinated against COVID-19.   The following portions of the patient's history were reviewed and updated as appropriate: allergies, current medications, past family history, past medical history, past social history, past surgical history and problem list.        Past Medical History:  Diagnosis Date  . ADHD (attention deficit hyperactivity disorder)   . Anxiety   . Bipolar disorder (HCC)   . Depression     Patient Active Problem List   Diagnosis Date Noted  . Palpitations 10/02/2019  . Migraines 05/18/2019  . Irregular periods 05/18/2019  . Borderline personality disorder (HCC) 02/13/2016  . Bipolar 1 disorder, depressed (HCC) 02/10/2016  . Suicidal ideation 07/19/2014  . 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 MDD (major depressive disorder) 06/17/2014    Past Surgical History:  Procedure Laterality Date  . SMALL INTESTINE SURGERY    . TONSILLECTOMY      OB History   No obstetric history on file.      Home Medications    Prior to Admission medications   Medication Sig Start Date End Date Taking? Authorizing Provider    dextromethorphan-guaiFENesin (MUCINEX DM) 30-600 MG 12hr tablet Take 1 tablet by mouth 2 (two) times daily for 10 days. 11/16/19 11/26/19  Lurline Idol, FNP  doxycycline (VIBRAMYCIN) 100 MG capsule Take 1 capsule (100 mg total) by mouth 2 (two) times daily for 7 days. 11/16/19 11/23/19  Lurline Idol, FNP  hydrOXYzine (ATARAX/VISTARIL) 25 MG tablet Take 25 mg by mouth at bedtime as needed for anxiety. 03/27/19   [provider]  predniSONE (STERAPRED UNI-PAK 21 TAB) 10 MG (21) TBPK tablet Take as directed 11/16/19   Lurline Idol, FNP  promethazine-dextromethorphan (PROMETHAZINE-DM) 6.25-15 MG/5ML syrup Take 5 mLs by mouth 4 (four) times daily as needed for cough. 11/16/19   Lurline Idol, FNP    Family History Family History  Adopted: Yes    Social History Social History   Tobacco Use  . Smoking status: Current Every Day Smoker    Packs/day: 0.50    Years: 6.00    Pack years: 3.00    Types: Cigarettes  . Smokeless tobacco: Never Used  Vaping Use  . Vaping Use: Never used  Substance Use Topics  . Alcohol use: Yes    Comment: occ  . Drug use: Yes    Types: Marijuana    Comment: smokes weed every day     Allergies   Geodon [ziprasidone hcl], Penicillins, and Tamsulosin   Review of Systems Review of Systems  Constitutional: Positive for appetite change, fatigue and fever.  HENT: Positive for congestion, rhinorrhea and sneezing.  Negative for sore throat.   Respiratory: Positive for cough and chest tightness. Negative for shortness of breath and wheezing.   Gastrointestinal: Negative for nausea and vomiting.  Musculoskeletal: Negative for myalgias.  Neurological: Positive for headaches.     Physical Exam Triage Vital Signs ED Triage Vitals  Enc Vitals Group     BP 11/16/19 1326 119/77     Pulse Rate 11/16/19 1326 78     Resp 11/16/19 1326 17     Temp 11/16/19 1326 99 F (37.2 C)     Temp Source 11/16/19 1326 Oral     SpO2 11/16/19 1326 100 %      Weight --      Height --      Head Circumference --      Peak Flow --      Pain Score 11/16/19 1324 6     Pain Loc --      Pain Edu? --      Excl. in GC? --    No data found.  Updated Vital Signs BP 119/77 (BP Location: Left Arm)   Pulse 78   Temp 99 F (37.2 C) (Oral)   Resp 17   LMP 11/15/2019   SpO2 100%   Visual Acuity Right Eye Distance:   Left Eye Distance:   Bilateral Distance:    Right Eye Near:   Left Eye Near:    Bilateral Near:     Physical Exam Vitals reviewed.  Constitutional:      General: She is not in acute distress.    Appearance: Normal appearance. She is ill-appearing. She is not toxic-appearing or diaphoretic.  HENT:     Head: Normocephalic.     Right Ear: Tympanic membrane, ear canal and external ear normal.     Left Ear: Tympanic membrane, ear canal and external ear normal.     Nose: Congestion present.     Mouth/Throat:     Mouth: Mucous membranes are moist.  Eyes:     Conjunctiva/sclera: Conjunctivae normal.  Cardiovascular:     Rate and Rhythm: Normal rate and regular rhythm.  Pulmonary:     Effort: Pulmonary effort is normal. No respiratory distress.     Breath sounds: Normal breath sounds. No wheezing.  Musculoskeletal:        General: Normal range of motion.     Cervical back: Normal range of motion and neck supple.  Lymphadenopathy:     Cervical: No cervical adenopathy.  Skin:    General: Skin is warm and dry.     Coloration: Skin is not jaundiced.  Neurological:     General: No focal deficit present.     Mental Status: She is alert and oriented to person, place, and time.  Psychiatric:        Mood and Affect: Mood normal.        Behavior: Behavior normal.      UC Treatments / Results  Labs (all labs ordered are listed, but only abnormal results are displayed) Labs Reviewed  RESP PANEL BY RT PCR (RSV, FLU A&B, COVID)    EKG   Radiology DG Chest 2 View  Result Date: 11/16/2019 CLINICAL DATA:  Cough and  congestion for 1 week. EXAM: CHEST - 2 VIEW COMPARISON:  09/27/2016 FINDINGS: The heart size and mediastinal contours are within normal limits. Both lungs are clear. The visualized skeletal structures are unremarkable. IMPRESSION: No active cardiopulmonary disease. Electronically Signed   By: Kennith CenterEric  Mansell M.D.   On: 11/16/2019  14:06    Procedures Procedures (including critical care time)  Medications Ordered in UC Medications - No data to display  Initial Impression / Assessment and Plan / UC Course  I have reviewed the triage vital signs and the nursing notes.  Pertinent labs & imaging results that were available during my care of the patient were reviewed by me and considered in my medical decision making (see chart for details).    24 yo female presenting with a one-week history of worsening low grade fevers, headache, sneezing, congestion, pressure, nonproductive cough, runny nose and chest tightness. No known COVID-19 exposure. No personal history of COVID-19. She has not been vaccinated against COVID-19.  Patient appears acutely ill but nontoxic. Low grade fever of 99. All other vital signs are unremarkable. Physical exam unremarkable. She smells strongly of tobacco smoke. Respiratory panel pending. CXR negative for any active cardiopulmonary disease.   Plan:  Discussed diagnosis and treatment of acute bronchitis/URI  Suggested symptomatic OTC remedies. Nasal saline spray for congestion. Smoking cessation encouraged  Isolation per CDC guidelines pending results of COVID testing  Follow up as needed.  Today's evaluation has revealed no signs of a dangerous process. Discussed diagnosis with patient and/or guardian. Patient and/or guardian aware of their diagnosis, possible red flag symptoms to watch out for and need for close follow up. Patient and/or guardian understands verbal and written discharge instructions. Patient and/or guardian comfortable with plan and disposition.  Patient  and/or guardian has a clear mental status at this time, good insight into illness (after discussion and teaching) and has clear judgment to make decisions regarding their care  This care was provided during an unprecedented National Emergency due to the Novel Coronavirus (COVID-19) pandemic. COVID-19 infections and transmission risks place heavy strains on healthcare resources.  As this pandemic evolves, our facility, providers, and staff strive to respond fluidly, to remain operational, and to provide care relative to available resources and information. Outcomes are unpredictable and treatments are without well-defined guidelines. Further, the impact of COVID-19 on all aspects of urgent care, including the impact to patients seeking care for reasons other than COVID-19, is unavoidable during this national emergency. At this time of the global pandemic, management of patients has significantly changed, even for non-COVID positive patients given high local and regional COVID volumes at this time requiring high healthcare system and resource utilization. The standard of care for management of both COVID suspected and non-COVID suspected patients continues to change rapidly at the local, regional, national, and global levels. This patient was worked up and treated to the best available but ever changing evidence and resources available at this current time.   Documentation was completed with the aid of voice recognition software. Transcription may contain typographical errors.   Final Clinical Impressions(s) / UC Diagnoses   Final diagnoses:  Bronchitis  Upper respiratory tract infection, unspecified type     Discharge Instructions     Take medications as prescribed. You may take tylenol or ibuprofen as needed for fevers/headache/body aches. Drink plenty of fluids. Stay in home isolation until you receive results of your COVID test. You will only be notified for positive results. You may go online to  MyChart in the next few days and review your results. Go to the ED immediately if you get worse or develop any other concerning symptoms.      ED Prescriptions    Medication Sig Dispense Auth. Provider   doxycycline (VIBRAMYCIN) 100 MG capsule Take 1 capsule (100 mg total)  by mouth 2 (two) times daily for 7 days. 14 capsule Marriana Hibberd, [place], FNP   predniSONE (STERAPRED UNI-PAK 21 TAB) 10 MG (21) TBPK tablet Take as directed 21 tablet Nicolette Gieske, Lelon Mast, FNP   dextromethorphan-guaiFENesin (MUCINEX DM) 30-600 MG 12hr tablet Take 1 tablet by mouth 2 (two) times daily for 10 days. 20 tablet Lurline Idol, FNP   promethazine-dextromethorphan (PROMETHAZINE-DM) 6.25-15 MG/5ML syrup Take 5 mLs by mouth 4 (four) times daily as needed for cough. 90 mL Lurline Idol, FNP     PDMP not reviewed this encounter.   Lurline Idol, [place] 11/16/19 1413

## 2019-11-16 NOTE — Discharge Instructions (Signed)
Take medications as prescribed. You may take tylenol or ibuprofen as needed for fevers/headache/body aches. Drink plenty of fluids. Stay in home isolation until you receive results of your COVID test. You will only be notified for positive results. You may go online to MyChart in the next few days and review your results. Go to the ED immediately if you get worse or develop any other concerning symptoms.

## 2019-11-16 NOTE — ED Triage Notes (Signed)
Pt presents with loss of voice, non productive cough, chest tightness, congestion, and sinus pressure for over a week.

## 2019-12-17 ENCOUNTER — Other Ambulatory Visit: Payer: Self-pay

## 2019-12-17 ENCOUNTER — Encounter (HOSPITAL_COMMUNITY): Payer: Self-pay | Admitting: Emergency Medicine

## 2019-12-17 ENCOUNTER — Ambulatory Visit (HOSPITAL_COMMUNITY)
Admission: EM | Admit: 2019-12-17 | Discharge: 2019-12-17 | Disposition: A | Discharge status: Home / Self Care | Payer: Medicaid Other | Attending: Family Medicine | Admitting: Family Medicine

## 2019-12-17 DIAGNOSIS — Z20822 Contact with and (suspected) exposure to covid-19: Secondary | ICD-10-CM | POA: Diagnosis not present

## 2019-12-17 DIAGNOSIS — F17210 Nicotine dependence, cigarettes, uncomplicated: Secondary | ICD-10-CM | POA: Insufficient documentation

## 2019-12-17 DIAGNOSIS — F1290 Cannabis use, unspecified, uncomplicated: Secondary | ICD-10-CM | POA: Diagnosis not present

## 2019-12-17 DIAGNOSIS — J069 Acute upper respiratory infection, unspecified: Secondary | ICD-10-CM | POA: Insufficient documentation

## 2019-12-17 DIAGNOSIS — R197 Diarrhea, unspecified: Secondary | ICD-10-CM | POA: Diagnosis not present

## 2019-12-17 DIAGNOSIS — Z3202 Encounter for pregnancy test, result negative: Secondary | ICD-10-CM | POA: Diagnosis not present

## 2019-12-17 LAB — RESP PANEL BY RT-PCR (FLU A&B, COVID) ARPGX2
Influenza A by PCR: NEGATIVE
Influenza B by PCR: NEGATIVE
SARS Coronavirus 2 by RT PCR: NEGATIVE

## 2019-12-17 LAB — POC URINE PREG, ED: Preg Test, Ur: NEGATIVE

## 2019-12-17 MED ORDER — FLUTICASONE PROPIONATE 50 MCG/ACT NA SUSP: 1.0000 | Freq: Every day | NASAL | 0 refills | Status: DC

## 2019-12-17 MED ORDER — LOPERAMIDE HCL 2 MG PO CAPS: 2.0000 mg | ORAL_CAPSULE | Freq: Four times a day (QID) | ORAL | 0 refills | Status: DC | PRN

## 2019-12-17 MED ORDER — CETIRIZINE HCL 10 MG PO TABS: 10.0000 mg | ORAL_TABLET | Freq: Every day | ORAL | 0 refills | Status: DC

## 2019-12-17 NOTE — ED Provider Notes (Signed)
[facility]    CSN: [id number] Arrival date & time: 12/17/19  1039      History   Chief Complaint Chief Complaint  Patient presents with  . Cough    HPI Jordan Hanson is a 24 y.o. female.   Here today with over a month of watery diarrhea 2 times per day and cough, runny nose, fatigue that started several days after the diarrhea. States she was seen seen here 3 weeks ago, treated for URI with doxycycline and cough medications and thinks she may have gotten slightly better for a few days but sxs returned and worsened 3 days ago. Denies fever, chills, body aches, abdominal pain, N/V, dizziness, rashes. No known sick contacts, new medications, recent travel, dietary changes, hx of chronic GI issues. Does smoke cigarettes and marijuana daily. Taking OTC decongestants without much relief.      Past Medical History:  Diagnosis Date  . ADHD (attention deficit hyperactivity disorder)   . Anxiety   . Bipolar disorder (HCC)   . Depression     Patient Active Problem List   Diagnosis Date Noted  . Palpitations 10/02/2019  . Migraines 05/18/2019  . Irregular periods 05/18/2019  . Borderline personality disorder (HCC) 02/13/2016  . Bipolar 1 disorder, depressed (HCC) 02/10/2016  . Suicidal ideation 07/19/2014  . 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 MDD (major depressive disorder) 06/17/2014    Past Surgical History:  Procedure Laterality Date  . SMALL INTESTINE SURGERY    . TONSILLECTOMY      OB History   No obstetric history on file.      Home Medications    Prior to Admission medications   Medication Sig Start Date End Date Taking? Authorizing Provider  cetirizine (ZYRTEC ALLERGY) 10 MG tablet Take 1 tablet (10 mg total) by mouth daily. 12/17/19   Particia Nearing, PA-C  fluticasone [facility]) 50 MCG/ACT nasal spray Place 1 spray into both nostrils daily. 12/17/19   Particia Nearing, PA-C   hydrOXYzine (ATARAX/VISTARIL) 25 MG tablet Take 25 mg by mouth at bedtime as needed for anxiety. 03/27/19   [provider]  loperamide (IMODIUM) 2 MG capsule Take 1 capsule (2 mg total) by mouth 4 (four) times daily as needed for diarrhea or loose stools. 12/17/19   Particia Nearing, PA-C  predniSONE (STERAPRED UNI-PAK 21 TAB) 10 MG (21) TBPK tablet Take as directed 11/16/19   Lurline Idol, FNP  promethazine-dextromethorphan (PROMETHAZINE-DM) 6.25-15 MG/5ML syrup Take 5 mLs by mouth 4 (four) times daily as needed for cough. 11/16/19   Lurline Idol, FNP    Family History Family History  Adopted: Yes    Social History Social History   Tobacco Use  . Smoking status: Current Every Day Smoker    Packs/day: 0.50    Years: 6.00    Pack years: 3.00    Types: Cigarettes  . Smokeless tobacco: Never Used  Vaping Use  . Vaping Use: Never used  Substance Use Topics  . Alcohol use: Yes    Comment: occ  . Drug use: Yes    Types: Marijuana    Comment: smokes weed every day     Allergies   Geodon [ziprasidone hcl], Penicillins, and Tamsulosin   Review of Systems Review of Systems PER HPI  Physical Exam Triage Vital Signs ED Triage Vitals  Enc Vitals Group     BP 12/17/19 1153 112/76     Pulse Rate 12/17/19 1153 89  Resp 12/17/19 1153 18     Temp 12/17/19 1153 98.9 F (37.2 C)     Temp Source 12/17/19 1153 Oral     SpO2 12/17/19 1153 98 %     Weight --      Height --      Head Circumference --      Peak Flow --      Pain Score 12/17/19 1148 7     Pain Loc --      Pain Edu? --      Excl. in GC? --    No data found.  Updated Vital Signs BP 112/76 (BP Location: Left Arm)   Pulse 89   Temp 98.9 F (37.2 C) (Oral)   Resp 18   LMP 12/14/2019   SpO2 98%   Visual Acuity Right Eye Distance:   Left Eye Distance:   Bilateral Distance:    Right Eye Near:   Left Eye Near:    Bilateral Near:     Physical Exam Vitals and nursing note  reviewed.  Constitutional:      Appearance: Normal appearance. She is not ill-appearing.  HENT:     Head: Atraumatic.     Right Ear: Tympanic membrane normal.     Left Ear: Tympanic membrane normal.     Nose: Rhinorrhea present.     Mouth/Throat:     Mouth: Mucous membranes are moist.     Pharynx: Posterior oropharyngeal erythema present. No oropharyngeal exudate.  Eyes:     Extraocular Movements: Extraocular movements intact.     Conjunctiva/sclera: Conjunctivae normal.  Cardiovascular:     Rate and Rhythm: Normal rate and regular rhythm.     Heart sounds: Normal heart sounds.  Pulmonary:     Effort: Pulmonary effort is normal.     Breath sounds: Normal breath sounds. No wheezing or rales.  Abdominal:     General: Bowel sounds are normal. There is no distension.     Palpations: Abdomen is soft.     Tenderness: There is no abdominal tenderness. There is no right CVA tenderness, left CVA tenderness or guarding.  Musculoskeletal:        General: Normal range of motion.     Cervical back: Normal range of motion and neck supple.  Skin:    General: Skin is warm and dry.  Neurological:     Mental Status: She is alert and oriented to person, place, and time.  Psychiatric:        Mood and Affect: Mood normal.        Thought Content: Thought content normal.        Judgment: Judgment normal.      UC Treatments / Results  Labs (all labs ordered are listed, but only abnormal results are displayed) Labs Reviewed  RESP PANEL BY RT-PCR (FLU A&B, COVID) [id number]  POC URINE PREG, ED    EKG   Radiology No results found.  Procedures Procedures (including critical care time)  Medications Ordered in UC Medications - No data to display  Initial Impression / Assessment and Plan / UC Course  I have reviewed the triage vital signs and the nursing notes.  Pertinent labs & imaging results that were available during my care of the patient were reviewed by me and considered in my  medical decision making (see chart for details).     Vitals, exam overall reassuring today. Urine preg neg, repeat resp panel pending. Discussed imodium prn, probiotics, BRAT diet, hydration regarding her diarrhea sxs  but no red flag sxs indicating emergent condition or infection. GI information given for f/u if not resolving. Supportive care and allergy regimen reviewed for ongoing URI sxs. Return for worsening sxs.   Final Clinical Impressions(s) / UC Diagnoses   Final diagnoses:  Viral URI with cough  Diarrhea, unspecified type   Discharge Instructions   None    ED Prescriptions    Medication Sig Dispense Auth. Provider   loperamide (IMODIUM) 2 MG capsule Take 1 capsule (2 mg total) by mouth 4 (four) times daily as needed for diarrhea or loose stools. 16 capsule Particia Nearing, PA-C   fluticasone [facility]) 50 MCG/ACT nasal spray Place 1 spray into both nostrils daily. 16 g Particia Nearing, [place]   cetirizine (ZYRTEC ALLERGY) 10 MG tablet Take 1 tablet (10 mg total) by mouth daily. 30 tablet Particia Nearing, [place]     PDMP not reviewed this encounter.   Particia Nearing, [place] 12/17/19 1535

## 2019-12-17 NOTE — ED Triage Notes (Signed)
Patient seen  11/16/2019, was treated with antibiotics.  Diarrhea for 4 weeks.  Patient has cough and runny nose that started this week.

## 2020-01-19 ENCOUNTER — Encounter (HOSPITAL_COMMUNITY): Payer: Self-pay | Admitting: Emergency Medicine

## 2020-01-19 ENCOUNTER — Other Ambulatory Visit: Payer: Self-pay

## 2020-01-19 ENCOUNTER — Ambulatory Visit (HOSPITAL_COMMUNITY)
Admission: EM | Admit: 2020-01-19 | Discharge: 2020-01-19 | Disposition: A | Discharge status: Home / Self Care | Payer: Medicaid Other | Attending: Emergency Medicine | Admitting: Emergency Medicine

## 2020-01-19 DIAGNOSIS — B349 Viral infection, unspecified: Secondary | ICD-10-CM

## 2020-01-19 DIAGNOSIS — U071 COVID-19: Secondary | ICD-10-CM | POA: Diagnosis not present

## 2020-01-19 NOTE — Discharge Instructions (Addendum)
Your COVID test is pending.  You should self quarantine until the test result is back.    Take Tylenol or ibuprofen as needed for fever or discomfort.  Rest and keep yourself hydrated.    Follow-up with your primary care provider if your symptoms are not improving.     

## 2020-01-19 NOTE — ED Provider Notes (Signed)
[facility]    CSN: [id number] Arrival date & time: 01/19/20  1739      History   Chief Complaint Chief Complaint  Patient presents with  . URI    HPI Jordan Hanson is a 25 y.o. female.   Patient presents with 1 day onset of headache, cough, runny nose, congestion, nausea, diarrhea, decreased appetite, loss of taste and smell.  She denies fever, rash, shortness of breath, vomiting, or other symptoms.  No treatments attempted at home.  Her medical history includes bipolar 1 disorder, borderline personality disorder, anxiety, depression, suicidal ideation, migraines, palpitations.  The history is provided by the patient and medical records.    Past Medical History:  Diagnosis Date  . ADHD (attention deficit hyperactivity disorder)   . Anxiety   . Bipolar disorder (HCC)   . Depression     Patient Active Problem List   Diagnosis Date Noted  . Palpitations 10/02/2019  . Migraines 05/18/2019  . Irregular periods 05/18/2019  . Borderline personality disorder (HCC) 02/13/2016  . Bipolar 1 disorder, depressed (HCC) 02/10/2016  . Suicidal ideation 07/19/2014  . 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 MDD (major depressive disorder) 06/17/2014    Past Surgical History:  Procedure Laterality Date  . SMALL INTESTINE SURGERY    . TONSILLECTOMY      OB History   No obstetric history on file.      Home Medications    Prior to Admission medications   Medication Sig Start Date End Date Taking? Authorizing Provider  cetirizine (ZYRTEC ALLERGY) 10 MG tablet Take 1 tablet (10 mg total) by mouth daily. 12/17/19   Particia Nearing, PA-C  fluticasone [facility]) 50 MCG/ACT nasal spray Place 1 spray into both nostrils daily. 12/17/19   Particia Nearing, PA-C  hydrOXYzine (ATARAX/VISTARIL) 25 MG tablet Take 25 mg by mouth at bedtime as needed for anxiety. 03/27/19   [provider]  loperamide (IMODIUM) 2  MG capsule Take 1 capsule (2 mg total) by mouth 4 (four) times daily as needed for diarrhea or loose stools. 12/17/19   Particia Nearing, PA-C  predniSONE (STERAPRED UNI-PAK 21 TAB) 10 MG (21) TBPK tablet Take as directed 11/16/19   Lurline Idol, FNP  promethazine-dextromethorphan (PROMETHAZINE-DM) 6.25-15 MG/5ML syrup Take 5 mLs by mouth 4 (four) times daily as needed for cough. 11/16/19   Lurline Idol, FNP    Family History Family History  Adopted: Yes    Social History Social History   Tobacco Use  . Smoking status: Current Every Day Smoker    Packs/day: 0.50    Years: 6.00    Pack years: 3.00    Types: Cigarettes  . Smokeless tobacco: Never Used  Vaping Use  . Vaping Use: Never used  Substance Use Topics  . Alcohol use: Yes    Comment: occ  . Drug use: Yes    Types: Marijuana    Comment: smokes weed every day     Allergies   Geodon [ziprasidone hcl], Penicillins, and Tamsulosin   Review of Systems Review of Systems  Constitutional: Positive for appetite change. Negative for chills and fever.  HENT: Positive for congestion and rhinorrhea. Negative for ear pain and sore throat.   Eyes: Negative for pain and visual disturbance.  Respiratory: Positive for cough. Negative for shortness of breath.   Cardiovascular: Negative for chest pain and palpitations.  Gastrointestinal: Positive for diarrhea and nausea. Negative for abdominal pain and vomiting.  Genitourinary:  Negative for dysuria and hematuria.  Musculoskeletal: Negative for arthralgias and back pain.  Skin: Negative for color change and rash.  Neurological: Positive for headaches. Negative for seizures and syncope.  All other systems reviewed and are negative.    Physical Exam Triage Vital Signs ED Triage Vitals  Enc Vitals Group     BP 01/19/20 1826 128/89     Pulse Rate 01/19/20 1826 100     Resp 01/19/20 1826 20     Temp 01/19/20 1826 99.2 F (37.3 C)     Temp Source 01/19/20 1826 Oral      SpO2 01/19/20 1826 99 %     Weight --      Height --      Head Circumference --      Peak Flow --      Pain Score 01/19/20 1828 6     Pain Loc --      Pain Edu? --      Excl. in GC? --    No data found.  Updated Vital Signs BP 128/89 (BP Location: Right Arm)   Pulse 100   Temp 99.2 F (37.3 C) (Oral)   Resp 20   LMP 12/30/2019   SpO2 99%   Visual Acuity Right Eye Distance:   Left Eye Distance:   Bilateral Distance:    Right Eye Near:   Left Eye Near:    Bilateral Near:     Physical Exam Vitals and nursing note reviewed.  Constitutional:      General: She is not in acute distress.    Appearance: She is well-developed and well-nourished. She is not ill-appearing.  HENT:     Head: Normocephalic and atraumatic.     Right Ear: Tympanic membrane normal.     Left Ear: Tympanic membrane normal.     Nose: Nose normal.     Mouth/Throat:     Mouth: Mucous membranes are moist.     Pharynx: Oropharynx is clear.  Eyes:     Conjunctiva/sclera: Conjunctivae normal.  Cardiovascular:     Rate and Rhythm: Normal rate and regular rhythm.     Heart sounds: Normal heart sounds.  Pulmonary:     Effort: Pulmonary effort is normal. No respiratory distress.     Breath sounds: Normal breath sounds.  Abdominal:     Palpations: Abdomen is soft.     Tenderness: There is no abdominal tenderness.  Musculoskeletal:        General: No edema.     Cervical back: Neck supple.  Skin:    General: Skin is warm and dry.  Neurological:     General: No focal deficit present.     Mental Status: She is alert and oriented to person, place, and time.  Psychiatric:        Mood and Affect: Mood and affect normal.      UC Treatments / Results  Labs (all labs ordered are listed, but only abnormal results are displayed) Labs Reviewed  SARS CORONAVIRUS 2 (TAT 6-24 HRS)    EKG   Radiology No results found.  Procedures Procedures (including critical care time)  Medications Ordered  in UC Medications - No data to display  Initial Impression / Assessment and Plan / UC Course  I have reviewed the triage vital signs and the nursing notes.  Pertinent labs & imaging results that were available during my care of the patient were reviewed by me and considered in my medical decision making (see chart for details).  Viral illness.  COVID pending.  Instructed patient to self quarantine until the test results are back.  Discussed symptomatic treatment including Tylenol, rest, hydration.  Instructed patient to follow up with PCP if her symptoms are not improving  Patient agrees to plan of care.    Final Clinical Impressions(s) / UC Diagnoses   Final diagnoses:  Viral illness     Discharge Instructions     Your COVID test is pending.  You should self quarantine until the test result is back.    Take Tylenol or ibuprofen as needed for fever or discomfort.  Rest and keep yourself hydrated.    Follow-up with your primary care provider if your symptoms are not improving.        ED Prescriptions    None     PDMP not reviewed this encounter.   Mickie Bail, NP 01/19/20 [phone]

## 2020-01-19 NOTE — ED Triage Notes (Signed)
Here for cold sx onset 4 days associated w/nausea, loss of smell and loss of taste, cough, runny nose, congestion, headaches, decreased appetite, abd pain, diarrhea  Has not had any meds for sx  Smokes 1.5 ppd  A&O x4... NAD.Marland Kitchen. ambulatory

## 2020-01-20 LAB — SARS CORONAVIRUS 2 (TAT 6-24 HRS): SARS Coronavirus 2: POSITIVE — AB

## 2020-01-21 ENCOUNTER — Telehealth: Payer: Self-pay | Admitting: *Deleted

## 2020-01-21 NOTE — Telephone Encounter (Signed)
Pt calling back to let you know she is interested in treatment.  Please call back

## 2020-01-21 NOTE — Telephone Encounter (Signed)
Called to discuss with patient about COVID-19 symptoms and the use of one of the available treatments for those with mild to moderate Covid symptoms and at a high risk of hospitalization.  Pt appears to qualify for outpatient treatment due to co-morbid conditions and/or a member of an at-risk group in accordance with the FDA Emergency Use Authorization.    Symptom onset: Vaccinated:  Booster?  Immunocompromised?  Qualifiers:   Unable to reach pt - Left VM to return call.  Jordan Hanson   

## 2020-01-22 ENCOUNTER — Telehealth: Payer: Self-pay

## 2020-01-22 DIAGNOSIS — R197 Diarrhea, unspecified: Secondary | ICD-10-CM

## 2020-01-22 DIAGNOSIS — R059 Cough, unspecified: Secondary | ICD-10-CM

## 2020-01-22 DIAGNOSIS — R110 Nausea: Secondary | ICD-10-CM

## 2020-01-22 MED ORDER — ONDANSETRON HCL 4 MG PO TABS: 4.0000 mg | ORAL_TABLET | Freq: Three times a day (TID) | ORAL | 0 refills | Status: DC | PRN

## 2020-01-22 MED ORDER — LOPERAMIDE HCL 2 MG PO CAPS: 2.0000 mg | ORAL_CAPSULE | Freq: Four times a day (QID) | ORAL | 0 refills | Status: DC | PRN

## 2020-01-22 MED ORDER — BENZONATATE 100 MG PO CAPS: 200.0000 mg | ORAL_CAPSULE | Freq: Three times a day (TID) | ORAL | 0 refills | Status: DC | PRN

## 2020-01-22 NOTE — Telephone Encounter (Signed)
Called to discuss with patient about COVID-19 symptoms and the use of one of the available treatments for those with mild to moderate Covid symptoms and at a high risk of hospitalization.  Pt appears to qualify for outpatient treatment due to co-morbid conditions and/or a member of an at-risk group in accordance with the FDA Emergency Use Authorization.    Symptom onset: 01/18/20 Vaccinated: No Booster? No Immunocompromised? No Qualifiers: Migraines, palpitations  Unable to reach pt - Reached pt.  Jordan Hanson

## 2020-01-22 NOTE — Telephone Encounter (Addendum)
I connected by phone with [name] on 01/22/2020 at 1:41 PM to discuss the potential use of a new treatment for mild to moderate COVID-19 viral infection in non-hospitalized patients.  She is low risk for progression to severe COVID 19. No medical history of cardiovascular disease, asthma, CKD. Does not meet current [place] criteria for MAB. Recommend supportive care.  She endorses nausea, no taste no smell, diarrhea, nasal congestion, productive cough, migraine. Denies shortness of breath. She has been taking elderberry, ibuprofen, vitamin C, and vitamin D.   Recommend supportive care. Encouraged to contact her primary care provider. Will Rx short course of Zofran, Immodium, and Tessalon. Recommend OTC Tylenol, Muccinex.   Alver Sorrow, NP 01/22/2020 1:41 PM

## 2020-01-22 NOTE — Addendum Note (Signed)
Addended by: Alver Sorrow on: 01/22/2020 02:19 PM   Modules accepted: Orders

## 2020-01-22 NOTE — Telephone Encounter (Signed)
Unable to e-prescribe due to unclear error. Called pharmacy and called in Rx for Immodium, Tessalon, Zofran.   Alver Sorrow, NP

## 2020-01-25 NOTE — Telephone Encounter (Signed)
Reached out to the patient after she returned MAB Prescreen call. She reported one of out providers has contacted her and called in medication treatment. She continues to feel better each day.

## 2020-03-03 ENCOUNTER — Ambulatory Visit (HOSPITAL_COMMUNITY)
Admission: EM | Admit: 2020-03-03 | Discharge: 2020-03-03 | Disposition: A | Discharge status: Home / Self Care | Payer: Medicaid Other | Attending: Medical Oncology | Admitting: Medical Oncology

## 2020-03-03 ENCOUNTER — Encounter (HOSPITAL_COMMUNITY): Payer: Self-pay | Admitting: Emergency Medicine

## 2020-03-03 ENCOUNTER — Other Ambulatory Visit: Payer: Self-pay

## 2020-03-03 DIAGNOSIS — S0993XA Unspecified injury of face, initial encounter: Secondary | ICD-10-CM

## 2020-03-03 MED ORDER — CLINDAMYCIN HCL 150 MG PO CAPS: 150.0000 mg | ORAL_CAPSULE | Freq: Three times a day (TID) | ORAL | 0 refills | Status: AC

## 2020-03-03 NOTE — ED Provider Notes (Signed)
[facility]    CSN: [id number] Arrival date & time: 03/03/20  1748      History   Chief Complaint Chief Complaint  Patient presents with  . Oral Swelling    HPI Jordan Hanson is a 25 y.o. female.   HPI   Tongue Injury: Patient states that last night she was eating a kebab stick and had a toasted marshmallow and the other hand and was trying to put on her shoes at the same time.  She states that she stumbled and the kebab skewer stuck into her tongue.  She reports that the kebab stick did not penetrate far into her tongue and was easily removed intact.  She reports that this affected the left frontal side of her tongue.  She has been swishing with Listerine which has helped with symptoms some.  She states that she want to make sure that she did not need an antibiotic or other treatment as she does have a bump on the area were the impact was made.  She denies any fever, trouble swallowing, trouble breathing or tongue swelling.   Past Medical History:  Diagnosis Date  . ADHD (attention deficit hyperactivity disorder)   . Anxiety   . Bipolar disorder (HCC)   . Depression     Patient Active Problem List   Diagnosis Date Noted  . Palpitations 10/02/2019  . Migraines 05/18/2019  . Irregular periods 05/18/2019  . Borderline personality disorder (HCC) 02/13/2016  . Bipolar 1 disorder, depressed (HCC) 02/10/2016  . Suicidal ideation 07/19/2014  . 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 MDD (major depressive disorder) 06/17/2014    Past Surgical History:  Procedure Laterality Date  . SMALL INTESTINE SURGERY    . TONSILLECTOMY      OB History   No obstetric history on file.      Home Medications    Prior to Admission medications   Medication Sig Start Date End Date Taking? Authorizing Provider  benzonatate (TESSALON PERLES) 100 MG capsule Take 2 capsules (200 mg total) by mouth 3 (three) times daily as needed  for cough. 01/22/20   Alver Sorrow, NP  cetirizine (ZYRTEC ALLERGY) 10 MG tablet Take 1 tablet (10 mg total) by mouth daily. 12/17/19   Particia Nearing, PA-C  fluticasone [facility]) 50 MCG/ACT nasal spray Place 1 spray into both nostrils daily. 12/17/19   Particia Nearing, PA-C  hydrOXYzine (ATARAX/VISTARIL) 25 MG tablet Take 25 mg by mouth at bedtime as needed for anxiety. 03/27/19   [provider]  loperamide (IMODIUM) 2 MG capsule Take 1 capsule (2 mg total) by mouth 4 (four) times daily as needed for diarrhea or loose stools. 01/22/20   Alver Sorrow, NP  ondansetron (ZOFRAN) 4 MG tablet Take 1 tablet (4 mg total) by mouth every 8 (eight) hours as needed for nausea or vomiting. 01/22/20   Alver Sorrow, NP    Family History Family History  Adopted: Yes    Social History Social History   Tobacco Use  . Smoking status: Current Every Day Smoker    Packs/day: 0.50    Years: 6.00    Pack years: 3.00    Types: Cigarettes  . Smokeless tobacco: Never Used  Vaping Use  . Vaping Use: Never used  Substance Use Topics  . Alcohol use: Yes    Comment: occ  . Drug use: Yes    Types: Marijuana    Comment: smokes weed every  day     Allergies   Geodon [ziprasidone hcl], Penicillins, and Tamsulosin   Review of Systems Review of Systems  As stated above in HPI Physical Exam Triage Vital Signs ED Triage Vitals  Enc Vitals Group     BP 03/03/20 1801 (!) 116/100     Pulse Rate 03/03/20 1801 80     Resp 03/03/20 1801 17     Temp 03/03/20 1801 98.8 F (37.1 C)     Temp Source 03/03/20 1801 Oral     SpO2 03/03/20 1801 100 %     Weight --      Height --      Head Circumference --      Peak Flow --      Pain Score 03/03/20 1758 5     Pain Loc --      Pain Edu? --      Excl. in GC? --    No data found.  Updated Vital Signs BP (!) 116/100 (BP Location: Left Arm)   Pulse 80   Temp 98.8 F (37.1 C) (Oral)   Resp 17   LMP 03/02/2020   SpO2 100%    Physical Exam Vitals and nursing note reviewed.  Constitutional:      General: She is not in acute distress.    Appearance: Normal appearance. She is not ill-appearing, toxic-appearing or diaphoretic.  HENT:     Head: Normocephalic.     Mouth/Throat:     Mouth: Mucous membranes are moist.     Comments: There is a small 40mm area of erythema without visualized foreign body or puncture of the left anterior tongue. Mild immediate surrounding edema. Airway is clear and easily visible.  Cardiovascular:     Rate and Rhythm: Normal rate and regular rhythm.     Heart sounds: Normal heart sounds.  Pulmonary:     Effort: Pulmonary effort is normal.     Breath sounds: Normal breath sounds.  Neurological:     Mental Status: She is alert.      UC Treatments / Results  Labs (all labs ordered are listed, but only abnormal results are displayed) Labs Reviewed - No data to display  EKG   Radiology No results found.  Procedures Procedures (including critical care time)  Medications Ordered in UC Medications - No data to display  Initial Impression / Assessment and Plan / UC Course  I have reviewed the triage vital signs and the nursing notes.  Pertinent labs & imaging results that were available during my care of the patient were reviewed by me and considered in my medical decision making (see chart for details).     New. Treating with clindamycin given PCN allergy. Discussed how to use along with common potential side effects and precautions. Discussed red flag signs and symptoms in terms of injury. She will stop the Listerine swishes as this may cause irritation.  She reports that she is UTD on her Tdap.  Final Clinical Impressions(s) / UC Diagnoses   Final diagnoses:  None   Discharge Instructions   None    ED Prescriptions    None     PDMP not reviewed this encounter.   Rushie Chestnut, [place] 03/03/20 1835

## 2020-03-03 NOTE — ED Triage Notes (Signed)
Pt presents with tongue injury that happened last night. States tripped and fell with kabob stick in mouth. States stick punctured tongue.

## 2020-03-04 ENCOUNTER — Encounter (HOSPITAL_COMMUNITY): Payer: Self-pay

## 2020-03-04 ENCOUNTER — Other Ambulatory Visit: Payer: Self-pay

## 2020-03-04 ENCOUNTER — Ambulatory Visit (HOSPITAL_COMMUNITY)
Admission: EM | Admit: 2020-03-04 | Discharge: 2020-03-04 | Disposition: A | Discharge status: Home / Self Care | Payer: Medicaid Other | Attending: Student | Admitting: Student

## 2020-03-04 DIAGNOSIS — Z23 Encounter for immunization: Secondary | ICD-10-CM

## 2020-03-04 DIAGNOSIS — S0185XA Open bite of other part of head, initial encounter: Secondary | ICD-10-CM

## 2020-03-04 DIAGNOSIS — W540XXA Bitten by dog, initial encounter: Secondary | ICD-10-CM

## 2020-03-04 DIAGNOSIS — S0181XA Laceration without foreign body of other part of head, initial encounter: Secondary | ICD-10-CM | POA: Diagnosis not present

## 2020-03-04 MED ORDER — LIDOCAINE-EPINEPHRINE 1 %-1:100000 IJ SOLN
INTRAMUSCULAR | Status: AC
  Filled 2020-03-04: qty 1

## 2020-03-04 MED ORDER — TETANUS-DIPHTH-ACELL PERTUSSIS 5-2.5-18.5 LF-MCG/0.5 IM SUSY
PREFILLED_SYRINGE | INTRAMUSCULAR | Status: AC
  Filled 2020-03-04: qty 0.5

## 2020-03-04 MED ORDER — TETANUS-DIPHTH-ACELL PERTUSSIS 5-2.5-18.5 LF-MCG/0.5 IM SUSY
0.5000 mL | PREFILLED_SYRINGE | Freq: Once | INTRAMUSCULAR | Status: AC
  Administered 2020-03-04: 0.5 mL via INTRAMUSCULAR

## 2020-03-04 MED ORDER — DOXYCYCLINE HYCLATE 100 MG PO CAPS: 100.0000 mg | ORAL_CAPSULE | Freq: Two times a day (BID) | ORAL | 0 refills | Status: AC

## 2020-03-04 MED ORDER — TRIPLE ANTIBIOTIC 5-400-5000 EX OINT: TOPICAL_OINTMENT | Freq: Two times a day (BID) | CUTANEOUS | 0 refills | Status: AC

## 2020-03-04 NOTE — ED Triage Notes (Signed)
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  Pt states dogs are up to date on vaccines.

## 2020-03-04 NOTE — Discharge Instructions (Addendum)
-  Start the antibiotic- doxycycline, taken twice daily for 7 days. Try to avoid taking this within an hour of eating. If you're going to spend long periods of time in the sun, wear sunscreen. -Leave your bandages on for the next 24 hours (it's okay if you change them, but leave the wounds alone). Starting tomorrow, wash the wounds 1-2x daily with gentle soap and water. Put a small dab of antibacterial ointment on top. Replace with a bandage or gauze.  -Come back and see [place] in about 7 days for suture removal.  -Come and see [place] earlier if it seems like the wounds are getting infected or swollen

## 2020-03-05 NOTE — ED Provider Notes (Signed)
[facility]    CSN: [id number] Arrival date & time: 03/04/20  1636      History   Chief Complaint Chief Complaint  Patient presents with  . Animal Bite    HPI Jordan Hanson is a 25 y.o. female presenting with multiple lacerations following dog bites. History ADHD, bipolar, depression, anxiety, migraines. 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 Without warning, one of the dogs turned on her and attacked. She was able to get away from it, but not before sustaining multiple lacerations to her face/neck. They have continued to bleed despite applying pressure. Stats her lower lip and chin feel numb.  Also states she can taste blood. Denies injuries elsewhere, denies joint pain, denies head trauma/LOC, denies headaches/dizziness/confusion. Tdap UTD 2016. Also currently taking Clindamycin as prescribed for tongue wound (see note from 1 day ago).   HPI  Past Medical History:  Diagnosis Date  . ADHD (attention deficit hyperactivity disorder)   . Anxiety   . Bipolar disorder (HCC)   . Depression     Patient Active Problem List   Diagnosis Date Noted  . Palpitations 10/02/2019  . Migraines 05/18/2019  . Irregular periods 05/18/2019  . Borderline personality disorder (HCC) 02/13/2016  . Bipolar 1 disorder, depressed (HCC) 02/10/2016  . Suicidal ideation 07/19/2014  . 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 MDD (major depressive disorder) 06/17/2014    Past Surgical History:  Procedure Laterality Date  . SMALL INTESTINE SURGERY    . TONSILLECTOMY      OB History   No obstetric history on file.      Home Medications    Prior to Admission medications   Medication Sig Start Date End Date Taking? Authorizing Provider  doxycycline (VIBRAMYCIN) 100 MG capsule Take 1 capsule (100 mg total) by mouth 2 (two) times daily for 7 days. 03/04/20 03/11/20 Yes Rhys Martini, PA-C   neomycin-bacitracin-polymyxin (NEOSPORIN) [phone] ointment Apply topically in the morning and at bedtime for 7 days. 03/04/20 03/11/20 Yes Rhys Martini, PA-C  benzonatate (TESSALON PERLES) 100 MG capsule Take 2 capsules (200 mg total) by mouth 3 (three) times daily as needed for cough. 01/22/20   Alver Sorrow, NP  cetirizine (ZYRTEC ALLERGY) 10 MG tablet Take 1 tablet (10 mg total) by mouth daily. 12/17/19   Particia Nearing, PA-C  clindamycin (CLEOCIN) 150 MG capsule Take 1 capsule (150 mg total) by mouth 3 (three) times daily for 7 days. 03/03/20 03/10/20  Rushie Chestnut, PA-C  fluticasone (FLONASE) 50 MCG/ACT nasal spray Place 1 spray into both nostrils daily. 12/17/19   Particia Nearing, PA-C  hydrOXYzine (ATARAX/VISTARIL) 25 MG tablet Take 25 mg by mouth at bedtime as needed for anxiety. 03/27/19   [provider]  loperamide (IMODIUM) 2 MG capsule Take 1 capsule (2 mg total) by mouth 4 (four) times daily as needed for diarrhea or loose stools. 01/22/20   Alver Sorrow, NP  ondansetron (ZOFRAN) 4 MG tablet Take 1 tablet (4 mg total) by mouth every 8 (eight) hours as needed for nausea or vomiting. 01/22/20   Alver Sorrow, NP    Family History Family History  Adopted: Yes    Social History Social History   Tobacco Use  . Smoking status: Current Every Day Smoker    Packs/day: 0.50    Years: 6.00    Pack years: 3.00    Types:  Cigarettes  . Smokeless tobacco: Never Used  Vaping Use  . Vaping Use: Never used  Substance Use Topics  . Alcohol use: Yes    Comment: occ  . Drug use: Yes    Types: Marijuana    Comment: smokes weed every day     Allergies   Geodon [ziprasidone hcl], Penicillins, and Tamsulosin   Review of Systems Review of Systems  Skin: Positive for wound.  All other systems reviewed and are negative.    Physical Exam Triage Vital Signs ED Triage Vitals  Enc Vitals Group     BP 03/04/20 1828 129/75     Pulse Rate  03/04/20 1828 81     Resp 03/04/20 1828 18     Temp 03/04/20 1828 98 F (36.7 C)     Temp Source 03/04/20 1828 Oral     SpO2 03/04/20 1828 100 %     Weight --      Height --      Head Circumference --      Peak Flow --      Pain Score 03/04/20 1827 7     Pain Loc --      Pain Edu? --      Excl. in GC? --    No data found.  Updated Vital Signs BP 129/75 (BP Location: Right Arm)   Pulse 81   Temp 98 F (36.7 C) (Oral)   Resp 18   LMP 03/02/2020   SpO2 100%   Visual Acuity Right Eye Distance:   Left Eye Distance:   Bilateral Distance:    Right Eye Near:   Left Eye Near:    Bilateral Near:     Physical Exam Vitals reviewed.  Constitutional:      Appearance: Normal appearance.  HENT:     Mouth/Throat:     Comments: Lower inner lip with small 12mm laceration, not actively bleeding.  Eyes:     Extraocular Movements: Extraocular movements intact.     Pupils: Pupils are equal, round, and reactive to light.  Cardiovascular:     Rate and Rhythm: Normal rate and regular rhythm.     Heart sounds: Normal heart sounds.  Pulmonary:     Effort: Pulmonary effort is normal.     Breath sounds: Normal breath sounds.  Skin:    Comments: (See below for images)  Chin with 1cm laceration/puncture wound. Actively bleeding.   Neck with 55mm laceration/puncture wound. Actively bleeding.   Neurological:     General: No focal deficit present.     Mental Status: She is alert and oriented to person, place, and time.     Comments: Decreased sensation to lower lip and chin. Lip and facial movement intact. CN 2-12 grossly intact.  Psychiatric:        Mood and Affect: Mood normal.        Behavior: Behavior normal.        Thought Content: Thought content normal.        Judgment: Judgment normal.               UC Treatments / Results  Labs (all labs ordered are listed, but only abnormal results are displayed) Labs Reviewed - No data to display  EKG   Radiology No  results found.  Procedures Laceration Repair  Date/Time: 03/05/2020 7:16 AM Performed by: Rhys Martini, PA-C Authorized by: Rhys Martini, PA-C   Consent:    Consent obtained:  Verbal   Consent given by:  Patient   Risks, benefits, and alternatives were discussed: yes     Risks discussed:  Infection, need for additional repair, poor cosmetic result and pain   Alternatives discussed:  No treatment Universal protocol:    Procedure explained and questions answered to patient or proxy's satisfaction: yes     Patient identity confirmed:  Verbally with patient Anesthesia:    Anesthesia method:  Topical application and local infiltration   Local anesthetic:  Lidocaine 2% WITH epi Laceration details:    Location:  Face   Face location:  Chin (1cm laceration of chin, 18mm laceration neck) Pre-procedure details:    Preparation:  Patient was prepped and draped in usual sterile fashion Exploration:    Imaging outcome: foreign body not noted     Contaminated: no   Treatment:    Area cleansed with:  Povidone-iodine   Amount of cleaning:  Extensive   Irrigation solution:  Sterile saline Skin repair:    Repair method:  Sutures   Suture size:  6-0   Suture technique:  Simple interrupted   Number of sutures:  3 Approximation:    Approximation:  Close Post-procedure details:    Dressing:  Non-adherent dressing and sterile dressing   Procedure completion:  Tolerated well, no immediate complications   (including critical care time)  Medications Ordered in UC Medications  Tdap (BOOSTRIX) injection 0.5 mL (0.5 mLs Intramuscular Given 03/04/20 1928)    Initial Impression / Assessment and Plan / UC Course  I have reviewed the triage vital signs and the nursing notes.  Pertinent labs & imaging results that were available during my care of the patient were reviewed by me and considered in my medical decision making (see chart for details).     This patient is a 25 year old female  presenting with 2 facial lacerations following dog bites.   2 non-absorbable sutures placed in facial laceration and 1 suture placed in neck laceration. Wounds dressed with sterile gauze. Wound care instructions discussed. Pt states the dog at fault is one she has walked for years, and that it is UTD on shots.   Tdap last administered 2016. readministered today.   Of note, this patient is also taking Clindamycin currently for tongue laceration that she was seen here for 1 day ago.  Return precautions discussed- infection, discharge, new fevers/chills, etc. If continued facial numbness, follow-up with PCP.   Spent over 60 minutes obtaining H&P, performing physical, providing wound care, suturing, discussing treatment plan and plan for follow-up with patient. Patient agrees with plan.     Final Clinical Impressions(s) / UC Diagnoses   Final diagnoses:  Facial laceration, initial encounter  Dog bite of face, initial encounter  Need for Tdap vaccination     Discharge Instructions     -Start the antibiotic- doxycycline, taken twice daily for 7 days. Try to avoid taking this within an hour of eating. If you're going to spend long periods of time in the sun, wear sunscreen. -Leave your bandages on for the next 24 hours (it's okay if you change them, but leave the wounds alone). Starting tomorrow, wash the wounds 1-2x daily with gentle soap and water. Put a small dab of antibacterial ointment on top. Replace with a bandage or gauze.  -Come back and see [place] in about 7 days for suture removal.  -Come and see [place] earlier if it seems like the wounds are getting infected or swollen    ED Prescriptions    Medication Sig Dispense Auth. Provider  doxycycline (VIBRAMYCIN) 100 MG capsule Take 1 capsule (100 mg total) by mouth 2 (two) times daily for 7 days. 14 capsule Rhys MartiniGraham, Domenique Quest E, PA-C   neomycin-bacitracin-polymyxin (NEOSPORIN) [phone] ointment Apply topically in the morning and at bedtime for  7 days. 28.3 g Rhys MartiniGraham, Jassica Zazueta E, PA-C     PDMP not reviewed this encounter.   Rhys MartiniGraham, Darletta Noblett E, PA-C 03/05/20 [phone]

## 2020-03-12 ENCOUNTER — Ambulatory Visit (HOSPITAL_COMMUNITY)
Admission: EM | Admit: 2020-03-12 | Discharge: 2020-03-12 | Disposition: A | Discharge status: Home / Self Care | Payer: Medicaid Other | Attending: Family Medicine | Admitting: Family Medicine

## 2020-03-12 ENCOUNTER — Encounter (HOSPITAL_COMMUNITY): Payer: Self-pay

## 2020-03-12 ENCOUNTER — Other Ambulatory Visit: Payer: Self-pay

## 2020-03-12 ENCOUNTER — Ambulatory Visit (INDEPENDENT_AMBULATORY_CARE_PROVIDER_SITE_OTHER): Payer: Medicaid Other

## 2020-03-12 DIAGNOSIS — R109 Unspecified abdominal pain: Secondary | ICD-10-CM | POA: Diagnosis not present

## 2020-03-12 DIAGNOSIS — M5136 Other intervertebral disc degeneration, lumbar region: Secondary | ICD-10-CM | POA: Insufficient documentation

## 2020-03-12 DIAGNOSIS — M549 Dorsalgia, unspecified: Secondary | ICD-10-CM

## 2020-03-12 DIAGNOSIS — M545 Low back pain, unspecified: Secondary | ICD-10-CM | POA: Diagnosis present

## 2020-03-12 DIAGNOSIS — Z4802 Encounter for removal of sutures: Secondary | ICD-10-CM

## 2020-03-12 DIAGNOSIS — S0993XD Unspecified injury of face, subsequent encounter: Secondary | ICD-10-CM

## 2020-03-12 LAB — POCT URINALYSIS DIPSTICK, ED / UC
Bilirubin Urine: NEGATIVE
Glucose, UA: NEGATIVE mg/dL
Ketones, ur: NEGATIVE mg/dL
Leukocytes,Ua: NEGATIVE
Nitrite: NEGATIVE
Protein, ur: NEGATIVE mg/dL
Specific Gravity, Urine: >=1.03 (ref 1.005–1.030)
Urobilinogen, UA: 0.2 mg/dL (ref 0.0–1.0)
pH: 5.5 (ref 5.0–8.0)

## 2020-03-12 MED ORDER — CYCLOBENZAPRINE HCL 10 MG PO TABS
10.0000 mg | ORAL_TABLET | Freq: Two times a day (BID) | ORAL | 0 refills | Status: DC | PRN
Start: 2020-03-12 — End: 2020-03-20

## 2020-03-12 MED ORDER — PREDNISONE 20 MG PO TABS: 40.0000 mg | ORAL_TABLET | Freq: Every day | ORAL | 0 refills | Status: AC

## 2020-03-12 NOTE — ED Provider Notes (Signed)
[facility]    CSN: [id number] Arrival date & time: 03/12/20  1016      History   Chief Complaint Chief Complaint  Patient presents with  . Flank Pain  . Back Pain    HPI Jordan Hanson is a 25 y.o. female.   HPI  Patient presents today for suture removal and wound check following a dog attack injury x1 week ago. Patient denies any oozing or drainage from laceration site repair sites.  She endorses continued numbness of the left lower mandible region in which the dog bit her.  She also endorses some continued pain in this region also.  She is having to eat and chew on the right side of her mouth only.  She also has some trace swelling at the left lower facial region.  Patient reports following the dog attack she noticed bruising and tenderness to the lumbar region of her lower back.  She also noticed a bruise to her low left glued.  Since that time she has had progressively worsening lower back pain.  During the dog attack she landed flat onto her lower back.  She reports that her lower back sustained the majority of the impact during the fall.  Denies any history of recurrent low back pain.  She has been taking over-the-counter NSAIDs without relief of pain.  Past Medical History:  Diagnosis Date  . ADHD (attention deficit hyperactivity disorder)   . Anxiety   . Bipolar disorder (HCC)   . Depression     Patient Active Problem List   Diagnosis Date Noted  . Palpitations 10/02/2019  . Migraines 05/18/2019  . Irregular periods 05/18/2019  . Borderline personality disorder (HCC) 02/13/2016  . Bipolar 1 disorder, depressed (HCC) 02/10/2016  . Suicidal ideation 07/19/2014  . 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 MDD (major depressive disorder) 06/17/2014    Past Surgical History:  Procedure Laterality Date  . SMALL INTESTINE SURGERY    . TONSILLECTOMY      OB History   No obstetric history on file.       Home Medications    Prior to Admission medications   Medication Sig Start Date End Date Taking? Authorizing Provider  benzonatate (TESSALON PERLES) 100 MG capsule Take 2 capsules (200 mg total) by mouth 3 (three) times daily as needed for cough. 01/22/20   Alver Sorrow, NP  cetirizine (ZYRTEC ALLERGY) 10 MG tablet Take 1 tablet (10 mg total) by mouth daily. 12/17/19   Particia Nearing, PA-C  fluticasone [facility]) 50 MCG/ACT nasal spray Place 1 spray into both nostrils daily. 12/17/19   Particia Nearing, PA-C  hydrOXYzine (ATARAX/VISTARIL) 25 MG tablet Take 25 mg by mouth at bedtime as needed for anxiety. 03/27/19   [provider]  loperamide (IMODIUM) 2 MG capsule Take 1 capsule (2 mg total) by mouth 4 (four) times daily as needed for diarrhea or loose stools. 01/22/20   Alver Sorrow, NP  ondansetron (ZOFRAN) 4 MG tablet Take 1 tablet (4 mg total) by mouth every 8 (eight) hours as needed for nausea or vomiting. 01/22/20   Alver Sorrow, NP    Family History Family History  Adopted: Yes    Social History Social History   Tobacco Use  . Smoking status: Current Every Day Smoker    Packs/day: 0.50    Years: 6.00    Pack years: 3.00    Types: Cigarettes  . Smokeless tobacco: Never Used  Vaping Use  . Vaping Use: Never used  Substance Use Topics  . Alcohol use: Yes    Comment: occ  . Drug use: Yes    Types: Marijuana    Comment: smokes weed every day     Allergies   Geodon [ziprasidone hcl], Penicillins, and Tamsulosin   Review of Systems Review of Systems Pertinent negatives listed in HPI   Physical Exam Triage Vital Signs ED Triage Vitals  Enc Vitals Group     BP 03/12/20 1058 110/72     Pulse Rate 03/12/20 1058 86     Resp 03/12/20 1058 18     Temp 03/12/20 1058 98.8 F (37.1 C)     Temp Source 03/12/20 1058 Oral     SpO2 03/12/20 1058 99 %     Weight --      Height --      Head Circumference --      Peak Flow --       Pain Score 03/12/20 1057 6     Pain Loc --      Pain Edu? --      Excl. in GC? --    No data found.  Updated Vital Signs BP 110/72 (BP Location: Left Arm)   Pulse 86   Temp 98.8 F (37.1 C) (Oral)   Resp 18   LMP 03/02/2020   SpO2 99%   Visual Acuity Right Eye Distance:   Left Eye Distance:   Bilateral Distance:    Right Eye Near:   Left Eye Near:    Bilateral Near:     Physical Exam HENT:     Head:   Eyes:     General: Lids are normal.  Cardiovascular:     Rate and Rhythm: Normal rate and regular rhythm.  Pulmonary:     Effort: Pulmonary effort is normal.     Breath sounds: Normal breath sounds and air entry.  Musculoskeletal:       Back:  Neurological:     General: No focal deficit present.     Mental Status: She is alert and oriented to person, place, and time.     GCS: GCS eye subscore is 4. GCS verbal subscore is 5. GCS motor subscore is 6.  Psychiatric:        Attention and Perception: Attention normal.        Mood and Affect: Mood normal.        Speech: Speech normal.      UC Treatments / Results  Labs (all labs ordered are listed, but only abnormal results are displayed) Labs Reviewed  POCT URINALYSIS DIPSTICK, ED / UC - Abnormal; Notable for the following components:      Result Value   Hgb urine dipstick MODERATE (*)    All other components within normal limits  URINE CULTURE    EKG   Radiology No results found.  Procedures Procedures (including critical care time)  Medications Ordered in UC Medications - No data to display  Initial Impression / Assessment and Plan / UC Course  I have reviewed the triage vital signs and the nursing notes.  Pertinent labs & imaging results that were available during my care of the patient were reviewed by me and considered in my medical decision making (see chart for details).    Imaging of the lumbar spine significant for only chronic changes no acute vertebrae injuries related to dog attack.   We will treat with prednisone 40 mg once daily for total  5 days for acute pain cyclobenzaprine.  Precautions given to avoid taking muscle relaxers while driving.  Patient also endorsed some loss of sensation in your left portion of her face where the dog laceration occurred.  Patient has Medicaid and no primary care provider provided information to get established with primary care as patient will need further work-up and evaluation by a neurologist if facial numbness persist.  Patient verbalized understanding and agreement with plan and will contact primary care on Monday to get established. Final Clinical Impressions(s) / UC Diagnoses   Final diagnoses:  Facial injury, subsequent encounter  Acute bilateral low back pain without sciatica   Discharge Instructions   None    ED Prescriptions    None     PDMP not reviewed this encounter.   Bing Neighbors, FNP 03/12/20 1228

## 2020-03-12 NOTE — ED Triage Notes (Signed)
Pt presents for suture removal from left cheek and neck area from injury a week ago.  Pt also presents for left side flank pain & back pain believed to be stemming from injury a week ago that was not noticed on day of injury.

## 2020-03-12 NOTE — ED Notes (Signed)
2 sutures removed from left cheek 1 suture removed from neck

## 2020-03-12 NOTE — Discharge Instructions (Addendum)
  Get established with primary care provider in order to have a referral placed to neurology if the sensation in your lower facial area has not improved.  For low back pain I am treating you with prednisone 40 mg once daily for the next 5 days.  For acute pain cyclobenzaprine you may take 1 tablet twice daily.  Muscle relaxers can cause significant drowsiness therefore avoid driving or working while taking medication.  Imaging of your back did not show any acute changes however did show some degenerative disc changes which is similar to that of arthritic changes in your lower back which likely resulted in increased pain after your subsequent injury.

## 2020-03-13 LAB — URINE CULTURE
Culture: <10000 — AB
Special Requests: NORMAL

## 2020-03-20 ENCOUNTER — Encounter (HOSPITAL_COMMUNITY): Payer: Self-pay

## 2020-03-20 ENCOUNTER — Ambulatory Visit (HOSPITAL_COMMUNITY)
Admission: EM | Admit: 2020-03-20 | Discharge: 2020-03-20 | Disposition: A | Discharge status: Home / Self Care | Payer: Medicaid Other | Attending: Family Medicine | Admitting: Family Medicine

## 2020-03-20 ENCOUNTER — Other Ambulatory Visit: Payer: Self-pay

## 2020-03-20 ENCOUNTER — Ambulatory Visit (INDEPENDENT_AMBULATORY_CARE_PROVIDER_SITE_OTHER): Payer: Medicaid Other

## 2020-03-20 DIAGNOSIS — S2020XA Contusion of thorax, unspecified, initial encounter: Secondary | ICD-10-CM

## 2020-03-20 DIAGNOSIS — S20212A Contusion of left front wall of thorax, initial encounter: Secondary | ICD-10-CM

## 2020-03-20 DIAGNOSIS — R0781 Pleurodynia: Secondary | ICD-10-CM

## 2020-03-20 MED ORDER — CYCLOBENZAPRINE HCL 10 MG PO TABS: 10.0000 mg | ORAL_TABLET | Freq: Three times a day (TID) | ORAL | 0 refills | Status: DC | PRN
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
## 2020-05-03 ENCOUNTER — Ambulatory Visit (HOSPITAL_COMMUNITY): Payer: Self-pay

## 2020-07-14 ENCOUNTER — Emergency Department (HOSPITAL_COMMUNITY)
Admission: EM | Admit: 2020-07-14 | Discharge: 2020-07-14 | Disposition: A | Discharge status: Home / Self Care | Payer: Medicaid Other | Attending: Emergency Medicine | Admitting: Emergency Medicine

## 2020-07-14 ENCOUNTER — Encounter (HOSPITAL_COMMUNITY): Payer: Self-pay | Admitting: Emergency Medicine

## 2020-07-14 DIAGNOSIS — R110 Nausea: Secondary | ICD-10-CM | POA: Diagnosis not present

## 2020-07-14 DIAGNOSIS — R42 Dizziness and giddiness: Secondary | ICD-10-CM | POA: Diagnosis not present

## 2020-07-14 DIAGNOSIS — Z8669 Personal history of other diseases of the nervous system and sense organs: Secondary | ICD-10-CM | POA: Diagnosis not present

## 2020-07-14 DIAGNOSIS — Z5321 Procedure and treatment not carried out due to patient leaving prior to being seen by health care provider: Secondary | ICD-10-CM | POA: Insufficient documentation

## 2020-07-14 DIAGNOSIS — R519 Headache, unspecified: Secondary | ICD-10-CM | POA: Diagnosis not present

## 2020-07-14 NOTE — ED Triage Notes (Signed)
Patient here for complaint of persistent headache since Monday, no history of migraines. States she has tried multiple OTC medications without relief. Also reports intermittent dizziness, no focal neuro deficits. Patient alert, oriented, and in no apparent distress at this time.

## 2020-07-14 NOTE — ED Notes (Signed)
Pt name called 3x for vitals, no response  

## 2020-07-14 NOTE — ED Provider Notes (Signed)
Emergency Medicine Provider Triage Evaluation Note  Jordan Hanson , a 25 y.o. female  was evaluated in triage.  Pt complains of migraine since Monday.  Headache is constant, associated with nausea and one episode of emesis.  History of migraines, usually related naproxen Aleve.  Not this time.  Reports feeling more out of balance than normal.  Patient also has self-inflicted scars to the left arm.  Denies any SI or HI right now.  She sees a Warden/ranger and a therapist.  Contract to safety.  Review of Systems  Positive: Migraine, nausea, vomiting Negative: Vision changes, head trauma  Physical Exam  BP 120/77   Pulse 87   Temp 98.8 F (37.1 C) (Oral)   Resp 14   SpO2 98%  Gen:   Awake, no distress   Resp:  Normal effort  MSK:   Moves extremities without difficulty  Other:  Cranial nerves III through XII grossly intact.  Grip strength equal bilaterally.  Lower extremity strength 5 out of 5.  Medical Decision Making  Medically screening exam initiated at 11:35 AM.  Appropriate orders placed.  FAYLENE ALLERTON was informed that the remainder of the evaluation will be completed by another provider, this initial triage assessment does not replace that evaluation, and the importance of remaining in the ED until their evaluation is complete.     Theron Arista, PA-C 07/14/20 1137    Mancel Bale, MD 07/16/20 [phone]

## 2020-07-18 ENCOUNTER — Ambulatory Visit (INDEPENDENT_AMBULATORY_CARE_PROVIDER_SITE_OTHER): Payer: Medicaid Other | Admitting: Licensed Clinical Social Worker

## 2020-07-18 ENCOUNTER — Other Ambulatory Visit: Payer: Self-pay

## 2020-07-18 ENCOUNTER — Encounter (HOSPITAL_COMMUNITY): Payer: Self-pay | Admitting: Licensed Clinical Social Worker

## 2020-07-18 DIAGNOSIS — F319 Bipolar disorder, unspecified: Secondary | ICD-10-CM | POA: Diagnosis not present

## 2020-07-18 DIAGNOSIS — F411 Generalized anxiety disorder: Secondary | ICD-10-CM | POA: Diagnosis not present

## 2020-07-18 DIAGNOSIS — F321 Major depressive disorder, single episode, moderate: Secondary | ICD-10-CM
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
## 2020-07-25 ENCOUNTER — Telehealth (HOSPITAL_COMMUNITY): Payer: Medicaid Other | Admitting: Psychiatry

## 2020-07-30 ENCOUNTER — Other Ambulatory Visit: Payer: Self-pay

## 2020-07-30 ENCOUNTER — Emergency Department (HOSPITAL_COMMUNITY): Payer: Medicaid Other

## 2020-07-30 ENCOUNTER — Inpatient Hospital Stay (HOSPITAL_COMMUNITY)
Admission: EM | Admit: 2020-07-30 | Discharge: 2020-08-01 | DRG: 200 | Disposition: A | Discharge status: Home / Self Care | Payer: Medicaid Other | Attending: Surgery | Admitting: Surgery

## 2020-07-30 DIAGNOSIS — J9811 Atelectasis: Secondary | ICD-10-CM | POA: Diagnosis not present

## 2020-07-30 DIAGNOSIS — F411 Generalized anxiety disorder: Secondary | ICD-10-CM | POA: Diagnosis present

## 2020-07-30 DIAGNOSIS — Y9383 Activity, rough housing and horseplay: Secondary | ICD-10-CM

## 2020-07-30 DIAGNOSIS — Z79899 Other long term (current) drug therapy: Secondary | ICD-10-CM | POA: Diagnosis not present

## 2020-07-30 DIAGNOSIS — J939 Pneumothorax, unspecified: Secondary | ICD-10-CM

## 2020-07-30 DIAGNOSIS — Z20822 Contact with and (suspected) exposure to covid-19: Secondary | ICD-10-CM | POA: Diagnosis present

## 2020-07-30 DIAGNOSIS — F909 Attention-deficit hyperactivity disorder, unspecified type: Secondary | ICD-10-CM | POA: Diagnosis present

## 2020-07-30 DIAGNOSIS — Z791 Long term (current) use of non-steroidal anti-inflammatories (NSAID): Secondary | ICD-10-CM

## 2020-07-30 DIAGNOSIS — S270XXA Traumatic pneumothorax, initial encounter: Principal | ICD-10-CM

## 2020-07-30 DIAGNOSIS — F17210 Nicotine dependence, cigarettes, uncomplicated: Secondary | ICD-10-CM | POA: Diagnosis present

## 2020-07-30 DIAGNOSIS — Z888 Allergy status to other drugs, medicaments and biological substances status: Secondary | ICD-10-CM | POA: Diagnosis not present

## 2020-07-30 DIAGNOSIS — F319 Bipolar disorder, unspecified: Secondary | ICD-10-CM | POA: Diagnosis present

## 2020-07-30 DIAGNOSIS — S2242XA Multiple fractures of ribs, left side, initial encounter for closed fracture: Secondary | ICD-10-CM | POA: Diagnosis present

## 2020-07-30 DIAGNOSIS — Z79891 Long term (current) use of opiate analgesic: Secondary | ICD-10-CM | POA: Diagnosis not present

## 2020-07-30 DIAGNOSIS — W1839XA Other fall on same level, initial encounter: Secondary | ICD-10-CM | POA: Diagnosis present

## 2020-07-30 DIAGNOSIS — Z880 Allergy status to penicillin: Secondary | ICD-10-CM

## 2020-07-30 LAB — CBC WITH DIFFERENTIAL/PLATELET
Abs Immature Granulocytes: 0.03 10*3/uL (ref 0.00–0.07)
Basophils Absolute: 0.1 10*3/uL (ref 0.0–0.1)
Basophils Relative: 1 %
Eosinophils Absolute: 0.1 10*3/uL (ref 0.0–0.5)
Eosinophils Relative: 1 %
HCT: 38.8 % (ref 36.0–46.0)
Hemoglobin: 12.6 g/dL (ref 12.0–15.0)
Immature Granulocytes: 0 %
Lymphocytes Relative: 28 %
Lymphs Abs: 3 10*3/uL (ref 0.7–4.0)
MCH: 28.6 pg (ref 26.0–34.0)
MCHC: 32.5 g/dL (ref 30.0–36.0)
MCV: 88.2 fL (ref 80.0–100.0)
Monocytes Absolute: 1.1 10*3/uL — ABNORMAL HIGH (ref 0.1–1.0)
Monocytes Relative: 11 %
Neutro Abs: 6.5 10*3/uL (ref 1.7–7.7)
Neutrophils Relative %: 59 %
Platelets: 282 10*3/uL (ref 150–400)
RBC: 4.4 MIL/uL (ref 3.87–5.11)
RDW: 13.4 % (ref 11.5–15.5)
WBC: 10.8 10*3/uL — ABNORMAL HIGH (ref 4.0–10.5)
nRBC: 0 % (ref 0.0–0.2)

## 2020-07-30 LAB — I-STAT BETA HCG BLOOD, ED (MC, WL, AP ONLY): I-stat hCG, quantitative: <5 m[IU]/mL (ref <5)

## 2020-07-30 LAB — COMPREHENSIVE METABOLIC PANEL
ALT: 38 U/L (ref 0–44)
AST: 33 U/L (ref 15–41)
Albumin: 4.1 g/dL (ref 3.5–5.0)
Alkaline Phosphatase: 42 U/L (ref 38–126)
Anion gap: 8 (ref 5–15)
BUN: 15 mg/dL (ref 6–20)
CO2: 25 mmol/L (ref 22–32)
Calcium: 9.8 mg/dL (ref 8.9–10.3)
Chloride: 104 mmol/L (ref 98–111)
Creatinine, Ser: 0.68 mg/dL (ref 0.44–1.00)
GFR, Estimated: >60 mL/min (ref >60)
Glucose, Bld: 79 mg/dL (ref 70–99)
Potassium: 4 mmol/L (ref 3.5–5.1)
Sodium: 137 mmol/L (ref 135–145)
Total Bilirubin: 0.6 mg/dL (ref 0.3–1.2)
Total Protein: 7.1 g/dL (ref 6.5–8.1)

## 2020-07-30 LAB — RESP PANEL BY RT-PCR (FLU A&B, COVID) ARPGX2
Influenza A by PCR: NEGATIVE
Influenza B by PCR: NEGATIVE
SARS Coronavirus 2 by RT PCR: NEGATIVE

## 2020-07-30 MED ORDER — KETOROLAC TROMETHAMINE 15 MG/ML IJ SOLN
15.0000 mg | Freq: Three times a day (TID) | INTRAMUSCULAR | Status: DC
  Administered 2020-07-30 – 2020-08-01 (×5): 15 mg via INTRAVENOUS
  Filled 2020-07-30 (×7): qty 1

## 2020-07-30 MED ORDER — FENTANYL CITRATE (PF) 100 MCG/2ML IJ SOLN
50.0000 ug | Freq: Once | INTRAMUSCULAR | Status: AC
  Administered 2020-07-30: 50 ug via INTRAVENOUS
  Filled 2020-07-30: qty 2

## 2020-07-30 MED ORDER — ENOXAPARIN SODIUM 30 MG/0.3ML IJ SOSY
30.0000 mg | PREFILLED_SYRINGE | Freq: Two times a day (BID) | INTRAMUSCULAR | Status: DC
  Administered 2020-07-31 – 2020-08-01 (×3): 30 mg via SUBCUTANEOUS
  Filled 2020-07-30 (×4): qty 0.3

## 2020-07-30 MED ORDER — SIMETHICONE 80 MG PO CHEW
80.0000 mg | CHEWABLE_TABLET | Freq: Four times a day (QID) | ORAL | Status: DC | PRN
  Filled 2020-07-30: qty 1

## 2020-07-30 MED ORDER — DOCUSATE SODIUM 100 MG PO CAPS
100.0000 mg | ORAL_CAPSULE | Freq: Two times a day (BID) | ORAL | Status: DC
  Administered 2020-07-31 – 2020-08-01 (×3): 100 mg via ORAL
  Filled 2020-07-30 (×3): qty 1

## 2020-07-30 MED ORDER — OXYCODONE HCL 5 MG PO TABS
5.0000 mg | ORAL_TABLET | ORAL | Status: DC | PRN
  Administered 2020-07-31: 5 mg via ORAL
  Filled 2020-07-30: qty 1

## 2020-07-30 MED ORDER — ONDANSETRON HCL 4 MG/2ML IJ SOLN
4.0000 mg | Freq: Four times a day (QID) | INTRAMUSCULAR | Status: DC | PRN
  Administered 2020-07-31: 4 mg via INTRAVENOUS
  Filled 2020-07-30: qty 2

## 2020-07-30 MED ORDER — HYDROMORPHONE HCL 1 MG/ML IJ SOLN
1.0000 mg | INTRAMUSCULAR | Status: DC | PRN
  Administered 2020-07-30 – 2020-07-31 (×3): 1 mg via INTRAVENOUS
  Filled 2020-07-30 (×3): qty 1

## 2020-07-30 MED ORDER — GABAPENTIN 600 MG PO TABS
300.0000 mg | ORAL_TABLET | Freq: Three times a day (TID) | ORAL | Status: DC
  Administered 2020-07-30 – 2020-07-31 (×2): 300 mg via ORAL
  Filled 2020-07-30 (×4): qty 0.5
  Filled 2020-07-30: qty 1

## 2020-07-30 MED ORDER — LIDOCAINE 5 % EX PTCH
1.0000 | MEDICATED_PATCH | CUTANEOUS | Status: DC
  Administered 2020-07-30 – 2020-07-31 (×2): 1 via TRANSDERMAL
  Filled 2020-07-30 (×3): qty 1

## 2020-07-30 MED ORDER — IOHEXOL 300 MG/ML  SOLN
75.0000 mL | Freq: Once | INTRAMUSCULAR | Status: AC | PRN
  Administered 2020-07-30: 75 mL via INTRAVENOUS

## 2020-07-30 MED ORDER — METHOCARBAMOL 1000 MG/10ML IJ SOLN
500.0000 mg | Freq: Four times a day (QID) | INTRAVENOUS | Status: DC | PRN
  Filled 2020-07-30 (×3): qty 5

## 2020-07-30 MED ORDER — METOCLOPRAMIDE HCL 5 MG/ML IJ SOLN
10.0000 mg | Freq: Four times a day (QID) | INTRAMUSCULAR | Status: DC
  Administered 2020-07-30 – 2020-08-01 (×6): 10 mg via INTRAVENOUS
  Filled 2020-07-30 (×7): qty 2

## 2020-07-30 MED ORDER — ACETAMINOPHEN 325 MG PO TABS
650.0000 mg | ORAL_TABLET | Freq: Four times a day (QID) | ORAL | Status: DC
  Administered 2020-07-30 – 2020-07-31 (×3): 650 mg via ORAL
  Filled 2020-07-30 (×3): qty 2

## 2020-07-30 MED ORDER — OXYCODONE HCL 5 MG PO TABS
10.0000 mg | ORAL_TABLET | ORAL | Status: DC | PRN
  Administered 2020-07-31 – 2020-08-01 (×2): 10 mg via ORAL
  Filled 2020-07-30 (×2): qty 2

## 2020-07-30 MED ORDER — NICOTINE 14 MG/24HR TD PT24
14.0000 mg | MEDICATED_PATCH | Freq: Every day | TRANSDERMAL | Status: DC
  Administered 2020-07-31 – 2020-08-01 (×2): 14 mg via TRANSDERMAL
  Filled 2020-07-30 (×2): qty 1
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
## 2020-07-30 NOTE — ED Provider Notes (Signed)
[facility] Provider Note   CSN: [id number] Arrival date & time: 07/30/20  1603     History Chief Complaint  Patient presents with   Rib Fracture    Jordan Hanson is a 25 y.o. female with past medical history of bipolar, depression that presents to the ER today for rib injury.  Patient states that yesterday she was horsing around with her children, was trying to do the " superman" and states that she flung herself from the head of the bed to the end of the bed in the air.  Patient states that her ribs hit the post.  Patient states that she did not hit her head, no LOC, no other injuries.  This occurred yesterday.  States that at that time she [facility] who [facility]ed her ribs and noticed multiple broken ribs, unable to see this x-ray.  Denies any chest pain on the anterior side, primarily posterior.  Denies any abdominal pain, nausea, vomiting, neck pain, numbness.  Denies any chest pain.  Patient states that she is in normal for this, denies any prior injuries to this area.  States that she took tramadol, hot this has not been helping her.  States that pain is severe.  States that she feels short of breath.  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     Family History  Adopted: Yes    Social History   Tobacco Use   Smoking status: Every Day    Packs/day: 1.00    Years: 6.00    Pack years: 6.00    Types: Cigarettes   Smokeless tobacco: Never  Vaping Use   Vaping Use: Never used  Substance Use Topics   Alcohol use: Yes    Alcohol/week: 14.0 standard drinks    Types: 14 Cans of beer per week   Drug use: Yes    Types: Marijuana    Comment: 7.0 grams per week    Home Medications Prior to Admission medications   Medication Sig Start Date End Date Taking? Authorizing Provider  APAP-Pamabrom-Pyrilamine 500-25-15 MG TABS Take 1 tablet by mouth daily as needed (menstrual cramps). Midol Complete Caffeine Free   Yes [provider]  meloxicam (MOBIC) 15 MG tablet Take 15 mg by mouth daily.   Yes [provider]  Multiple Vitamin (MULTIVITAMIN WITH MINERALS) TABS tablet Take 1 tablet by mouth daily with supper.   Yes [provider]  traMADol-acetaminophen (ULTRACET) 37.5-325 MG tablet Take 1 tablet by mouth 4 (four) times daily as needed (pain).   Yes [provider]  benzonatate (TESSALON PERLES) 100 MG capsule Take 2 capsules (200 mg total) by mouth 3 (three) times  daily as needed for cough. Patient not taking: Reported on 07/30/2020 01/22/20   Alver Sorrow, NP  cetirizine (ZYRTEC ALLERGY) 10 MG tablet Take 1 tablet (10 mg total) by mouth daily. Patient not taking: Reported on 07/30/2020 12/17/19   Particia Nearing, PA-C  cyclobenzaprine (FLEXERIL) 10 MG tablet Take 1 tablet (10 mg total) by mouth 3 (three) times daily as needed for muscle spasms. DO NOT DRINK ALCOHOL OR DRIVE WHILE TAKING THIS MEDICATION Patient not taking: Reported on 07/30/2020 03/20/20   Particia Nearing, PA-C  fluticasone [facility]) 50 MCG/ACT nasal spray Place 1 spray into both nostrils daily. Patient not taking: Reported on 07/30/2020 12/17/19   Particia Nearing, PA-C  loperamide (IMODIUM) 2 MG capsule Take 1 capsule (2 mg total) by mouth 4 (four) times daily as needed for diarrhea or loose stools. Patient not taking: Reported on 07/30/2020 01/22/20   Alver Sorrow, NP  ondansetron (ZOFRAN) 4 MG tablet Take 1 tablet (4 mg total) by mouth every 8 (eight) hours as needed for nausea or vomiting. Patient not taking: Reported on 07/30/2020 01/22/20   Alver Sorrow, NP    Allergies    Geodon [ziprasidone hcl], Penicillins, and Tamsulosin  Review of Systems   Review of Systems  Constitutional:  Negative for chills, diaphoresis, fatigue and fever.  HENT:  Negative for congestion, sore throat and trouble swallowing.   Eyes:  Negative for pain and visual disturbance.  Respiratory:  Positive for shortness of breath. Negative for cough and wheezing.   Cardiovascular:  Negative for chest pain, palpitations and leg swelling.  Gastrointestinal:  Negative for abdominal distention, abdominal pain, diarrhea, nausea and vomiting.  Genitourinary:  Negative for difficulty urinating.  Musculoskeletal:  Positive for arthralgias. Negative for back pain, neck pain and neck stiffness.  Skin:  Negative for pallor.  Neurological:  Negative for dizziness, speech difficulty, weakness and headaches.  Psychiatric/Behavioral:  Negative for confusion.    Physical Exam Updated Vital Signs BP 104/73   Pulse 78   Temp 98.1 F (36.7 C) (Oral)   Resp 16   SpO2 98%   Physical Exam Constitutional:      General: She is not in acute distress.    Appearance: Normal appearance. She is not ill-appearing, toxic-appearing or diaphoretic.  HENT:     Mouth/Throat:     Mouth: Mucous membranes are moist.     Pharynx: Oropharynx is clear.  Eyes:     General: No scleral icterus.    Extraocular Movements: Extraocular movements intact.     Pupils: Pupils are equal, round, and reactive to light.  Cardiovascular:     Rate and Rhythm: Normal rate and regular rhythm.      Pulses: Normal pulses.     Heart sounds: Normal heart sounds.  Pulmonary:     Effort: Pulmonary effort is normal. No respiratory distress.     Breath sounds: Normal breath sounds. No stridor. No wheezing, rhonchi or rales.  Chest:     Chest wall: No tenderness.  Abdominal:     General: Abdomen is flat. There is no distension.     Palpations: Abdomen is soft.     Tenderness: There is no abdominal tenderness. There is no guarding or rebound.  Musculoskeletal:        General: No swelling or tenderness. Normal range of motion.     Cervical back: Normal range of motion and neck supple. No rigidity.       Back:  Right lower leg: No edema.     Left lower leg: No edema.     Comments: Patient with posterior left rib pain.  No flail chest.  No ecchymosis.  No midline tenderness to back.  Skin:    General: Skin is warm and dry.     Capillary Refill: Capillary refill takes less than 2 seconds.     Coloration: Skin is not pale.  Neurological:     General: No focal deficit present.     Mental Status: She is alert and oriented to person, place, and time.     Comments: Alert. Clear speech. No facial droop. CNIII-XII grossly intact. Bilateral upper and lower extremities' sensation grossly intact. 5/5 symmetric strength with grip strength and with plantar and dorsi flexion bilaterally. Patellar DTRs are 2+ and symmetric . Normal finger to nose bilaterally. Negative pronator drift. Negative Romberg sign. Gait is steady and intact.    Psychiatric:        Mood and Affect: Mood normal.        Behavior: Behavior normal.    ED Results / Procedures / Treatments   Labs (all labs ordered are listed, but only abnormal results are displayed) Labs Reviewed  CBC WITH DIFFERENTIAL/PLATELET - Abnormal; Notable for the following components:      Result Value   WBC 10.8 (*)    Monocytes Absolute 1.1 (*)    All other components within normal limits  RESP PANEL BY RT-PCR (FLU A&B, COVID) [id number]   COMPREHENSIVE METABOLIC PANEL  HIV ANTIBODY (ROUTINE TESTING W REFLEX)  CBC  CREATININE, SERUM  I-STAT BETA HCG BLOOD, ED ([facility], [facility], [facility] ONLY)    EKG None  Radiology CT Chest W Contrast  Result Date: 07/30/2020 CLINICAL DATA:  25 year old female with chest wall pain. EXAM: CT CHEST WITH CONTRAST TECHNIQUE: Multidetector CT imaging of the chest was performed during intravenous contrast administration. CONTRAST:  41mL OMNIPAQUE IOHEXOL 300 MG/ML  SOLN COMPARISON:  Chest radiograph dated 03/20/2020. FINDINGS: Cardiovascular: There is no cardiomegaly or pericardial effusion. The thoracic aorta is unremarkable. The origins of the great vessels of the aortic arch appear patent. The central pulmonary arteries are grossly unremarkable for the degree of opacification. Mediastinum/Nodes: No hilar or mediastinal adenopathy. The esophagus is grossly unremarkable. No mediastinal fluid collection. Lungs/Pleura: There is a small to moderate left pneumothorax, likely 15-20%. Linear left lung base atelectasis. Trace left pleural effusion. The right lung is clear. The central airways are patent. Upper Abdomen: No acute abnormality. Musculoskeletal: There is a nondisplaced fracture of the posterior left tenth rib and displaced fracture of the posterior left eleventh rib. IMPRESSION: 1. Small to moderate left pneumothorax, likely 15-20%. 2. Nondisplaced fractures of the posterior left tenth and eleventh ribs. These results were called by telephone at the time of interpretation on 07/30/2020 at 8:33 pm to provider [facility] , who verbally acknowledged these results. Electronically Signed   By: Elgie Collard M.D.   On: 07/30/2020 20:36   DG Chest Portable 1 View  Result Date: 07/30/2020 CLINICAL DATA:  Pneumothorax. EXAM: PORTABLE CHEST 1 VIEW COMPARISON:  Current chest CT.  Prior chest radiographs. FINDINGS: Left sided pneumothorax, 15-20% in size, as noted on the current CT. Small area of opacity at the left  lung base is consistent with atelectasis, also as noted on the current chest CT. Remainder of the lungs is clear. No convincing pleural effusions. No right pneumothorax. Heart, mediastinum and hila are unremarkable. Inferior left rib fractures noted on CT are not  visualized on the single frontal chest radiograph. Skeletal structures are unremarkable. IMPRESSION: 1. Left pneumothorax, 15-20% size. This is stable in appearance compared to the current chest CT. Electronically Signed   By: Amie Portland M.D.   On: 07/30/2020 22:18    Procedures Procedures   Medications Ordered in ED Medications  enoxaparin (LOVENOX) injection 30 mg (has no administration in time range)  acetaminophen (TYLENOL) tablet 650 mg (has no administration in time range)  ketorolac (TORADOL) 15 MG/ML injection 15 mg (has no administration in time range)  gabapentin (NEURONTIN) tablet 300 mg (has no administration in time range)  oxyCODONE (Oxy IR/ROXICODONE) immediate release tablet 5 mg (has no administration in time range)  oxyCODONE (Oxy IR/ROXICODONE) immediate release tablet 10 mg (has no administration in time range)  HYDROmorphone (DILAUDID) injection 1 mg (has no administration in time range)  methocarbamol (ROBAXIN) 500 mg in dextrose 5 % 50 mL IVPB (has no administration in time range)  lidocaine (LIDODERM) 5 % 1 patch (has no administration in time range)  metoCLOPramide (REGLAN) injection 10 mg (has no administration in time range)  ondansetron (ZOFRAN) injection 4 mg (has no administration in time range)  simethicone (MYLICON) chewable tablet 80 mg (has no administration in time range)  docusate sodium (COLACE) capsule 100 mg (has no administration in time range)  iohexol (OMNIPAQUE) 300 MG/ML solution 75 mL (75 mLs Intravenous Contrast Given 07/30/20 2027)  fentaNYL (SUBLIMAZE) injection 50 mcg (50 mcg Intravenous Given 07/30/20 2200)    ED Course  I have reviewed the triage vital signs and the nursing  notes.  Pertinent labs & imaging results that were available during my care of the patient were reviewed by me and considered in my medical decision making (see chart for details).    MDM Rules/Calculators/A&P                          Patient presents to the emerge department today after traumatic injury to her ribs.  CT does show small pneumothorax 15 to 20% with rib fractures on the left side.  Patient is in severe pain, will give fentanyl.  No flail chest, satting at 100% on room air. Consulted trauma surgery, Dr.Stechschulte who will admit patient. Will hold off on chest tube at this time.  The patient appears reasonably stabilized for admission considering the current resources, flow, and capabilities available in the ED at this time, and I doubt any other [facility] requiring further screening and/or treatment in the ED prior to admission.   Final Clinical Impression(s) / ED Diagnoses Final diagnoses:  Closed fracture of multiple ribs of left side, initial encounter  Traumatic pneumothorax, initial encounter    Rx / DC Orders ED Discharge Orders     None        Farrel Gordon, PA-C 07/30/20 2324    Tilden Fossa, MD 07/31/20 1108

## 2020-07-30 NOTE — H&P (Signed)
Admitting Physician: Jordan Hanson  Service: Trauma Surgery  CC: Chest pain  Subjective   Mechanism of Injury: MOE Jordan Hanson is an 25 y.o. female who presented as a level 2 trauma after a a fall.  About 12 hours prior to admission she was roughhousing with her kids in her bed when she fell onto the footboard post hitting her left lower back.  She had significant pain and went to her primary care doctor's office and got a chest x-ray.  The results were concerning for pneumothorax so she was referred to the emergency department.  She has significant pain on her left chest and some shortness of breath, however she has been satting 100% in the emergency department.  Past Medical History:  Diagnosis Date   ADHD (attention deficit hyperactivity disorder)    Anxiety    Bipolar disorder (HCC)    Depression     Past Surgical History:  Procedure Laterality Date   SMALL INTESTINE SURGERY     TONSILLECTOMY      Family History  Adopted: Yes    Social:  reports that she has been smoking cigarettes. She has a 6.00 pack-year smoking history. She has never used smokeless tobacco. She reports current alcohol use of about 14.0 standard drinks of alcohol per week. She reports current drug use. Drug: Marijuana.  Allergies:  Allergies  Allergen Reactions   Geodon [Ziprasidone Hcl]    Penicillins Swelling and Rash    Has patient had a PCN reaction causing immediate rash, facial/tongue/throat swelling, SOB or lightheadedness with hypotension:YES Has patient had a PCN reaction causing severe rash involving mucus membranes or skin necrosis: NO Has patient had a PCN reaction that required hospitalization NO Has patient had a PCN reaction occurring within the last 10 years: NO If all of the above answers are "NO", then may proceed with Cephalosporin use.    Tamsulosin Rash    Medications: Current Outpatient Medications  Medication Instructions   benzonatate (TESSALON PERLES) 200 mg,  Oral, 3 times daily PRN   cetirizine (ZYRTEC ALLERGY) 10 mg, Oral, Daily   cyclobenzaprine (FLEXERIL) 10 mg, Oral, 3 times daily PRN, DO NOT DRINK ALCOHOL OR DRIVE WHILE TAKING THIS MEDICATION   fluticasone (FLONASE) 50 MCG/ACT nasal spray 1 spray, Each Nare, Daily   hydrOXYzine (ATARAX/VISTARIL) 25 mg, Oral, At bedtime PRN   loperamide (IMODIUM) 2 mg, Oral, 4 times daily PRN   ondansetron (ZOFRAN) 4 mg, Oral, Every 8 hours PRN    Objective   Primary Survey: Blood pressure 104/73, pulse 78, temperature 98.1 F (36.7 C), temperature source Oral, resp. rate 16, SpO2 98 %. Airway: Patent, protecting airway Breathing: Bilateral breath sounds, breathing spontaneously Circulation: Stable, Palpable peripheral pulses Disability: Moving all extremities, GCS 15 Environment/Exposure: Warm, dry  Primary Survey Adjuncts:  CXR - See results below  Secondary Survey: Head: Normocephalic, atraumatic Neck: Full range of motion without pain, no midline tenderness Chest: Bilateral breath sounds, chest wall stable Abdomen:  Left posterior flank ecchymosis, tenderness over left posterior chest wall. Upper Extremities: Strength and sensation intact, palpable peripheral pulses Lower extremities: Strength and sensation intact, palpable peripheral pulses Back: No step offs or deformities, atraumatic Rectal: Deferred Psych: Normal mood and affect  Results for orders placed or performed during the hospital encounter of 07/30/20 (from the past 24 hour(s))  Comprehensive metabolic panel     Status: None   Collection Time: 07/30/20  5:12 PM  Result Value Ref Range   Sodium 137 135 - 145  mmol/L   Potassium 4.0 3.5 - 5.1 mmol/L   Chloride 104 98 - 111 mmol/L   CO2 25 22 - 32 mmol/L   Glucose, Bld 79 70 - 99 mg/dL   BUN 15 6 - 20 mg/dL   Creatinine, Ser 0.17 0.44 - 1.00 mg/dL   Calcium 9.8 8.9 - 51.0 mg/dL   Total Protein 7.1 6.5 - 8.1 g/dL   Albumin 4.1 3.5 - 5.0 g/dL   AST 33 15 - 41 U/L   ALT 38 0  - 44 U/L   Alkaline Phosphatase 42 38 - 126 U/L   Total Bilirubin 0.6 0.3 - 1.2 mg/dL   GFR, Estimated >25 >85 mL/min   Anion gap 8 5 - 15  CBC with Differential     Status: Abnormal   Collection Time: 07/30/20  5:12 PM  Result Value Ref Range   WBC 10.8 (H) 4.0 - 10.5 K/uL   RBC 4.40 3.87 - 5.11 MIL/uL   Hemoglobin 12.6 12.0 - 15.0 g/dL   HCT 27.7 82.4 - 23.5 %   MCV 88.2 80.0 - 100.0 fL   MCH 28.6 26.0 - 34.0 pg   MCHC 32.5 30.0 - 36.0 g/dL   RDW 36.1 44.3 - 15.4 %   Platelets 282 150 - 400 K/uL   nRBC 0.0 0.0 - 0.2 %   Neutrophils Relative % 59 %   Neutro Abs 6.5 1.7 - 7.7 K/uL   Lymphocytes Relative 28 %   Lymphs Abs 3.0 0.7 - 4.0 K/uL   Monocytes Relative 11 %   Monocytes Absolute 1.1 (H) 0.1 - 1.0 K/uL   Eosinophils Relative 1 %   Eosinophils Absolute 0.1 0.0 - 0.5 K/uL   Basophils Relative 1 %   Basophils Absolute 0.1 0.0 - 0.1 K/uL   Immature Granulocytes 0 %   Abs Immature Granulocytes 0.03 0.00 - 0.07 K/uL  I-Stat beta hCG blood, ED     Status: None   Collection Time: 07/30/20  5:31 PM  Result Value Ref Range   I-stat hCG, quantitative <5.0 <5 mIU/mL   Comment 3             Imaging Orders  CT Chest W Contrast  DG Chest Portable 1 View  DG Chest Port 1 View    Assessment and Plan   Jordan Hanson is an 25 y.o. female who presented as a level 2 trauma after a a fall.  Injuries: Left-sided pneumothorax -recheck chest x-ray in the morning, attempt to hold off on chest tube at this time, incentive spirometer, pulse oximetry Left posterior 10th and 11th rib fracture -pain control  Consults:  None  FEN -regular diet VTE - Lovenox and Sequential Compression Devices ID - None given in the trauma bay.  Dispo - Med-Surg Floor    Quentin Ore, MD  [facility] Use [email or web address] to contact on call provider

## 2020-07-30 NOTE — ED Notes (Signed)
Paged trauma DR

## 2020-07-30 NOTE — ED Triage Notes (Signed)
Pt arrives to ED after being told at [facility] that she has multiple broken ribs on the L side. Pt jumped over the bed yesterday and landed on her L side on the bed post.

## 2020-07-30 NOTE — ED Provider Notes (Signed)
Emergency Medicine Provider Triage Evaluation Note  Jordan Hanson , a 25 y.o. female  was evaluated in triage.  Pt complains of left rib pain.  Patient states that she was playing with her children and jumped in the air and landed on the foot of her bed with her left ribs striking the wood frame of her bed.  This occurred yesterday.  She states she was having exquisite pain in the region so she went to [facility] who x-rayed the left ribs and noted multiple broken ribs on the left side.  Denies any anterior chest, abdomen, head or neck.  No LOC, numbness.  Physical Exam  BP 118/80   Pulse 90   Temp 98.1 F (36.7 C) (Oral)   Resp 16   SpO2 100%  Gen:   Awake, no distress   Resp:  Normal effort  MSK:   Moves extremities without difficulty  Other:  Exquisite tenderness to the left lateral ribs.  No overlying skin changes.  Difficult to assess due to patient's pain.  Poor inspiratory effort.  Medical Decision Making  Medically screening exam initiated at 4:59 PM.  Appropriate orders placed.  Jordan Hanson was informed that the remainder of the evaluation will be completed by another provider, this initial triage assessment does not replace that evaluation, and the importance of remaining in the ED until their evaluation is complete.   [name], PA-C 07/30/20 1702    Cheryll Cockayne, MD 07/31/20 1109

## 2020-07-31 ENCOUNTER — Inpatient Hospital Stay (HOSPITAL_COMMUNITY): Payer: Medicaid Other

## 2020-07-31 LAB — CBC
HCT: 37.9 % (ref 36.0–46.0)
Hemoglobin: 12.1 g/dL (ref 12.0–15.0)
MCH: 28.9 pg (ref 26.0–34.0)
MCHC: 31.9 g/dL (ref 30.0–36.0)
MCV: 90.5 fL (ref 80.0–100.0)
Platelets: 230 10*3/uL (ref 150–400)
RBC: 4.19 MIL/uL (ref 3.87–5.11)
RDW: 13.7 % (ref 11.5–15.5)
WBC: 6.9 10*3/uL (ref 4.0–10.5)
nRBC: 0 % (ref 0.0–0.2)

## 2020-07-31 LAB — CREATININE, SERUM
Creatinine, Ser: 0.63 mg/dL (ref 0.44–1.00)
GFR, Estimated: >60 mL/min (ref >60)

## 2020-07-31 LAB — HIV ANTIBODY (ROUTINE TESTING W REFLEX): HIV Screen 4th Generation wRfx: NONREACTIVE

## 2020-07-31 MED ORDER — METHOCARBAMOL 750 MG PO TABS
750.0000 mg | ORAL_TABLET | Freq: Three times a day (TID) | ORAL | Status: DC
  Administered 2020-07-31 – 2020-08-01 (×4): 750 mg via ORAL
  Filled 2020-07-31 (×4): qty 1

## 2020-07-31 MED ORDER — ACETAMINOPHEN 500 MG PO TABS
1000.0000 mg | ORAL_TABLET | Freq: Four times a day (QID) | ORAL | Status: DC
  Administered 2020-07-31 – 2020-08-01 (×4): 1000 mg via ORAL
  Filled 2020-07-31 (×5): qty 2

## 2020-07-31 MED ORDER — GABAPENTIN 300 MG PO CAPS
300.0000 mg | ORAL_CAPSULE | Freq: Three times a day (TID) | ORAL | Status: DC
  Administered 2020-07-31 – 2020-08-01 (×4): 300 mg via ORAL
  Filled 2020-07-31 (×4): qty 1

## 2020-07-31 NOTE — Progress Notes (Signed)
    CC: Fall.  Subjective: Patient sitting in a dark corner of the emergency department still awaiting a bed.  She knows she has significant discomfort from her ribs and she can feel them moving.  She thought she might be short of breath but she really just has trouble breathing deeply because of the discomfort.  Objective: Vital signs in last 24 hours: Temp:  [98.1 F (36.7 C)] 98.1 F (36.7 C) (07/23 1653) Pulse Rate:  [53-90] 61 (07/24 0800) Resp:  [15-18] 15 (07/24 0800) BP: (96-118)/(47-80) 112/68 (07/24 0800) SpO2:  [98 %-100 %] 100 % (07/24 0800) No intake or output recorded.  Still in the ED Afebrile vital signs are stable. CBC is stable WBC 10.8>>6.9 H/H 12.6/38.8>> 12.1/37.9 Platelets 230,000 O2 sats room air or 100% with a heart rate of 69. Intake/Output from previous day: No intake/output data recorded. Intake/Output this shift: No intake/output data recorded.  General appearance: alert, cooperative, no distress, and rather anxious and tearful. Resp: She is clear to auscultation but extremely tender left back GI: Abdomen soft nontender she has not eaten very much, and was not aware she can order a tray.  Lab Results:  Recent Labs    07/30/20 1712 07/31/20 0630  WBC 10.8* 6.9  HGB 12.6 12.1  HCT 38.8 37.9  PLT 282 230    BMET Recent Labs    07/30/20 1712 07/31/20 0630  NA 137  --   K 4.0  --   CL 104  --   CO2 25  --   GLUCOSE 79  --   BUN 15  --   CREATININE 0.68 0.63  CALCIUM 9.8  --    PT/INR No results for input(s): LABPROT, INR in the last 72 hours.  Recent Labs  Lab 07/30/20 1712  AST 33  ALT 38  ALKPHOS 42  BILITOT 0.6  PROT 7.1  ALBUMIN 4.1     Lipase     Component Value Date/Time   LIPASE 25 04/21/2019 1059     Medications:  acetaminophen  650 mg Oral Q6H   docusate sodium  100 mg Oral BID   enoxaparin (LOVENOX) injection  30 mg Subcutaneous Q12H   gabapentin  300 mg Oral TID   ketorolac  15 mg Intravenous Q8H    lidocaine  1 patch Transdermal Q24H   metoCLOPramide (REGLAN) injection  10 mg Intravenous Q6H   nicotine  14 mg Transdermal Daily    methocarbamol (ROBAXIN) IV        Assessment/Plan   Fall with left chest pain Fx left posterior 10th/11th rib Left 15-20% Left PTX   - CXR this AM: apical portion appears slightly decreased basilar portion more apparent -no substantial change  FEN: Regular diet ID: None DVT: Lovenox Pain: She has had 3 doses of Tylenol 650 mg, Neurontin 300 mg x 2, Dilaudid 1 mg x 2, Toradol 15 mg x 2, Lidoderm patch, NicoDerm patch, oxycodone 5 mg x 1  Hx bipolar disorder/depression Hx ADHD  -  Plan: Continue multimodal pain control. Place her on O2 @ 2 l/Holliday. We will repeat chest x-ray in the a.m.     LOS: 1 day    Jordan Hanson 07/31/2020 Please see Amion

## 2020-08-01 ENCOUNTER — Encounter (HOSPITAL_COMMUNITY): Payer: Self-pay

## 2020-08-01 ENCOUNTER — Inpatient Hospital Stay (HOSPITAL_COMMUNITY): Payer: Medicaid Other

## 2020-08-01 MED ORDER — HYDROXYZINE HCL 25 MG PO TABS: 25.0000 mg | ORAL_TABLET | Freq: Three times a day (TID) | ORAL | Status: DC | PRN

## 2020-08-01 MED ORDER — GABAPENTIN 300 MG PO CAPS: 300.0000 mg | ORAL_CAPSULE | Freq: Three times a day (TID) | ORAL | 0 refills | Status: DC

## 2020-08-01 MED ORDER — ACETAMINOPHEN 500 MG PO TABS: 1000.0000 mg | ORAL_TABLET | Freq: Four times a day (QID) | ORAL | 0 refills | Status: DC | PRN

## 2020-08-01 MED ORDER — TRAMADOL HCL 50 MG PO TABS: 50.0000 mg | ORAL_TABLET | Freq: Four times a day (QID) | ORAL | Status: DC | PRN

## 2020-08-01 MED ORDER — LIDOCAINE 5 % EX PTCH: 1.0000 | MEDICATED_PATCH | CUTANEOUS | 0 refills | Status: DC

## 2020-08-01 MED ORDER — IBUPROFEN 800 MG PO TABS: 800.0000 mg | ORAL_TABLET | Freq: Three times a day (TID) | ORAL | 0 refills | Status: DC

## 2020-08-01 MED ORDER — METHOCARBAMOL 750 MG PO TABS: 750.0000 mg | ORAL_TABLET | Freq: Three times a day (TID) | ORAL | 0 refills | Status: DC

## 2020-08-01 MED ORDER — IBUPROFEN 800 MG PO TABS
800.0000 mg | ORAL_TABLET | Freq: Three times a day (TID) | ORAL | Status: DC
  Administered 2020-08-01: 800 mg via ORAL
  Filled 2020-08-01: qty 1

## 2020-08-01 NOTE — Progress Notes (Signed)
Progress Note     Subjective: Patient reports pain and anxiety this AM. Pain in L back where ribs are broken and anxiety over being in pain and feeling stuck here. We discussed expectations regarding pain from rib fractures. She denies SOB, and has been using IS. Was able to pull IS to 1500 yesterday. She has been getting out of bed. She reports she does not usually take mobic or ultracet at home, those were prescribed by [facility] after initial workup for fall.   Objective: Vital signs in last 24 hours: Temp:  [97.7 F (36.5 C)-98.3 F (36.8 C)] 98.2 F (36.8 C) (07/25 1115) Pulse Rate:  [61-82] 82 (07/25 1115) Resp:  [14-19] 19 (07/25 1115) BP: (99-134)/(54-87) 134/75 (07/25 1115) SpO2:  [99 %-100 %] 99 % (07/25 1115)    Intake/Output from previous day: 07/24 0701 - 07/25 0700 In: 240 [P.O.:240] Out: -  Intake/Output this shift: No intake/output data recorded.  PE: General: pleasant, WD, WN female who is laying in bed in NAD Heart: regular, rate, and rhythm.  Normal s1,s2. No obvious murmurs, gallops, or rubs noted.   Lungs: CTAB, no wheezes, rhonchi, or rales noted.  Respiratory effort nonlabored Abd: soft, NT, ND MS: all 4 extremities are symmetrical with no cyanosis, clubbing, or edema. Skin: warm and dry with no masses, lesions, or rashes Psych: A&Ox3 with an anxious affect.    Lab Results:  Recent Labs    07/30/20 1712 07/31/20 0630  WBC 10.8* 6.9  HGB 12.6 12.1  HCT 38.8 37.9  PLT 282 230   BMET Recent Labs    07/30/20 1712 07/31/20 0630  NA 137  --   K 4.0  --   CL 104  --   CO2 25  --   GLUCOSE 79  --   BUN 15  --   CREATININE 0.68 0.63  CALCIUM 9.8  --    PT/INR No results for input(s): LABPROT, INR in the last 72 hours. CMP     Component Value Date/Time   NA 137 07/30/2020 1712   NA 140 09/08/2019 0916   K 4.0 07/30/2020 1712   CL 104 07/30/2020 1712   CO2 25 07/30/2020 1712   GLUCOSE 79 07/30/2020 1712   BUN 15  07/30/2020 1712   BUN 9 09/08/2019 0916   CREATININE 0.63 07/31/2020 0630   CALCIUM 9.8 07/30/2020 1712   PROT 7.1 07/30/2020 1712   PROT 6.7 09/08/2019 0916   ALBUMIN 4.1 07/30/2020 1712   ALBUMIN 4.6 09/08/2019 0916   AST 33 07/30/2020 1712   ALT 38 07/30/2020 1712   ALKPHOS 42 07/30/2020 1712   BILITOT 0.6 07/30/2020 1712   BILITOT 0.4 09/08/2019 0916   GFRNONAA >60 07/31/2020 0630   GFRAA 147 09/08/2019 0916   Lipase     Component Value Date/Time   LIPASE 25 04/21/2019 1059       Studies/Results: CT Chest W Contrast  Result Date: 07/30/2020 CLINICAL DATA:  25 year old female with chest wall pain. EXAM: CT CHEST WITH CONTRAST TECHNIQUE: Multidetector CT imaging of the chest was performed during intravenous contrast administration. CONTRAST:  74mL OMNIPAQUE IOHEXOL 300 MG/ML  SOLN COMPARISON:  Chest radiograph dated 03/20/2020. FINDINGS: Cardiovascular: There is no cardiomegaly or pericardial effusion. The thoracic aorta is unremarkable. The origins of the great vessels of the aortic arch appear patent. The central pulmonary arteries are grossly unremarkable for the degree of opacification. Mediastinum/Nodes: No hilar or mediastinal adenopathy. The esophagus is grossly unremarkable. No  mediastinal fluid collection. Lungs/Pleura: There is a small to moderate left pneumothorax, likely 15-20%. Linear left lung base atelectasis. Trace left pleural effusion. The right lung is clear. The central airways are patent. Upper Abdomen: No acute abnormality. Musculoskeletal: There is a nondisplaced fracture of the posterior left tenth rib and displaced fracture of the posterior left eleventh rib. IMPRESSION: 1. Small to moderate left pneumothorax, likely 15-20%. 2. Nondisplaced fractures of the posterior left tenth and eleventh ribs. These results were called by telephone at the time of interpretation on 07/30/2020 at 8:33 pm to provider [facility] , who verbally acknowledged these results.  Electronically Signed   By: Elgie Collard M.D.   On: 07/30/2020 20:36   DG CHEST PORT 1 VIEW  Result Date: 08/01/2020 CLINICAL DATA:  Left pneumothorax EXAM: PORTABLE CHEST 1 VIEW COMPARISON:  07/31/2020 FINDINGS: Persistent left pneumothorax without substantial change. Lungs are clear. No pleural effusion. Normal heart size. IMPRESSION: No substantial change in left pneumothorax. Electronically Signed   By: Guadlupe Spanish M.D.   On: 08/01/2020 07:51   DG Chest Port 1 View  Result Date: 07/31/2020 CLINICAL DATA:  Left pneumothorax. EXAM: PORTABLE CHEST 1 VIEW COMPARISON:  07/30/2020 FINDINGS: Right lung clear. Apical component of left pneumothorax appears slightly decreased in the interval although the basilar component is more apparent on today's study. Minimal atelectasis noted left base. The cardiopericardial silhouette is within normal limits for size. Left tenth and eleventh rib fracture seen on recent CT are not readily evident by x-ray. Telemetry leads overlie the chest. IMPRESSION: No substantial change in left sided pneumothorax. Electronically Signed   By: Kennith Center M.D.   On: 07/31/2020 07:17   DG Chest Portable 1 View  Result Date: 07/30/2020 CLINICAL DATA:  Pneumothorax. EXAM: PORTABLE CHEST 1 VIEW COMPARISON:  Current chest CT.  Prior chest radiographs. FINDINGS: Left sided pneumothorax, 15-20% in size, as noted on the current CT. Small area of opacity at the left lung base is consistent with atelectasis, also as noted on the current chest CT. Remainder of the lungs is clear. No convincing pleural effusions. No right pneumothorax. Heart, mediastinum and hila are unremarkable. Inferior left rib fractures noted on CT are not visualized on the single frontal chest radiograph. Skeletal structures are unremarkable. IMPRESSION: 1. Left pneumothorax, 15-20% size. This is stable in appearance compared to the current chest CT. Electronically Signed   By: Amie Portland M.D.   On: 07/30/2020  22:18    Anti-infectives: Anti-infectives (From admission, onward)    None        Assessment/Plan Fall  L 10-11th rib fractures - multimodal pain control, adjusted meds this AM, IS,  L PTX - CXR this AM with persistent PTX but stable, O2 saturation 100% on room air and pt pulling 1250 -1500 on IS Anxiety - atarax prn   FEN: reg diet  VTE: LMWH ID: no abx indicated at this time  Dispo: Work on pain control and anxiety, likely DC this afternoon if improved  LOS: 2 days    Juliet Rude, [facility] Surgery 08/01/2020, 11:28 AM Please see Amion for pager number during day hours 7:00am-4:30pm

## 2020-08-01 NOTE — Discharge Instructions (Signed)
Recommend contacting your insurance company either online or by phone and finding out which primary care providers are in network for you and nearby. You can research from that list of providers and choose whoever you would like to establish with for primary care.

## 2020-08-01 NOTE — Discharge Summary (Signed)
Physician Discharge Summary  Patient ID: Jordan Hanson MRN: [id number] DOB/AGE: 01-11-1995 25 y.o.  Admit date: 07/30/2020 Discharge date: 08/01/2020  Discharge Diagnoses Fall Pneumothorax on left [J93.9] Traumatic pneumothorax, initial encounter [S27.0XXA] Closed fracture of multiple ribs of left side, initial encounter [S22.42XA] Generalized anxiety disorder  Consultants none  Procedures none  HPI: Jordan Hanson is an 25 y.o. female who presented as a level 2 trauma after a a fall.  About 12 hours prior to admission she was roughhousing with her kids in her bed when she fell onto the footboard post hitting her left lower back.  She had significant pain and went to her primary care doctor's office and got a chest x-ray.  The results were concerning for pneumothorax so she was referred to the emergency department.  She has significant pain on her left chest and some shortness of breath, however she has been satting 100% in the emergency department.  Hospital Course:  Patient was admitted to the trauma service for further evaluation and management. A follow up chest xray showed stable pneumothorax, O2 saturation 100% on room air, and with good performance with incentive spirometry (pulling 1250 -1500). She had some pain and anxiety related to pain and hospital stay in addition to her baseline anxiety. With pain medications and atarax given for anxiety her pain and anxiety improved.  On date of discharge patient had appropriately progressed and met criteria for safe discharge home with the support of family - she stated she felt safe and comfortable to discharge.  She was discharged with instructions to take tramadol she already has at home for pain to alternate with tylenol, ibuprofen, lidocaine patch, and robaxin for pain control. She was discharged with atarax for anxiety.  I discussed discharge instructions with patient as well as return precautions and all questions and concerns were  addressed.   I or a member of my team have reviewed this patient in the Controlled Substance Database.  Patient agrees to follow up as below.   Allergies as of 08/01/2020       Reactions   Geodon [ziprasidone Hcl] Other (See Comments)   Caused manic episode   Penicillins Swelling, Rash   Has patient had a PCN reaction causing immediate rash, facial/tongue/throat swelling, SOB or lightheadedness with hypotension:YES Has patient had a PCN reaction causing severe rash involving mucus membranes or skin necrosis: NO Has patient had a PCN reaction that required hospitalization NO Has patient had a PCN reaction occurring within the last 10 years: NO If all of the above answers are "NO", then may proceed with Cephalosporin use.   Tamsulosin Rash        Medication List     STOP taking these medications    meloxicam 15 MG tablet Commonly known as: MOBIC       TAKE these medications    acetaminophen 500 MG tablet Commonly known as: TYLENOL Take 2 tablets (1,000 mg total) by mouth every 6 (six) hours as needed for mild pain or moderate pain. Do NOT take if you have taken APAP-Pamabrom-Pyrilamine   APAP-Pamabrom-Pyrilamine 500-25-15 MG Tabs Take 1 tablet by mouth daily as needed (menstrual cramps). Midol Complete Caffeine Free   gabapentin 300 MG capsule Commonly known as: NEURONTIN Take 1 capsule (300 mg total) by mouth 3 (three) times daily.   hydrOXYzine 25 MG tablet Commonly known as: ATARAX/VISTARIL Take 1 tablet (25 mg total) by mouth 3 (three) times daily as needed for anxiety.   ibuprofen 800 MG tablet  Commonly known as: ADVIL Take 1 tablet (800 mg total) by mouth 3 (three) times daily. Take with food.   lidocaine 5 % Commonly known as: LIDODERM Place 1 patch onto the skin daily. Remove & Discard patch within 12 hours or as directed by MD   methocarbamol 750 MG tablet Commonly known as: ROBAXIN Take 1 tablet (750 mg total) by mouth 3 (three) times daily.    multivitamin with minerals Tabs tablet Take 1 tablet by mouth daily with supper.   traMADol-acetaminophen 37.5-325 MG tablet Commonly known as: ULTRACET Take 1 tablet by mouth 4 (four) times daily as needed (pain).          Follow-up Information     [facility]. Call.   Specialty: Urgent Care Why: As needed, If symptoms worsen Contact information: [place] Follow up.   Why: follow up with [place] is not needed but please call with questions or concerns Contact information: [address] [place] [id number] [phone]                Signed: Caroll Rancher [facility] 08/01/2020, 4:20 PM Please see Amion for pager number during day hours 7:00am-4:30pm

## 2020-08-01 NOTE — Progress Notes (Signed)
   08/01/20 1126  Clinical Encounter Type  Visited With Patient  Visit Type Spiritual support  Referral From Nurse  Consult/Referral To Chaplain  Spiritual Encounters  Spiritual Needs Prayer  Stress Factors  Patient Stress Factors Major life changes  Chaplain had a strong encouraging visit with Jordan Hanson.  She talked about different things that have happened in her life and are currently going through.  Chaplain offered a listening ear, support, and prayer.    Chaplain Luv Mish Morgan-Simpson [phone]

## 2020-08-09 ENCOUNTER — Ambulatory Visit (INDEPENDENT_AMBULATORY_CARE_PROVIDER_SITE_OTHER): Payer: Medicaid Other | Admitting: Psychiatry

## 2020-08-09 ENCOUNTER — Encounter (HOSPITAL_COMMUNITY): Payer: Self-pay | Admitting: Psychiatry

## 2020-08-09 ENCOUNTER — Other Ambulatory Visit: Payer: Self-pay

## 2020-08-09 VITALS — BP 128/77 | HR 71 | Ht 65.0 in | Wt 135.0 lb

## 2020-08-09 DIAGNOSIS — F1290 Cannabis use, unspecified, uncomplicated: Secondary | ICD-10-CM | POA: Diagnosis not present

## 2020-08-09 DIAGNOSIS — F319 Bipolar disorder, unspecified: Secondary | ICD-10-CM | POA: Diagnosis not present

## 2020-08-09 DIAGNOSIS — F411 Generalized anxiety disorder: Secondary | ICD-10-CM

## 2020-08-09 MED ORDER — SERTRALINE HCL 25 MG PO TABS: 25.0000 mg | ORAL_TABLET | Freq: Every day | ORAL | 2 refills | Status: DC

## 2020-08-09 MED ORDER — LATUDA 20 MG PO TABS: 20.0000 mg | ORAL_TABLET | Freq: Every evening | ORAL | 2 refills | Status: DC
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
## 2020-08-16 ENCOUNTER — Ambulatory Visit (HOSPITAL_COMMUNITY)
Admission: EM | Admit: 2020-08-16 | Discharge: 2020-08-16 | Disposition: A | Discharge status: Home / Self Care | Payer: Medicaid Other

## 2020-08-16 ENCOUNTER — Encounter (HOSPITAL_COMMUNITY): Payer: Self-pay

## 2020-08-16 DIAGNOSIS — S2242XA Multiple fractures of ribs, left side, initial encounter for closed fracture: Secondary | ICD-10-CM | POA: Diagnosis not present

## 2020-08-16 NOTE — ED Provider Notes (Signed)
[facility]    CSN: [id number] Arrival date & time: 08/16/20  1100      History   Chief Complaint No chief complaint on file.   HPI Jordan Hanson is a 25 y.o. female presenting for follow-up of multiple rib fractures and pneumothorax. Medical history noncontributory. Was hospitalized and discharged 7/23, following fall / landing on bedframe. States she is slowly feeling better but rib pain persists.  She has an active job but states that she is in pain throughout the day.  She is requiring a work note to return to work.  Denies shortness of breath.  Pain is currently moderately controlled on ibuprofen only.  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      Home Medications    Prior to Admission medications   Medication Sig Start Date End Date Taking? Authorizing Provider  gabapentin (NEURONTIN) 300 MG capsule Take 1 capsule (300 mg total) by mouth 3 (three) times daily. 08/01/20  Yes Eric Form, PA-C  methocarbamol (ROBAXIN) 750 MG tablet Take 1 tablet (750 mg total) by mouth 3 (three) times daily. 08/01/20  Yes Eric Form, PA-C  acetaminophen (TYLENOL) 500 MG tablet Take 2 tablets (1,000 mg total) by  mouth every 6 (six) hours as needed for mild pain or moderate pain. Do NOT take if you have taken APAP-Pamabrom-Pyrilamine 08/01/20   Eric Form, PA-C  APAP-Pamabrom-Pyrilamine 500-25-15 MG TABS Take 1 tablet by mouth daily as needed (menstrual cramps). Midol Complete Caffeine Free    [provider]  hydrOXYzine (ATARAX/VISTARIL) 25 MG tablet Take 1 tablet (25 mg total) by mouth 3 (three) times daily as needed for anxiety. 08/01/20   Eric Form, PA-C  ibuprofen (ADVIL) 800 MG tablet Take 1 tablet (800 mg total) by mouth 3 (three) times daily. Take with food. 08/01/20   Eric Form, PA-C  lidocaine (LIDODERM) 5 % Place 1 patch onto the skin daily. Remove & Discard patch within 12 hours or as directed by MD 08/01/20   Eric Form, PA-C  lurasidone (LATUDA) 20 MG TABS tablet Take 1 tablet (20 mg total) by mouth at bedtime. 08/09/20   Shanna Cisco, NP  Multiple Vitamin (MULTIVITAMIN WITH MINERALS) TABS tablet Take 1 tablet by mouth daily with supper.    [provider]  sertraline (ZOLOFT) 25 MG tablet Take 1 tablet (25 mg total) by mouth daily. 08/09/20   Shanna Cisco, NP  traMADol-acetaminophen (ULTRACET) 37.5-325 MG tablet Take 1 tablet by mouth 4 (four) times daily as needed (pain).    [provider]  cetirizine (ZYRTEC ALLERGY) 10 MG tablet Take 1 tablet (10 mg total) by mouth daily. Patient not  taking: Reported on 07/30/2020 12/17/19 08/01/20  Particia Nearing, PA-C  fluticasone [facility]) 50 MCG/ACT nasal spray Place 1 spray into both nostrils daily. Patient not taking: Reported on 07/30/2020 12/17/19 08/01/20  Particia Nearing, PA-C    Family History Family History  Adopted: Yes    Social History Social History   Tobacco Use   Smoking status: Every Day    Packs/day: 1.00    Years: 6.00    Pack years: 6.00    Types: Cigarettes   Smokeless tobacco: Never  Vaping Use   Vaping Use: Never used  Substance Use Topics    Alcohol use: Yes    Alcohol/week: 14.0 standard drinks    Types: 14 Cans of beer per week   Drug use: Yes    Types: Marijuana    Comment: 7.0 grams per week     Allergies   Geodon [ziprasidone hcl], Penicillins, and Tamsulosin   Review of Systems Review of Systems  Constitutional:  Negative for chills, fever and unexpected weight change.  Respiratory:  Negative for chest tightness and shortness of breath.   Cardiovascular:  Negative for chest pain and palpitations.  Gastrointestinal:  Negative for abdominal pain, diarrhea, nausea and vomiting.  Genitourinary:  Negative for decreased urine volume, difficulty urinating and frequency.  Musculoskeletal:  Positive for back pain. Negative for arthralgias, gait problem, joint swelling, myalgias, neck pain and neck stiffness.  Skin:  Negative for wound.  Neurological:  Negative for dizziness, tremors, seizures, syncope, facial asymmetry, speech difficulty, weakness, light-headedness, numbness and headaches.  All other systems reviewed and are negative.   Physical Exam Triage Vital Signs ED Triage Vitals  Enc Vitals Group     BP 08/16/20 1148 113/79     Pulse Rate 08/16/20 1148 64     Resp 08/16/20 1148 18     Temp 08/16/20 1148 98.4 F (36.9 C)     Temp Source 08/16/20 1148 Oral     SpO2 08/16/20 1148 96 %     Weight --      Height --      Head Circumference --      Peak Flow --      Pain Score 08/16/20 1145 7     Pain Loc --      Pain Edu? --      Excl. in GC? --    No data found.  Updated Vital Signs BP 113/79 (BP Location: Right Arm)   Pulse 64   Temp 98.4 F (36.9 C) (Oral)   Resp 18   LMP 07/19/2020 (Approximate)   SpO2 96%   Visual Acuity Right Eye Distance:   Left Eye Distance:   Bilateral Distance:    Right Eye Near:   Left Eye Near:    Bilateral Near:     Physical Exam Vitals reviewed.  Constitutional:      General: She is not in acute distress.    Appearance: Normal appearance. She is not  ill-appearing.  HENT:     Head: Normocephalic and atraumatic.     Mouth/Throat:     Mouth: Mucous membranes are moist.     Comments: Moist mucous membranes Eyes:     Extraocular Movements: Extraocular movements intact.     Pupils: Pupils are equal, round, and reactive to light.  Cardiovascular:     Rate and Rhythm: Normal rate and regular rhythm.     Heart sounds: Normal heart sounds.  Pulmonary:     Effort: Pulmonary effort is normal.  Breath sounds: Normal breath sounds and air entry. No wheezing, rhonchi or rales.     Comments: BS full throughout Abdominal:     General: Bowel sounds are normal. There is no distension.     Palpations: Abdomen is soft. There is no mass.     Tenderness: There is no abdominal tenderness. There is no right CVA tenderness, left CVA tenderness, guarding or rebound.  Musculoskeletal:     Cervical back: Normal range of motion. No swelling, deformity, signs of trauma, rigidity, spasms, tenderness, bony tenderness or crepitus. No pain with movement.     Thoracic back: No swelling, deformity, signs of trauma, spasms, tenderness or bony tenderness. Normal range of motion. No scoliosis.     Lumbar back: No swelling, deformity, signs of trauma, spasms, tenderness or bony tenderness. Normal range of motion. Negative right straight leg raise test and negative left straight leg raise test. No scoliosis.     Comments: L lateral distal ribs- tenderness, without ecchymosis or obvious bony deformity. Pain elicited with abduction L arm against resistance, with flexion thoracic spine, with twisting towards the left. No midline spinous tenderness, deformity, stepoff.  Absolutely no other injury, deformity, tenderness, ecchymosis, abrasion.  Skin:    General: Skin is warm.     Capillary Refill: Capillary refill takes less than 2 seconds.     Comments: Good skin turgor  Neurological:     General: No focal deficit present.     Mental Status: She is alert and oriented to  person, place, and time.     Cranial Nerves: No cranial nerve deficit.  Psychiatric:        Mood and Affect: Mood normal.        Behavior: Behavior normal.        Thought Content: Thought content normal.        Judgment: Judgment normal.     UC Treatments / Results  Labs (all labs ordered are listed, but only abnormal results are displayed) Labs Reviewed - No data to display  EKG   Radiology No results found.  Procedures Procedures (including critical care time)  Medications Ordered in UC Medications - No data to display  Initial Impression / Assessment and Plan / UC Course  I have reviewed the triage vital signs and the nursing notes.  Pertinent labs & imaging results that were available during my care of the patient were reviewed by me and considered in my medical decision making (see chart for details).     This patient is a very pleasant 25 y.o. year old female presenting for recheck of multiple rib fractures.   -This patient was hospitalized due to multiple rib fractures and pneumothorax, discharged 07/30/20.  -CT chest 7/23: Nondisplaced fractures of the posterior left tenth and eleventh ribs.  She is still exquisitely tender to palpation. Given active job, she is not cleared to return to work at this time. Provided her with information for [facility]- follow-up with them for further evaluation and clearance.   ED return precautions discussed. Patient verbalizes understanding and agreement.     Final Clinical Impressions(s) / UC Diagnoses   Final diagnoses:  Closed fracture of multiple ribs of left side, initial encounter     Discharge Instructions      -You can take Tylenol up to 1000 mg 3 times daily, and ibuprofen up to 800 mg 3 times daily with food.  You can take these together, or alternate every 3-4 hours. -Continue ice -For clearance to return to work  and re-check, follow-up with an orthopedist. I recommend [facility] at [address]  [place], [place] [address]. You can schedule an appointment by calling [phone]) or online ([email or web address]), but they also have a walk-in clinic M-F 8a-8p and Sat 10a-3p.      ED Prescriptions   None    PDMP not reviewed this encounter.   Rhys Martini, PA-C 08/16/20 1242
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
## 2020-08-16 NOTE — Discharge Instructions (Addendum)
-  You can take Tylenol up to 1000 mg 3 times daily, and ibuprofen up to 800 mg 3 times daily with food.  You can take these together, or alternate every 3-4 hours. -Continue ice -For clearance to return to work and re-check, follow-up with an orthopedist. I recommend [facility] at [address], [place], [place] [address]. You can schedule an appointment by calling [phone]) or online ([email or web address]), but they also have a walk-in clinic M-F 8a-8p and Sat 10a-3p.

## 2020-08-28 ENCOUNTER — Ambulatory Visit (HOSPITAL_COMMUNITY)
Admission: EM | Admit: 2020-08-28 | Discharge: 2020-08-28 | Disposition: A | Discharge status: Home / Self Care | Payer: Medicaid Other

## 2020-08-28 ENCOUNTER — Other Ambulatory Visit: Payer: Self-pay

## 2020-08-28 NOTE — Discharge Instructions (Addendum)
Patient left prior to NP medical screening.
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
## 2020-08-28 NOTE — ED Notes (Signed)
Pt walked out of facility unbeknownst to staff.

## 2020-08-29 NOTE — ED Provider Notes (Signed)
Jordan Hanson is a 25y/o female. Patient presented to [facility] voluntarily and unaccompanied. Per TTS counselor Kerman Passey, LMFT patient presented with chief complaint of passive suicidal ideation without plans or intent, denies HI/AVH/access to guns or other weapons. Patient left [facility] prior to assessment by this NP. MSE was not completed due to patient leaving prematurely. Patient came to [facility] voluntarily and doesn't meet IVC criteria.

## 2020-08-30 ENCOUNTER — Telehealth (HOSPITAL_COMMUNITY): Payer: Self-pay | Admitting: Psychiatry

## 2020-08-30 ENCOUNTER — Other Ambulatory Visit (HOSPITAL_COMMUNITY): Payer: Self-pay | Admitting: Psychiatry

## 2020-08-30 DIAGNOSIS — F319 Bipolar disorder, unspecified: Secondary | ICD-10-CM

## 2020-08-30 MED ORDER — LURASIDONE HCL 40 MG PO TABS: 40.0000 mg | ORAL_TABLET | Freq: Every day | ORAL | 2 refills | Status: DC
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
## 2020-08-30 NOTE — Telephone Encounter (Signed)
Patient called to report approximately a week ago she began having outbursts after beginning new medication 08/09/20. Please advise pt.  Confirmed ph# [phone]

## 2020-09-20 ENCOUNTER — Ambulatory Visit (HOSPITAL_COMMUNITY): Payer: Medicaid Other | Admitting: Psychiatry

## 2020-11-02 ENCOUNTER — Other Ambulatory Visit (HOSPITAL_COMMUNITY): Payer: Self-pay | Admitting: Psychiatry

## 2020-11-02 DIAGNOSIS — F319 Bipolar disorder, unspecified: Secondary | ICD-10-CM

## 2020-11-02 DIAGNOSIS — F411 Generalized anxiety disorder: Secondary | ICD-10-CM

## 2020-11-09 ENCOUNTER — Other Ambulatory Visit: Payer: Self-pay

## 2020-11-09 ENCOUNTER — Encounter (HOSPITAL_COMMUNITY): Payer: Self-pay | Admitting: Psychiatry

## 2020-11-09 ENCOUNTER — Ambulatory Visit (INDEPENDENT_AMBULATORY_CARE_PROVIDER_SITE_OTHER): Payer: Medicaid Other | Admitting: Psychiatry

## 2020-11-09 DIAGNOSIS — F411 Generalized anxiety disorder: Secondary | ICD-10-CM

## 2020-11-09 DIAGNOSIS — F319 Bipolar disorder, unspecified: Secondary | ICD-10-CM

## 2020-11-09 MED ORDER — LURASIDONE HCL 40 MG PO TABS: 40.0000 mg | ORAL_TABLET | Freq: Every day | ORAL | 3 refills | Status: DC

## 2020-11-09 MED ORDER — SERTRALINE HCL 50 MG PO TABS: 50.0000 mg | ORAL_TABLET | Freq: Every day | ORAL | 3 refills | Status: DC

## 2020-11-09 MED ORDER — TRAZODONE HCL 50 MG PO TABS
50.0000 mg | ORAL_TABLET | Freq: Every day | ORAL | 3 refills | Status: DC
Start: 2020-11-09 — End: 2021-01-25
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
## 2020-11-12 ENCOUNTER — Emergency Department (HOSPITAL_COMMUNITY)
Admission: EM | Admit: 2020-11-12 | Discharge: 2020-11-12 | Disposition: A | Discharge status: Home / Self Care | Payer: Medicaid Other | Attending: Emergency Medicine | Admitting: Emergency Medicine

## 2020-11-12 ENCOUNTER — Encounter (HOSPITAL_COMMUNITY): Payer: Self-pay | Admitting: Emergency Medicine

## 2020-11-12 ENCOUNTER — Other Ambulatory Visit: Payer: Self-pay

## 2020-11-12 ENCOUNTER — Emergency Department (HOSPITAL_COMMUNITY): Payer: Medicaid Other

## 2020-11-12 DIAGNOSIS — S0001XA Abrasion of scalp, initial encounter: Secondary | ICD-10-CM | POA: Insufficient documentation

## 2020-11-12 DIAGNOSIS — F17210 Nicotine dependence, cigarettes, uncomplicated: Secondary | ICD-10-CM | POA: Insufficient documentation

## 2020-11-12 DIAGNOSIS — S50812A Abrasion of left forearm, initial encounter: Secondary | ICD-10-CM | POA: Insufficient documentation

## 2020-11-12 DIAGNOSIS — F0781 Postconcussional syndrome: Secondary | ICD-10-CM

## 2020-11-12 DIAGNOSIS — R110 Nausea: Secondary | ICD-10-CM | POA: Insufficient documentation

## 2020-11-12 DIAGNOSIS — S50811A Abrasion of right forearm, initial encounter: Secondary | ICD-10-CM | POA: Insufficient documentation

## 2020-11-12 DIAGNOSIS — M542 Cervicalgia: Secondary | ICD-10-CM | POA: Insufficient documentation

## 2020-11-12 MED ORDER — LIDOCAINE 5 % EX PTCH
1.0000 | MEDICATED_PATCH | Freq: Once | CUTANEOUS | Status: DC
  Administered 2020-11-12: 1 via TRANSDERMAL
  Filled 2020-11-12: qty 1

## 2020-11-12 MED ORDER — TIZANIDINE HCL 2 MG PO CAPS: 2.0000 mg | ORAL_CAPSULE | Freq: Three times a day (TID) | ORAL | 0 refills | Status: AC | PRN

## 2020-11-12 MED ORDER — ACETAMINOPHEN 325 MG PO TABS
650.0000 mg | ORAL_TABLET | Freq: Once | ORAL | Status: AC
  Administered 2020-11-12: 650 mg via ORAL
  Filled 2020-11-12: qty 2

## 2020-11-12 MED ORDER — KETOROLAC TROMETHAMINE 30 MG/ML IJ SOLN
30.0000 mg | Freq: Once | INTRAMUSCULAR | Status: AC
  Administered 2020-11-12: 30 mg via INTRAMUSCULAR
  Filled 2020-11-12: qty 1

## 2020-11-12 NOTE — Discharge Instructions (Addendum)
Dear [name],  Thank you for allowing [place] to take care of you today.  We hope you begin feeling better soon.  - Please follow-up with your primary care physician or schedule an appointment to establish a primary care doctor if you do not have one already. - Please return to the Emergency Department or call 911 for chest pain, shortness of breath, severe pain, altered mental status, or if you have any reason to think you may need emergency medical care. -May use ibuprofen or Tylenol as directed as needed every 6-8 hours for pain.  May use lidocaine patches every 24 hours to neck.  May use ice or heat to area as needed.  Do not place directly on skin -Follow up with sports medicine clinic as listed above for postconcussion care -Establish a PCP.  Call the number listed above to follow-up in the next week -May use Zanaflex as directed as needed for muscle spasms and neck.  Do not operate heavy machinery, use with other sedating drugs, or drive while on this medication.  Do not use with alcohol.   Sincerely,  Dwaine Gale, DO Department of Emergency Medicine [place]   Post concussion syndrome
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
## 2020-12-07 ENCOUNTER — Encounter (HOSPITAL_COMMUNITY): Payer: Self-pay | Admitting: Emergency Medicine

## 2020-12-07 ENCOUNTER — Other Ambulatory Visit: Payer: Self-pay

## 2020-12-07 ENCOUNTER — Ambulatory Visit (HOSPITAL_COMMUNITY)
Admission: EM | Admit: 2020-12-07 | Discharge: 2020-12-07 | Disposition: A | Discharge status: Home / Self Care | Payer: Medicaid Other | Attending: Emergency Medicine | Admitting: Emergency Medicine

## 2020-12-07 DIAGNOSIS — Z20822 Contact with and (suspected) exposure to covid-19: Secondary | ICD-10-CM | POA: Diagnosis not present

## 2020-12-07 DIAGNOSIS — J069 Acute upper respiratory infection, unspecified: Secondary | ICD-10-CM

## 2020-12-07 DIAGNOSIS — R0602 Shortness of breath: Secondary | ICD-10-CM | POA: Insufficient documentation

## 2020-12-07 DIAGNOSIS — R0789 Other chest pain: Secondary | ICD-10-CM | POA: Diagnosis not present

## 2020-12-07 DIAGNOSIS — J029 Acute pharyngitis, unspecified: Secondary | ICD-10-CM | POA: Diagnosis not present

## 2020-12-07 DIAGNOSIS — R059 Cough, unspecified: Secondary | ICD-10-CM | POA: Insufficient documentation

## 2020-12-07 LAB — POC URINE PREG, ED: Preg Test, Ur: NEGATIVE

## 2020-12-07 MED ORDER — PREDNISONE 10 MG (21) PO TBPK: ORAL_TABLET | Freq: Every day | ORAL | 0 refills | Status: DC

## 2020-12-07 NOTE — ED Triage Notes (Addendum)
Pt presents with productive cough and chest tightness xs 3 days. Pt concerned with possible pregnancy.

## 2020-12-07 NOTE — Discharge Instructions (Addendum)
It's ok to continue using Dayquil and Nyquil to manage your symptoms.  I would like you to add Mucinex (generic guaifenesin is okay to use)-  I think it will help with your congestion and cough.  Make sure to drink lots of liquids to stay hydrated.

## 2020-12-07 NOTE — ED Provider Notes (Signed)
[place]    CSN: [id number] Arrival date & time: 12/07/20  [id number]      History   Chief Complaint Chief Complaint  Patient presents with   Cough   Chest Pain    HPI Jordan SITTLER is a 25 y.o. female.  Patient reports mild URI symptoms starting 12/04/2020.  Started as a mild sore throat, and then she developed nasal congestion and cough.  She has been using DayQuil and NyQuil to manage her symptoms.  Last night she felt short of breath and chest tightness that has persisted to this morning.  She also reports increasing fatigue.  Denies fever, reports chills.  Denies myalgias.  Has not tested self for COVID at home.  Also requests pregnancy test.  Reports had unprotected sex in October and has not had a period since prior to that encounter.  She is worried she could be pregnant.   Cough Associated symptoms: chills, rhinorrhea, shortness of breath and sore throat   Associated symptoms: no chest pain, no fever and no wheezing   Chest Pain Associated symptoms: cough, fatigue and shortness of breath   Associated symptoms: no fever    Past Medical History:  Diagnosis Date   ADHD (attention deficit hyperactivity disorder)    Anxiety    Bipolar disorder ([place])    Depression     Patient Active Problem List   Diagnosis Date Noted   Marijuana user 08/09/2020   Pneumothorax 07/30/2020   GAD (generalized anxiety disorder) 07/18/2020   Palpitations 10/02/2019   Migraines 05/18/2019   Irregular periods 05/18/2019   Borderline personality disorder ([place]) 02/13/2016   Bipolar 1 disorder, depressed ([place]) 02/10/2016   Suicidal ideation 07/19/2014   MDD (major depressive disorder), recurrent severe, 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      Home Medications    Prior to  Admission medications   Medication Sig Start Date End Date Taking? Authorizing Provider  predniSONE (STERAPRED UNI-PAK 21 TAB) 10 MG (21) TBPK tablet Take by mouth daily. Take 6 tabs by mouth daily  for 2 days, then 5 tabs for 2 days, then 4 tabs for 2 days, then 3 tabs for 2 days, 2 tabs for 2 days, then 1 tab by mouth daily for 2 days 12/07/20  Yes Sia Gabrielsen, Dionne Bucy, NP  acetaminophen (TYLENOL) 500 MG tablet Take 2 tablets (1,000 mg total) by mouth every 6 (six) hours as needed for mild pain or moderate pain. Do NOT take if you have taken APAP-Pamabrom-Pyrilamine 08/01/20   Winferd Humphrey, PA-C  APAP-Pamabrom-Pyrilamine 500-25-15 MG TABS Take 1 tablet by mouth daily as needed (menstrual cramps). Midol Complete Caffeine Free    [provider]  gabapentin (NEURONTIN) 300 MG capsule Take 1 capsule (300 mg total) by mouth 3 (three) times daily. 08/01/20   Winferd Humphrey, PA-C  hydrOXYzine (ATARAX/VISTARIL) 25 MG tablet Take 1 tablet (25 mg total) by mouth 3 (three) times daily as needed for anxiety. 08/01/20   Winferd Humphrey, PA-C  ibuprofen (ADVIL) 800 MG tablet Take 1 tablet (800 mg total) by mouth 3 (three) times daily. Take with food. 08/01/20   Winferd Humphrey, PA-C  lidocaine (LIDODERM) 5 % Place 1 patch onto the skin daily. Remove & Discard patch within 12  hours or as directed by MD 08/01/20   Eric Form, PA-C  lurasidone (LATUDA) 40 MG TABS tablet Take 1 tablet (40 mg total) by mouth daily with breakfast. 11/09/20   Shanna Cisco, NP  methocarbamol (ROBAXIN) 750 MG tablet Take 1 tablet (750 mg total) by mouth 3 (three) times daily. 08/01/20   Eric Form, PA-C  Multiple Vitamin (MULTIVITAMIN WITH MINERALS) TABS tablet Take 1 tablet by mouth daily with supper.    [provider]  sertraline (ZOLOFT) 50 MG tablet Take 1 tablet (50 mg total) by mouth daily. 11/09/20   Shanna Cisco, NP  traMADol-acetaminophen (ULTRACET) 37.5-325 MG tablet Take 1 tablet by  mouth 4 (four) times daily as needed (pain).    [provider]  traZODone (DESYREL) 50 MG tablet Take 1 tablet (50 mg total) by mouth at bedtime. 11/09/20   Shanna Cisco, NP  cetirizine (ZYRTEC ALLERGY) 10 MG tablet Take 1 tablet (10 mg total) by mouth daily. Patient not taking: Reported on 07/30/2020 12/17/19 08/01/20  Particia Nearing, PA-C  fluticasone [facility]) 50 MCG/ACT nasal spray Place 1 spray into both nostrils daily. Patient not taking: Reported on 07/30/2020 12/17/19 08/01/20  Particia Nearing, PA-C    Family History Family History  Adopted: Yes    Social History Social History   Tobacco Use   Smoking status: Every Day    Packs/day: 1.00    Years: 6.00    Pack years: 6.00    Types: Cigarettes   Smokeless tobacco: Never  Vaping Use   Vaping Use: Never used  Substance Use Topics   Alcohol use: Yes    Alcohol/week: 14.0 standard drinks    Types: 14 Cans of beer per week   Drug use: Yes    Types: Marijuana    Comment: 7.0 grams per week     Allergies   Geodon [ziprasidone hcl], Penicillins, and Tamsulosin   Review of Systems Review of Systems  Constitutional:  Positive for chills and fatigue. Negative for fever.  HENT:  Positive for congestion, postnasal drip, rhinorrhea and sore throat.   Respiratory:  Positive for cough and shortness of breath. Negative for wheezing.   Cardiovascular:  Negative for chest pain.    Physical Exam Triage Vital Signs ED Triage Vitals  Enc Vitals Group     BP 12/07/20 0952 128/88     Pulse Rate 12/07/20 0952 80     Resp 12/07/20 0952 16     Temp 12/07/20 0952 98.7 F (37.1 C)     Temp Source 12/07/20 0952 Oral     SpO2 12/07/20 0952 98 %     Weight --      Height --      Head Circumference --      Peak Flow --      Pain Score 12/07/20 0949 6     Pain Loc --      Pain Edu? --      Excl. in GC? --    No data found.  Updated Vital Signs BP 128/88 (BP Location: Right Arm)   Pulse 80   Temp  98.7 F (37.1 C) (Oral)   Resp 16   LMP 10/22/2020   SpO2 98%   Visual Acuity Right Eye Distance:   Left Eye Distance:   Bilateral Distance:    Right Eye Near:   Left Eye Near:    Bilateral Near:     Physical Exam Constitutional:  General: She is not in acute distress.    Appearance: She is well-developed. She is ill-appearing.  HENT:     Right Ear: Tympanic membrane, ear canal and external ear normal.     Left Ear: Tympanic membrane, ear canal and external ear normal.     Nose: Congestion present.     Mouth/Throat:     Mouth: Mucous membranes are moist.     Pharynx: Oropharynx is clear.  Cardiovascular:     Rate and Rhythm: Normal rate and regular rhythm.  Pulmonary:     Effort: Pulmonary effort is normal.     Breath sounds: Normal breath sounds.  Neurological:     Mental Status: She is alert.     UC Treatments / Results  Labs (all labs ordered are listed, but only abnormal results are displayed) Labs Reviewed  SARS CORONAVIRUS 2 (TAT 6-24 HRS)  POC URINE PREG, ED    EKG   Radiology No results found.  Procedures Procedures (including critical care time)  Medications Ordered in UC Medications - No data to display  Initial Impression / Assessment and Plan / UC Course  I have reviewed the triage vital signs and the nursing notes.  Pertinent labs & imaging results that were available during my care of the patient were reviewed by me and considered in my medical decision making (see chart for details).    Pregnancy test negative.  Patient has a URI.  COVID test pending.  Lungs are clear at this time but patient reports feeling her chest is tight.  I offered an albuterol inhaler and oral prednisone.  Patient declined albuterol but is interested in the prednisone.  Given note for work.  Reviewed supportive care measures.  Final Clinical Impressions(s) / UC Diagnoses   Final diagnoses:  Viral upper respiratory tract infection     Discharge  Instructions      It's ok to continue using Dayquil and Nyquil to manage your symptoms.  I would like you to add Mucinex (generic guaifenesin is okay to use)-  I think it will help with your congestion and cough.  Make sure to drink lots of liquids to stay hydrated.   ED Prescriptions     Medication Sig Dispense Auth. Provider   predniSONE (STERAPRED UNI-PAK 21 TAB) 10 MG (21) TBPK tablet Take by mouth daily. Take 6 tabs by mouth daily  for 2 days, then 5 tabs for 2 days, then 4 tabs for 2 days, then 3 tabs for 2 days, 2 tabs for 2 days, then 1 tab by mouth daily for 2 days 42 tablet Rosalin Buster, Dionne Bucy, NP      PDMP not reviewed this encounter.   Carvel Getting, NP 12/07/20 1025

## 2020-12-08 LAB — SARS CORONAVIRUS 2 (TAT 6-24 HRS): SARS Coronavirus 2: NEGATIVE

## 2021-01-17 ENCOUNTER — Ambulatory Visit (HOSPITAL_COMMUNITY)
Admission: EM | Admit: 2021-01-17 | Discharge: 2021-01-17 | Disposition: A | Discharge status: Home / Self Care | Payer: Medicaid Other | Attending: Emergency Medicine | Admitting: Emergency Medicine

## 2021-01-17 ENCOUNTER — Encounter (HOSPITAL_COMMUNITY): Payer: Self-pay

## 2021-01-17 DIAGNOSIS — R197 Diarrhea, unspecified: Secondary | ICD-10-CM

## 2021-01-17 DIAGNOSIS — R1032 Left lower quadrant pain: Secondary | ICD-10-CM

## 2021-01-17 LAB — POCT URINALYSIS DIPSTICK, ED / UC
Bilirubin Urine: NEGATIVE
Glucose, UA: NEGATIVE mg/dL
Ketones, ur: NEGATIVE mg/dL
Leukocytes,Ua: NEGATIVE
Nitrite: NEGATIVE
Protein, ur: NEGATIVE mg/dL
Specific Gravity, Urine: 1.02 (ref 1.005–1.030)
Urobilinogen, UA: 0.2 mg/dL (ref 0.0–1.0)
pH: 6 (ref 5.0–8.0)

## 2021-01-17 LAB — POC URINE PREG, ED: Preg Test, Ur: NEGATIVE

## 2021-01-17 MED ORDER — DICYCLOMINE HCL 20 MG PO TABS: 20.0000 mg | ORAL_TABLET | Freq: Two times a day (BID) | ORAL | 0 refills | Status: DC

## 2021-01-17 NOTE — ED Provider Notes (Signed)
[place]    CSN: [id number] Arrival date & time: 01/17/21  0825      History   Chief Complaint Chief Complaint  Patient presents with   Abdominal Pain    Abdominal pain with diarrhea stools that started yesterday.    HPI Jordan Hanson is a 26 y.o. female.   Patient is here with left lower quadrant abdominal pain with diarrhea x3 times.  Patient states that her cycle was normal in December and it is not time for her menstrual cycle.  Denies any vaginal discharge no burning on urination.  Patient denies any fevers no lower abdominal pain on the right side.  Has not taken anything prior to arrival   Past Medical History:  Diagnosis Date   ADHD (attention deficit hyperactivity disorder)    Anxiety    Bipolar disorder [facility])    Depression     Patient Active Problem List   Diagnosis Date Noted   Marijuana user 08/09/2020   Pneumothorax 07/30/2020   GAD (generalized anxiety disorder) 07/18/2020   Palpitations 10/02/2019   Migraines 05/18/2019   Irregular periods 05/18/2019   Borderline personality disorder ([place]) 02/13/2016   Bipolar 1 disorder, depressed ([place]) 02/10/2016   Suicidal ideation 07/19/2014   MDD (major depressive disorder), recurrent severe, 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      Home Medications    Prior to Admission medications   Medication Sig Start Date End Date Taking? Authorizing Provider  dicyclomine (BENTYL) 20 MG tablet Take 1 tablet (20 mg total) by mouth 2 (two) times daily. 01/17/21  Yes Marney Setting, NP  acetaminophen (TYLENOL) 500 MG tablet Take 2 tablets (1,000 mg total) by mouth every 6 (six) hours as needed for mild pain or moderate pain. Do NOT take if you have taken APAP-Pamabrom-Pyrilamine 08/01/20   Winferd Humphrey, PA-C   APAP-Pamabrom-Pyrilamine 500-25-15 MG TABS Take 1 tablet by mouth daily as needed (menstrual cramps). Midol Complete Caffeine Free    [provider]  gabapentin (NEURONTIN) 300 MG capsule Take 1 capsule (300 mg total) by mouth 3 (three) times daily. 08/01/20   Winferd Humphrey, PA-C  hydrOXYzine (ATARAX/VISTARIL) 25 MG tablet Take 1 tablet (25 mg total) by mouth 3 (three) times daily as needed for anxiety. 08/01/20   Winferd Humphrey, PA-C  ibuprofen (ADVIL) 800 MG tablet Take 1 tablet (800 mg total) by mouth 3 (three) times daily. Take with food. 08/01/20   Winferd Humphrey, PA-C  lidocaine (LIDODERM) 5 % Place 1 patch onto the skin daily. Remove & Discard patch within 12 hours or as directed by MD 08/01/20   Winferd Humphrey, PA-C  lurasidone (LATUDA) 40 MG TABS tablet Take 1 tablet (40 mg total) by mouth daily with breakfast. 11/09/20   Salley Slaughter, NP  methocarbamol (ROBAXIN) 750 MG tablet Take 1 tablet (750 mg total) by mouth 3 (three) times daily. 08/01/20   Winferd Humphrey, PA-C  Multiple Vitamin (MULTIVITAMIN WITH MINERALS) TABS tablet Take 1 tablet by mouth daily with supper.    [provider]  predniSONE (STERAPRED UNI-PAK 21 TAB) 10 MG (21) TBPK tablet Take by mouth daily. Take 6 tabs by mouth daily  for 2 days, then 5 tabs for 2 days, then  4 tabs for 2 days, then 3 tabs for 2 days, 2 tabs for 2 days, then 1 tab by mouth daily for 2 days 12/07/20   Carvel Getting, NP  sertraline (ZOLOFT) 50 MG tablet Take 1 tablet (50 mg total) by mouth daily. 11/09/20   Salley Slaughter, NP  traMADol-acetaminophen (ULTRACET) 37.5-325 MG tablet Take 1 tablet by mouth 4 (four) times daily as needed (pain).    [provider]  traZODone (DESYREL) 50 MG tablet Take 1 tablet (50 mg total) by mouth at bedtime. 11/09/20   Salley Slaughter, NP  cetirizine (ZYRTEC ALLERGY) 10 MG tablet Take 1 tablet (10 mg total) by mouth daily. Patient not taking: Reported on 07/30/2020  12/17/19 08/01/20  Volney American, PA-C  fluticasone [facility]) 50 MCG/ACT nasal spray Place 1 spray into both nostrils daily. Patient not taking: Reported on 07/30/2020 12/17/19 08/01/20  Volney American, PA-C    Family History Family History  Adopted: Yes    Social History Social History   Tobacco Use   Smoking status: Every Day    Packs/day: 1.00    Years: 6.00    Pack years: 6.00    Types: Cigarettes   Smokeless tobacco: Never  Vaping Use   Vaping Use: Never used  Substance Use Topics   Alcohol use: Yes    Alcohol/week: 14.0 standard drinks    Types: 14 Cans of beer per week   Drug use: Yes    Types: Marijuana    Comment: 7.0 grams per week     Allergies   Geodon [ziprasidone hcl], Penicillins, and Tamsulosin   Review of Systems Review of Systems  Constitutional:  Positive for fever. Negative for chills and fatigue.  Respiratory: Negative.    Cardiovascular: Negative.   Gastrointestinal:  Positive for abdominal pain and diarrhea. Negative for nausea and vomiting.  Genitourinary: Negative.   Neurological: Negative.     Physical Exam Triage Vital Signs ED Triage Vitals  Enc Vitals Group     BP 01/17/21 0836 133/84     Pulse Rate 01/17/21 0836 88     Resp 01/17/21 0836 16     Temp 01/17/21 0836 98.4 F (36.9 C)     Temp Source 01/17/21 0836 Oral     SpO2 01/17/21 0836 100 %     Weight --      Height --      Head Circumference --      Peak Flow --      Pain Score 01/17/21 0837 7     Pain Loc --      Pain Edu? --      Excl. in [place]? --    No data found.  Updated Vital Signs BP 133/84 (BP Location: Left Arm)    Pulse 88    Temp 98.4 F (36.9 C) (Oral)    Resp 16    LMP 01/01/2021 (Exact Date)    SpO2 100%   Visual Acuity Right Eye Distance:   Left Eye Distance:   Bilateral Distance:    Right Eye Near:   Left Eye Near:    Bilateral Near:     Physical Exam Constitutional:      Appearance: She is well-developed.  Abdominal:      General: Abdomen is flat. Bowel sounds are normal.     Palpations: Abdomen is soft.     Tenderness: There is abdominal tenderness in the left lower quadrant. There is no right CVA tenderness or left CVA  tenderness.  Neurological:     General: No focal deficit present.     Mental Status: She is alert.     UC Treatments / Results  Labs (all labs ordered are listed, but only abnormal results are displayed) Labs Reviewed  POCT URINALYSIS DIPSTICK, ED / UC - Abnormal; Notable for the following components:      Result Value   Hgb urine dipstick SMALL (*)    All other components within normal limits  POC URINE PREG, ED    EKG   Radiology No results found.  Procedures Procedures (including critical care time)  Medications Ordered in UC Medications - No data to display  Initial Impression / Assessment and Plan / UC Course  I have reviewed the triage vital signs and the nursing notes.  Pertinent labs & imaging results that were available during my care of the patient were reviewed by me and considered in my medical decision making (see chart for details).     Expressed to patient that we are not able to rule out IBS and diverticulitis or ovarian cyst.  Patient will need to be seen in the emergency room for an ultrasound  Patient can take pain medicine as needed and if not better in the next 24 hours needs to go to the emergency room.  Final Clinical Impressions(s) / UC Diagnoses   Final diagnoses:  Left lower quadrant abdominal pain  Diarrhea, unspecified type     Discharge Instructions      Your urine does not show any signs for infection You will need to have an ultrasound or CT scan done completed to rule out any other possibilities Go to the emergency room in the next 24 hours if pain persist You may possibly have an ovarian cyst not able to rule this out     ED Prescriptions     Medication Sig Dispense Auth. Provider   dicyclomine (BENTYL) 20 MG tablet Take  1 tablet (20 mg total) by mouth 2 (two) times daily. 20 tablet Marney Setting, NP      PDMP not reviewed this encounter.   Marney Setting, NP 01/17/21 [phone]

## 2021-01-17 NOTE — Discharge Instructions (Addendum)
Your urine does not show any signs for infection You will need to have an ultrasound or CT scan done completed to rule out any other possibilities Go to the emergency room in the next 24 hours if pain persist You may possibly have an ovarian cyst not able to rule this out

## 2021-01-17 NOTE — ED Triage Notes (Signed)
Pt presents to the office for lower abdominal pain with diarrhea stools.

## 2021-01-25 ENCOUNTER — Encounter (HOSPITAL_COMMUNITY): Payer: Self-pay | Admitting: Psychiatry

## 2021-01-25 ENCOUNTER — Telehealth (INDEPENDENT_AMBULATORY_CARE_PROVIDER_SITE_OTHER): Payer: Medicaid Other | Admitting: Psychiatry

## 2021-01-25 DIAGNOSIS — F319 Bipolar disorder, unspecified: Secondary | ICD-10-CM

## 2021-01-25 DIAGNOSIS — F411 Generalized anxiety disorder: Secondary | ICD-10-CM

## 2021-01-25 MED ORDER — LURASIDONE HCL 40 MG PO TABS: 40.0000 mg | ORAL_TABLET | Freq: Every day | ORAL | 3 refills | Status: DC

## 2021-01-25 MED ORDER — TRAZODONE HCL 50 MG PO TABS: 50.0000 mg | ORAL_TABLET | Freq: Every day | ORAL | 3 refills | Status: DC

## 2021-01-25 MED ORDER — SERTRALINE HCL 50 MG PO TABS: 50.0000 mg | ORAL_TABLET | Freq: Every day | ORAL | 3 refills | Status: DC

## 2021-01-25 NOTE — Progress Notes (Signed)
BH MD/PA/NP OP Progress Note Virtual Visit via Video Note  I connected with Jordan Hanson on 01/25/21 at  1:30 PM EST by a video enabled telemedicine application and verified that I am speaking with the correct person using two identifiers.  Location: Patient: Home Provider: Clinic   I discussed the limitations of evaluation and management by telemedicine and the availability of in person appointments. The patient expressed understanding and agreed to proceed.  I provided 30 minutes of non-face-to-face time during this encounter.   01/25/2021 1:50 PM Jordan HalterKatia B Hanson  MRN:  [id number]  Chief Complaint: " My mood is stable and my meds are working" [employer]   HPI: 26 year old female seen today for follow-up psychiatric evaluation.  She has a psychiatric history of bipolar 1, depression, borderline personality, SI, marijuana use, and anxiety.  She is currently managed on Latuda 40 mg daily, Zoloft 50 mg daily, trazodone 25 to 50 mg nightly as needed, and gabapentin 300 mg 3 times daily (prescribed by patient's PCP for pain management).  She informed Clinical research associatewriter that her medications are effective in managing her psychiatric conditions.  Today she is well-groomed, pleasant, cooperative, engaged in conversation, and maintained eye contact.  She informed Clinical research associatewriter that her mood is stable and reports that her medications are working effectively.  Patient notes that she is no longer living in her car but is living with a friend.  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  Patient notes that she is adjusting to sleeping now that she works third shift.  She notes that she gets approximately 4 to 5 hours nightly of sleep but reports that trazodone is effective for managing her sleep.  Since her last visit she notes that her anxiety and depression has improved.  Provider conducted a GAD-7 and patient scored a 5, at her last visit she scored a 21.  Provider also  conducted PHQ-9 patient 23, at her last visit she scored a 20.  She endorses adequate sleep and appetite.  Today she denies SI/HI/VAH, mania, or paranoia.    No medication changes made today.  Patient agreeable to continue medication as prescribed.  She will follow up with her therapist.  No other concerns at this time. Visit Diagnosis:    ICD-10-CM   1. GAD (generalized anxiety disorder)  F41.1 sertraline (ZOLOFT) 50 MG tablet    traZODone (DESYREL) 50 MG tablet    2. Bipolar 1 disorder, depressed (HCC)  F31.9 sertraline (ZOLOFT) 50 MG tablet    traZODone (DESYREL) 50 MG tablet      Past Psychiatric History: bipolar 1, depression, borderline personality, SI, marijuana use, and anxiety Past Medical History:  Past Medical History:  Diagnosis Date   ADHD (attention deficit hyperactivity disorder)    Anxiety    Bipolar disorder (HCC)    Depression     Past Surgical History:  Procedure Laterality Date   SMALL INTESTINE SURGERY     TONSILLECTOMY      Family Psychiatric History: Patient is adopted  Family History:  Family History  Adopted: Yes    Social History:  Social History   Socioeconomic History   Marital status: Single    Spouse name: Not on file   Number of children: Not on file   Years of education: Not on file   Highest education level: Not on file  Occupational History   Not on file  Tobacco Use   Smoking  status: Every Day    Packs/day: 1.00    Years: 6.00    Pack years: 6.00    Types: Cigarettes   Smokeless tobacco: Never  Vaping Use   Vaping Use: Never used  Substance and Sexual Activity   Alcohol use: Yes    Alcohol/week: 14.0 standard drinks    Types: 14 Cans of beer per week   Drug use: Yes    Types: Marijuana    Comment: 7.0 grams per week   Sexual activity: Yes    Partners: Male    Birth control/protection: Condom, None    Comment: sig other together 4 years  Other Topics Concern   Not on file  Social History Narrative   Not on file    Social Determinants of Health   Financial Resource Strain: Low Risk    Difficulty of Paying Living Expenses: Not hard at all  Food Insecurity: No Food Insecurity   Worried About Programme researcher, broadcasting/film/videounning Out of Food in the Last Year: Never true   Ran Out of Food in the Last Year: Never true  Transportation Needs: No Transportation Needs   Lack of Transportation (Medical): No   Lack of Transportation (Non-Medical): No  Physical Activity: Sufficiently Active   Days of Exercise per Week: 7 days   Minutes of Exercise per Session: 60 min  Stress: Stress Concern Present   Feeling of Stress : Very much  Social Connections: Socially Isolated   Frequency of Communication with Friends and Family: Once a week   Frequency of Social Gatherings with Friends and Family: Once a week   Attends Religious Services: Never   Database administratorActive Member of Clubs or Organizations: No   Attends Engineer, structuralClub or Organization Meetings: Not on file   Marital Status: Living with partner    Allergies:  Allergies  Allergen Reactions   Geodon [Ziprasidone Hcl] Other (See Comments)    Caused manic episode   Penicillins Swelling and Rash    Has patient had a PCN reaction causing immediate rash, facial/tongue/throat swelling, SOB or lightheadedness with hypotension:YES Has patient had a PCN reaction causing severe rash involving mucus membranes or skin necrosis: NO Has patient had a PCN reaction that required hospitalization NO Has patient had a PCN reaction occurring within the last 10 years: NO If all of the above answers are "NO", then may proceed with Cephalosporin use.    Tamsulosin Rash    Metabolic Disorder Labs: Lab Results  Component Value Date   HGBA1C 5.0 06/01/2019   MPG 103 02/12/2016   MPG 117 06/19/2014   Lab Results  Component Value Date   PROLACTIN 48.7 (H) 02/12/2016   Lab Results  Component Value Date   CHOL 156 06/01/2019   TRIG 62 06/01/2019   HDL 43 06/01/2019   CHOLHDL 3.6 06/01/2019   VLDL 27 02/12/2016    LDLCALC 101 (H) 06/01/2019   LDLCALC 155 (H) 02/12/2016   Lab Results  Component Value Date   TSH 1.810 09/08/2019   TSH 1.550 05/18/2019    Therapeutic Level Labs: No results found for: LITHIUM Lab Results  Component Value Date   VALPROATE <10 (L) 07/19/2014   VALPROATE 46 (L) 06/19/2014   No components found for:  CBMZ  Current Medications: Current Outpatient Medications  Medication Sig Dispense Refill   acetaminophen (TYLENOL) 500 MG tablet Take 2 tablets (1,000 mg total) by mouth every 6 (six) hours as needed for mild pain or moderate pain. Do NOT take if you have taken APAP-Pamabrom-Pyrilamine 30 tablet 0  APAP-Pamabrom-Pyrilamine 500-25-15 MG TABS Take 1 tablet by mouth daily as needed (menstrual cramps). Midol Complete Caffeine Free     dicyclomine (BENTYL) 20 MG tablet Take 1 tablet (20 mg total) by mouth 2 (two) times daily. 20 tablet 0   gabapentin (NEURONTIN) 300 MG capsule Take 1 capsule (300 mg total) by mouth 3 (three) times daily. 30 capsule 0   ibuprofen (ADVIL) 800 MG tablet Take 1 tablet (800 mg total) by mouth 3 (three) times daily. Take with food. 30 tablet 0   lidocaine (LIDODERM) 5 % Place 1 patch onto the skin daily. Remove & Discard patch within 12 hours or as directed by MD 30 patch 0   lurasidone (LATUDA) 40 MG TABS tablet Take 1 tablet (40 mg total) by mouth daily with breakfast. 30 tablet 3   methocarbamol (ROBAXIN) 750 MG tablet Take 1 tablet (750 mg total) by mouth 3 (three) times daily. 60 tablet 0   Multiple Vitamin (MULTIVITAMIN WITH MINERALS) TABS tablet Take 1 tablet by mouth daily with supper.     predniSONE (STERAPRED UNI-PAK 21 TAB) 10 MG (21) TBPK tablet Take by mouth daily. Take 6 tabs by mouth daily  for 2 days, then 5 tabs for 2 days, then 4 tabs for 2 days, then 3 tabs for 2 days, 2 tabs for 2 days, then 1 tab by mouth daily for 2 days 42 tablet 0   sertraline (ZOLOFT) 50 MG tablet Take 1 tablet (50 mg total) by mouth daily. 30 tablet 3    traMADol-acetaminophen (ULTRACET) 37.5-325 MG tablet Take 1 tablet by mouth 4 (four) times daily as needed (pain).     traZODone (DESYREL) 50 MG tablet Take 1 tablet (50 mg total) by mouth at bedtime. 30 tablet 3   No current facility-administered medications for this visit.     Musculoskeletal: Strength & Muscle Tone:  Unable to assess due to telehealth visit Gait & Station:  Unable to assess due to telehealth visit Patient leans: N/A  Psychiatric Specialty Exam: Review of Systems  Last menstrual period 01/01/2021.There is no height or weight on file to calculate BMI.  General Appearance: Well Groomed  Eye Contact:  Good  Speech:  Clear and Coherent and Normal Rate  Volume:  Normal  Mood:  Euthymic  Affect:  Appropriate and Congruent  Thought Process:  Coherent, Goal Directed, and Linear  Orientation:  Full (Time, Place, and Person)  Thought Content: WDL and Logical   Suicidal Thoughts:  No  Homicidal Thoughts:  No  Memory:  Immediate;   Good Recent;   Good Remote;   Good  Judgement:  Good  Insight:  Good  Psychomotor Activity:  Normal  Concentration:  Concentration: Good and Attention Span: Good  Recall:  Good  Fund of Knowledge: Good  Language: Good  Akathisia:  No  Handed:  Right  AIMS (if indicated): not done  Assets:  Communication Skills Desire for Improvement Financial Resources/Insurance Physical Health Transportation  ADL's:  Intact  Cognition: WNL  Sleep:  Good   Screenings: AIMS    Flowsheet Row [facility] (Discharged[facility]) [facility]  AIMS Total Score 0 0 0      AUDIT    Flowsheet Row Counselor from 07/18/2020 in [facility] Ad[facility] (Discharged) [facility]osp-Ad[facility] (Discharged) from [facility]  Alcohol Use Disorder Identification Test Final Score (AUDIT) 9 0 4      CAGE-AID    Flowsheet Row ED to Hosp-Admission (Discharged) from 07/30/2020 in [facility]  SURGICAL  CAGE-AID Score 0      GAD-7    Flowsheet Row Video Visit from 01/25/2021 in [facility] Clinical Support from 11/09/2020 in [facility] Clinical Support from 08/09/2020 in [facility]  Total GAD-7 Score 5 21 20       PHQ2-9    Flowsheet Row Video Visit from 01/25/2021 in [facility] Clinical Support from 11/09/2020 in [facility] Clinical Support from 08/09/2020 in [facility] Counselor from 07/18/2020 in [facility] Office Visit from 05/18/2019 in Primary [facility] at [facility] Total Score 0 4 6 6  0  PHQ-9 Total Score 3 20 24 17  --      Flowsheet Row Video Visit from 01/25/2021 in [facility] ED from 01/17/2021 in [facility] ED from 12/07/2020 in [facility] at [facility] RISK CATEGORY No Risk No Risk Error: Question 6 not populated        Assessment and Plan: Patient notes her anxiety, sleep, and depression has improved since her last visit.  No medication changes made today.  Patient agreeable to continue medication as prescribed  1. GAD (generalized anxiety disorder)  Continue- sertraline (ZOLOFT) 50 MG tablet; Take 1 tablet (50 mg total) by mouth daily.  Dispense: 30 tablet; Refill: 3 Continue- traZODone (DESYREL) 50 MG tablet; Take 1 tablet (50 mg total) by mouth at bedtime.  Dispense: 30 tablet; Refill: 3  2. Bipolar 1 disorder, depressed (HCC)  Continue- lurasidone (LATUDA) 40 MG TABS tablet;  Take 1 tablet (40 mg total) by mouth daily with breakfast.  Dispense: 30 tablet; Refill: 3 Continue- sertraline (ZOLOFT) 50 MG tablet; Take 1 tablet (50 mg total) by mouth daily.  Dispense: 30 tablet; Refill: 3 Continue- traZODone (DESYREL) 50 MG tablet; Take 1 tablet (50 mg total) by mouth at bedtime.  Dispense: 30 tablet; Refill: 3   Follow-up in 3 months Follow-up with therapy  12/09/2020, NP 01/25/2021, 1:50 PM

## 2021-01-31 ENCOUNTER — Other Ambulatory Visit: Payer: Self-pay

## 2021-01-31 ENCOUNTER — Ambulatory Visit (HOSPITAL_COMMUNITY)
Admission: EM | Admit: 2021-01-31 | Discharge: 2021-01-31 | Disposition: A | Discharge status: Home / Self Care | Payer: Medicaid Other | Attending: Emergency Medicine | Admitting: Emergency Medicine

## 2021-01-31 ENCOUNTER — Encounter (HOSPITAL_COMMUNITY): Payer: Self-pay

## 2021-01-31 DIAGNOSIS — I808 Phlebitis and thrombophlebitis of other sites: Secondary | ICD-10-CM | POA: Diagnosis not present

## 2021-01-31 MED ORDER — NAPROXEN 500 MG PO TABS: 500.0000 mg | ORAL_TABLET | Freq: Two times a day (BID) | ORAL | 0 refills | Status: DC

## 2021-01-31 NOTE — Discharge Instructions (Addendum)
Continue warm compresses as often as you can.  The Aleve will help with pain and will help break up the clot.  This will resolve in several weeks.  Below is a list of primary care practices who are taking new patients for you to follow-up with.  [facility] -multiple locations.  See website at [email or web address]  [facility] internal medicine clinic Ground Floor - [facility], [address] [place], [place], [place] [address] [phone]  [facility] Primary Care at [facility] [address] [place], [place] [address] [phone]  [facility] [address] [place], [place] [address] [phone]  [facility] [phone] [address] [place] [address]  [facility]: [address] [place] [address]  [phone]  [facility] Family Medicine: [address] [place] [address]  [phone]  [facility] : [address] [place] [address] [phone]  [facility] Primary Care: [address] [place] [address] [phone]  [facility]: [address] [place] [address] [phone]  Dr. Oneal Grout [address] [facility] [place] [address]  [phone]  Go to www.goodrx.com  or www.costplusdrugs.com to look up your medications. This will give you a list of where you can find your prescriptions at the most affordable prices. Or ask the pharmacist what the cash price is, or if they have any other discount programs available to help make your medication more affordable. This can be less expensive than what you would pay with insurance.

## 2021-01-31 NOTE — ED Provider Notes (Signed)
HPI  SUBJECTIVE:  Jordan Hanson is a 26 y.o. female who presents with burning forearm pain from her elbow to her hand and a painful, palpable cord in her distal right forearm after having an IV in her right hand 2 weeks ago.  She received IV promethazine, morphine, and a liter of fluids through this IV.  She reports burning with the promethazine injection, and states that it never resolved.  She reports some hand swelling, no bruising, skin discoloration redness, forearm swelling, numbness or tingling in her fingers, fevers.  She states that the pain has slowly gotten better over the past week.  She has tried heat, Aleve, brace.  Heat and Aleve help.  Symptoms are worse when she uses her arm or hand.  No antipyretic in past 6 hours.  Past medical history negative for diabetes, DVT/PE.  LMP: Now.  Denies possibility being pregnant.  PMD: None    Past Medical History:  Diagnosis Date   ADHD (attention deficit hyperactivity disorder)    Anxiety    Bipolar disorder (HCC)    Depression     Past Surgical History:  Procedure Laterality Date   SMALL INTESTINE SURGERY     TONSILLECTOMY      Family History  Adopted: Yes    Social History   Tobacco Use   Smoking status: Every Day    Packs/day: 1.00    Years: 6.00    Pack years: 6.00    Types: Cigarettes   Smokeless tobacco: Never  Vaping Use   Vaping Use: Never used  Substance Use Topics   Alcohol use: Yes    Alcohol/week: 14.0 standard drinks    Types: 14 Cans of beer per week   Drug use: Yes    Types: Marijuana    Comment: 7.0 grams per week    No current facility-administered medications for this encounter.  Current Outpatient Medications:    naproxen (NAPROSYN) 500 MG tablet, Take 1 tablet (500 mg total) by mouth 2 (two) times daily., Disp: 20 tablet, Rfl: 0   acetaminophen (TYLENOL) 500 MG tablet, Take 2 tablets (1,000 mg total) by mouth every 6 (six) hours as needed for mild pain or moderate pain. Do NOT take if you have  taken APAP-Pamabrom-Pyrilamine, Disp: 30 tablet, Rfl: 0   APAP-Pamabrom-Pyrilamine 500-25-15 MG TABS, Take 1 tablet by mouth daily as needed (menstrual cramps). Midol Complete Caffeine Free, Disp: , Rfl:    dicyclomine (BENTYL) 20 MG tablet, Take 1 tablet (20 mg total) by mouth 2 (two) times daily., Disp: 20 tablet, Rfl: 0   gabapentin (NEURONTIN) 300 MG capsule, Take 1 capsule (300 mg total) by mouth 3 (three) times daily., Disp: 30 capsule, Rfl: 0   lidocaine (LIDODERM) 5 %, Place 1 patch onto the skin daily. Remove & Discard patch within 12 hours or as directed by MD, Disp: 30 patch, Rfl: 0   lurasidone (LATUDA) 40 MG TABS tablet, Take 1 tablet (40 mg total) by mouth daily with breakfast., Disp: 30 tablet, Rfl: 3   Multiple Vitamin (MULTIVITAMIN WITH MINERALS) TABS tablet, Take 1 tablet by mouth daily with supper., Disp: , Rfl:    sertraline (ZOLOFT) 50 MG tablet, Take 1 tablet (50 mg total) by mouth daily., Disp: 30 tablet, Rfl: 3   traMADol-acetaminophen (ULTRACET) 37.5-325 MG tablet, Take 1 tablet by mouth 4 (four) times daily as needed (pain)., Disp: , Rfl:    traZODone (DESYREL) 50 MG tablet, Take 1 tablet (50 mg total) by mouth at bedtime., Disp:  30 tablet, Rfl: 3  Allergies  Allergen Reactions   Geodon [Ziprasidone Hcl] Other (See Comments)    Caused manic episode   Penicillins Swelling and Rash    Has patient had a PCN reaction causing immediate rash, facial/tongue/throat swelling, SOB or lightheadedness with hypotension:YES Has patient had a PCN reaction causing severe rash involving mucus membranes or skin necrosis: NO Has patient had a PCN reaction that required hospitalization NO Has patient had a PCN reaction occurring within the last 10 years: NO If all of the above answers are "NO", then may proceed with Cephalosporin use.    Tamsulosin Rash     ROS  As noted in HPI.   Physical Exam  BP 122/81 (BP Location: Left Arm)    Pulse 96    Temp 98.2 F (36.8 C) (Oral)     Resp 18    LMP 01/31/2021 (Exact Date)    SpO2 98%   Constitutional: Well developed, well nourished, no acute distress Eyes:  EOMI, conjunctiva normal bilaterally HENT: Normocephalic, atraumatic,mucus membranes moist Respiratory: Normal inspiratory effort Cardiovascular: Normal rate GI: nondistended skin: No rash, skin intact Musculoskeletal: Positive tender 6 cm cord distal right forearm.  No overlying erythema.  Forearms symmetric, no edema.  No other tenderness over the entire arm including the AC fossa, along the brachial vein, or underlying the forearm.  RP 2+, sensation grossly intact in median/radial/ulnar distribution.  Hand nontender, no overlying skin changes, evidence of necrosis or infection. Neurologic: Alert & oriented x 3, no focal neuro deficits Psychiatric: Speech and behavior appropriate   ED Course   Medications - No data to display  No orders of the defined types were placed in this encounter.   No results found for this or any previous visit (from the past 24 hour(s)). No results found.  ED Clinical Impression  1. Superficial thrombophlebitis of right upper extremity      ED Assessment/Plan  Presentation consistent with a superficial thrombophlebitis of the forearm.  She states that the pain is gradually improving.  There does not appear to be any evidence of tissue necrosis.  doubt DVT.  Discussed the possibility of doing an ultrasound with the patient, but I think that the likelihood of a DVT is low, so we decided to defer this today.  She is amenable to this plan.  Will send home with Aleve 500 mg twice a day, continue warm compresses.  If he gets worse, if if it moves, if she starts having forearm edema, she will go to the ED.  Will provide primary care list and order assistance in finding a PMD.  Discussed MDM, treatment plan, and plan for follow-up with patient. Discussed sn/sx that should prompt return to the ED. patient agrees with plan.   Meds ordered  this encounter  Medications   naproxen (NAPROSYN) 500 MG tablet    Sig: Take 1 tablet (500 mg total) by mouth 2 (two) times daily.    Dispense:  20 tablet    Refill:  0      *This clinic note was created using Scientist, clinical (histocompatibility and immunogenetics). Therefore, there may be occasional mistakes despite careful proofreading.  ?    Domenick Gong, MD 01/31/21 1408

## 2021-01-31 NOTE — ED Triage Notes (Signed)
Pt states went to the ED on 01/18/2021 and received promethazine IV. States felt a burn when receiving it. States her hand is still swollen and pain.

## 2021-02-15 ENCOUNTER — Emergency Department (HOSPITAL_COMMUNITY)
Admission: EM | Admit: 2021-02-15 | Discharge: 2021-02-15 | Disposition: A | Discharge status: Home / Self Care | Payer: Medicaid Other | Attending: Emergency Medicine | Admitting: Emergency Medicine

## 2021-02-15 ENCOUNTER — Other Ambulatory Visit: Payer: Self-pay

## 2021-02-15 DIAGNOSIS — T43592A Poisoning by other antipsychotics and neuroleptics, intentional self-harm, initial encounter: Secondary | ICD-10-CM | POA: Insufficient documentation

## 2021-02-15 DIAGNOSIS — T43011A Poisoning by tricyclic antidepressants, accidental (unintentional), initial encounter: Secondary | ICD-10-CM | POA: Diagnosis present

## 2021-02-15 DIAGNOSIS — T50901A Poisoning by unspecified drugs, medicaments and biological substances, accidental (unintentional), initial encounter: Secondary | ICD-10-CM

## 2021-02-15 LAB — COMPREHENSIVE METABOLIC PANEL
ALT: 16 U/L (ref 0–44)
AST: 23 U/L (ref 15–41)
Albumin: 4 g/dL (ref 3.5–5.0)
Alkaline Phosphatase: 50 U/L (ref 38–126)
Anion gap: 9 (ref 5–15)
BUN: 12 mg/dL (ref 6–20)
CO2: 19 mmol/L — ABNORMAL LOW (ref 22–32)
Calcium: 8.9 mg/dL (ref 8.9–10.3)
Chloride: 111 mmol/L (ref 98–111)
Creatinine, Ser: 0.39 mg/dL — ABNORMAL LOW (ref 0.44–1.00)
GFR, Estimated: >60 mL/min (ref >60)
Glucose, Bld: 78 mg/dL (ref 70–99)
Potassium: 3.7 mmol/L (ref 3.5–5.1)
Sodium: 139 mmol/L (ref 135–145)
Total Bilirubin: 0.2 mg/dL — ABNORMAL LOW (ref 0.3–1.2)
Total Protein: 6.8 g/dL (ref 6.5–8.1)

## 2021-02-15 LAB — CBC
HCT: 41.7 % (ref 36.0–46.0)
Hemoglobin: 13.6 g/dL (ref 12.0–15.0)
MCH: 28 pg (ref 26.0–34.0)
MCHC: 32.6 g/dL (ref 30.0–36.0)
MCV: 86 fL (ref 80.0–100.0)
Platelets: 262 10*3/uL (ref 150–400)
RBC: 4.85 MIL/uL (ref 3.87–5.11)
RDW: 13.9 % (ref 11.5–15.5)
WBC: 9.4 10*3/uL (ref 4.0–10.5)
nRBC: 0 % (ref 0.0–0.2)

## 2021-02-15 LAB — RAPID URINE DRUG SCREEN, HOSP PERFORMED
Amphetamines: NOT DETECTED
Barbiturates: NOT DETECTED
Benzodiazepines: NOT DETECTED
Cocaine: NOT DETECTED
Opiates: NOT DETECTED
Tetrahydrocannabinol: POSITIVE — AB

## 2021-02-15 LAB — ETHANOL: Alcohol, Ethyl (B): <10 mg/dL (ref <10)

## 2021-02-15 LAB — ACETAMINOPHEN LEVEL: Acetaminophen (Tylenol), Serum: <10 ug/mL — ABNORMAL LOW (ref 10–30)

## 2021-02-15 LAB — SALICYLATE LEVEL: Salicylate Lvl: <7 mg/dL — ABNORMAL LOW (ref 7.0–30.0)

## 2021-02-15 LAB — HCG, QUANTITATIVE, PREGNANCY: hCG, Beta Chain, Quant, S: <1 m[IU]/mL (ref <5)

## 2021-02-15 MED ORDER — SODIUM CHLORIDE 0.9 % IV BOLUS
1000.0000 mL | Freq: Once | INTRAVENOUS | Status: AC
  Administered 2021-02-15: 1000 mL via INTRAVENOUS

## 2021-02-15 NOTE — ED Provider Notes (Signed)
[facility]-EMERGENCY DEPT Provider Note   CSN: [id number] Arrival date & time: 02/15/21  2038     History  Chief Complaint  Patient presents with   Ingestion    Jordan Hanson is a 26 y.o. female.  Patient presents chief complaint of accidental overdose.  She states that she was to try to go to sleep and took her trazodone but was unable to sleep.  She took 2 more several minutes later and then 2 more again several minutes later after that without being able to sleep.  She also states she took 2 latuda tablets as well.  Her friend Mr.John Marvis Moeller said he been trying to reach her was unable to reach her and grew concerned.  I spoke with him and he states that she never mentioned any suicidal thoughts or other concerning thoughts, however when he could not reach her on the phone he states he grew concerned and called EMS.  Patient herself states that she was just trying to sleep and having a difficult time falling asleep and took more medications than she typically does.  She denies any active suicidal thoughts denies any hallucinations.  She states that she has a full day tomorrow and is eager to get started.      Home Medications Prior to Admission medications   Medication Sig Start Date End Date Taking? Authorizing Provider  acetaminophen (TYLENOL) 500 MG tablet Take 2 tablets (1,000 mg total) by mouth every 6 (six) hours as needed for mild pain or moderate pain. Do NOT take if you have taken APAP-Pamabrom-Pyrilamine 08/01/20   Eric Form, PA-C  APAP-Pamabrom-Pyrilamine 500-25-15 MG TABS Take 1 tablet by mouth daily as needed (menstrual cramps). Midol Complete Caffeine Free    [provider]  dicyclomine (BENTYL) 20 MG tablet Take 1 tablet (20 mg total) by mouth 2 (two) times daily. 01/17/21   Coralyn Mark, NP  gabapentin (NEURONTIN) 300 MG capsule Take 1 capsule (300 mg total) by mouth 3 (three) times daily. 08/01/20   Eric Form, PA-C   lidocaine (LIDODERM) 5 % Place 1 patch onto the skin daily. Remove & Discard patch within 12 hours or as directed by MD 08/01/20   Eric Form, PA-C  lurasidone (LATUDA) 40 MG TABS tablet Take 1 tablet (40 mg total) by mouth daily with breakfast. 01/25/21   Shanna Cisco, NP  Multiple Vitamin (MULTIVITAMIN WITH MINERALS) TABS tablet Take 1 tablet by mouth daily with supper.    [provider]  naproxen (NAPROSYN) 500 MG tablet Take 1 tablet (500 mg total) by mouth 2 (two) times daily. 01/31/21   Domenick Gong, MD  sertraline (ZOLOFT) 50 MG tablet Take 1 tablet (50 mg total) by mouth daily. 01/25/21   Shanna Cisco, NP  traMADol-acetaminophen (ULTRACET) 37.5-325 MG tablet Take 1 tablet by mouth 4 (four) times daily as needed (pain).    [provider]  traZODone (DESYREL) 50 MG tablet Take 1 tablet (50 mg total) by mouth at bedtime. 01/25/21   Shanna Cisco, NP  cetirizine (ZYRTEC ALLERGY) 10 MG tablet Take 1 tablet (10 mg total) by mouth daily. Patient not taking: Reported on 07/30/2020 12/17/19 08/01/20  Particia Nearing, PA-C  fluticasone [facility]) 50 MCG/ACT nasal spray Place 1 spray into both nostrils daily. Patient not taking: Reported on 07/30/2020 12/17/19 08/01/20  Particia Nearing, PA-C      Allergies    Geodon [ziprasidone hcl], Penicillins, and Tamsulosin  Review of Systems   Review of Systems  Constitutional:  Negative for fever.  HENT:  Negative for ear pain.   Eyes:  Negative for pain.  Respiratory:  Negative for cough.   Cardiovascular:  Negative for chest pain.  Gastrointestinal:  Negative for abdominal pain.  Genitourinary:  Negative for flank pain.  Musculoskeletal:  Negative for back pain.  Skin:  Negative for rash.  Neurological:  Negative for headaches.   Physical Exam Updated Vital Signs BP 130/80    Pulse 87    Resp 18    LMP 01/31/2021 (Exact Date)    SpO2 99%  Physical Exam Constitutional:      General: She  is not in acute distress.    Appearance: Normal appearance.  HENT:     Head: Normocephalic.     Nose: Nose normal.  Eyes:     Extraocular Movements: Extraocular movements intact.  Cardiovascular:     Rate and Rhythm: Normal rate.  Pulmonary:     Effort: Pulmonary effort is normal.  Musculoskeletal:        General: Normal range of motion.     Cervical back: Normal range of motion.  Neurological:     General: No focal deficit present.     Mental Status: She is alert. Mental status is at baseline.  Psychiatric:     Comments: Patient demeanor appears appropriate.  She is cooperative answers all questions.  She is not withdrawn, does not appear depressed.  Denies any suicidal or homicidal thoughts.  Denies any hallucinations.    ED Results / Procedures / Treatments   Labs (all labs ordered are listed, but only abnormal results are displayed) Labs Reviewed  COMPREHENSIVE METABOLIC PANEL - Abnormal; Notable for the following components:      Result Value   CO2 19 (*)    Creatinine, Ser 0.39 (*)    Total Bilirubin 0.2 (*)    All other components within normal limits  SALICYLATE LEVEL - Abnormal; Notable for the following components:   Salicylate Lvl <7.0 (*)    All other components within normal limits  ACETAMINOPHEN LEVEL - Abnormal; Notable for the following components:   Acetaminophen (Tylenol), Serum <10 (*)    All other components within normal limits  RAPID URINE DRUG SCREEN, HOSP PERFORMED - Abnormal; Notable for the following components:   Tetrahydrocannabinol POSITIVE (*)    All other components within normal limits  ETHANOL  CBC  HCG, QUANTITATIVE, PREGNANCY  CBG MONITORING, ED  I-STAT BETA HCG BLOOD, ED ([facility], [facility], [facility] ONLY)    EKG None  Radiology No results found.  Procedures Procedures    Medications Ordered in ED Medications  sodium chloride 0.9 % bolus 1,000 mL (1,000 mLs Intravenous New Bag/Given 02/15/21 2239)    ED Course/ Medical Decision Making/  A&P                           Medical Decision Making Amount and/or Complexity of Data Reviewed Labs: ordered.   Patient herself does not have any thoughts of self-harm.  She does have a psychiatric history but states that she has no intention of hurting herself or others.  I spoke at length with her friend Mr. Madilyn Hook, who also states that she never expressed any suicidal or self-harm thoughts, he called EMS because he was concerned he could not reach her and that she has a history of psychiatric illness in the past.  Basic labs were  sent here which were unremarkable.  Patient appears calm and cooperative.  Declines further evaluation with her psychiatric team.  I do not feel her condition warrants holding her against her will.  Patient discharged home stable condition, advised outpatient follow-up with her psychiatrist within 2 to 3 days and advised immediate return for worsening symptoms or any additional concerns.        Final Clinical Impression(s) / ED Diagnoses Final diagnoses:  Accidental overdose, initial encounter    Rx / DC Orders ED Discharge Orders     None         Cheryll Cockayne, MD 02/15/21 2242

## 2021-02-15 NOTE — Discharge Instructions (Signed)
Call your primary care doctor or specialist as discussed in the next 2-3 days.   Return immediately back to the ER if:  Your symptoms worsen within the next 12-24 hours. You develop new symptoms such as new fevers, persistent vomiting, new pain, shortness of breath, or new weakness or numbness, or if you have any other concerns.  

## 2021-02-15 NOTE — ED Triage Notes (Signed)
Patient Jordan Hanson after ingesting 5 50mg  trazadone and 2 40mg  latuda at 6pm "to try to just go to sleep". Denies current SI, but endorses past attempt in the year. No n/v, but states she feels drowsy

## 2021-02-15 NOTE — ED Notes (Signed)
Misty Stanley from poison control recommends monitoring and symptomatic treatment. Tylenol level at 2200 and a cmp, with cardiac monitoring. IV fluids for hypotension, benzos for any seizures. Medical clearance at least 6hours after ingestion

## 2021-04-07 DIAGNOSIS — R0689 Other abnormalities of breathing: Secondary | ICD-10-CM | POA: Diagnosis not present

## 2021-04-07 DIAGNOSIS — R457 State of emotional shock and stress, unspecified: Secondary | ICD-10-CM | POA: Diagnosis not present

## 2021-04-07 DIAGNOSIS — I959 Hypotension, unspecified: Secondary | ICD-10-CM | POA: Diagnosis not present

## 2021-04-07 DIAGNOSIS — R0602 Shortness of breath: Secondary | ICD-10-CM | POA: Diagnosis not present

## 2021-04-07 DIAGNOSIS — R079 Chest pain, unspecified: Secondary | ICD-10-CM | POA: Diagnosis not present

## 2021-04-07 DIAGNOSIS — D72829 Elevated white blood cell count, unspecified: Secondary | ICD-10-CM | POA: Diagnosis not present

## 2021-04-07 DIAGNOSIS — E876 Hypokalemia: Secondary | ICD-10-CM | POA: Diagnosis not present

## 2021-04-09 DIAGNOSIS — R0602 Shortness of breath: Secondary | ICD-10-CM | POA: Diagnosis not present

## 2021-04-09 DIAGNOSIS — R079 Chest pain, unspecified: Secondary | ICD-10-CM | POA: Diagnosis not present

## 2021-04-10 ENCOUNTER — Telehealth (HOSPITAL_COMMUNITY): Payer: Medicaid Other | Admitting: Psychiatry

## 2021-04-18 IMAGING — CT CT ABD-PELV W/ CM
2 of 4 series · 15 of 46 positions shown, 17 images · IV contrast (omnipaque)
Comparison: CT abdomen pelvis 12/26/2017

CLINICAL DATA: Right lower quadrant abdominal pain and nausea

EXAM:
CT ABDOMEN AND PELVIS WITH CONTRAST
TECHNIQUE: Multidetector CT imaging of the abdomen and pelvis was performed
using the standard protocol following bolus administration of
intravenous contrast.
CONTRAST:  80mL OMNIPAQUE IOHEXOL 300 MG/ML  SOLN

[Series 3: abd/ pelvis 5.0 i30f 2 · axial · 0.68mm/px · z∈[+644,+1074]mm · 12 of 96 slices shown, 14 images]
[im 5/96  soft-tissue]
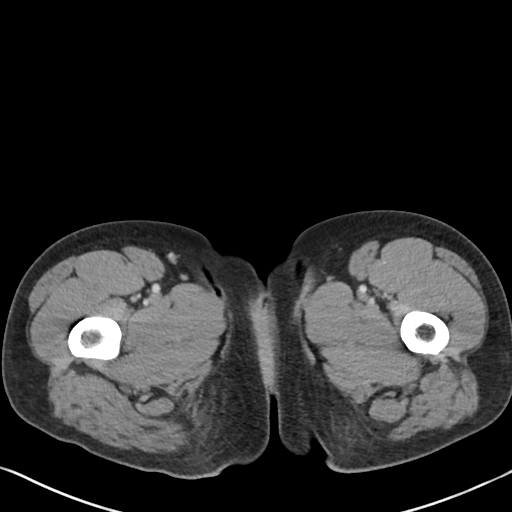
[im 5/96  bone]
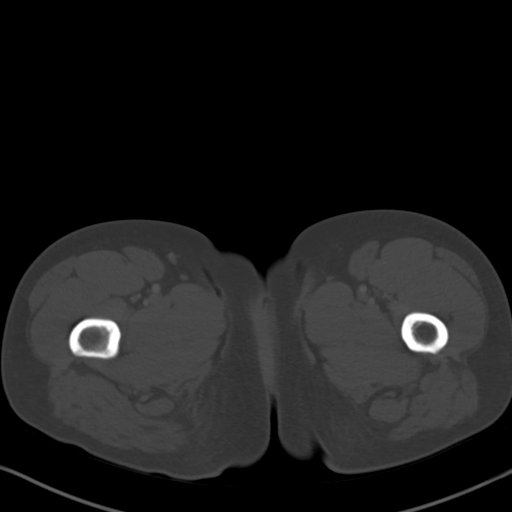
[im 13/96  soft-tissue]
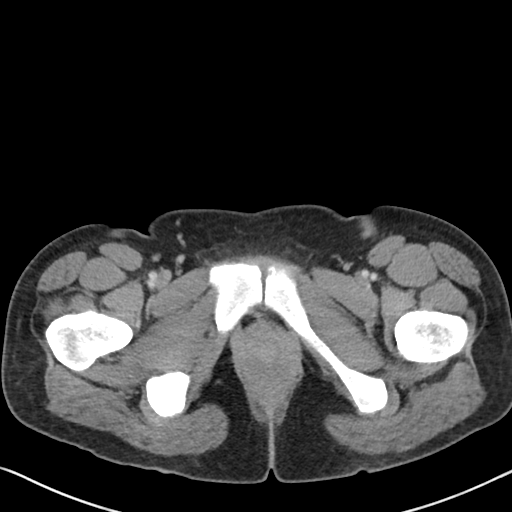
[im 21/96  soft-tissue]
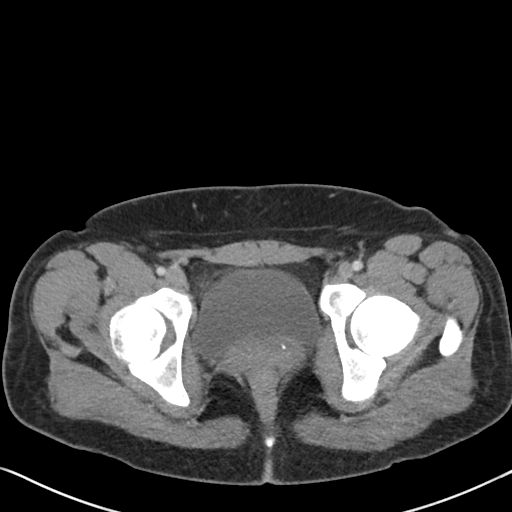
[im 29/96  soft-tissue]
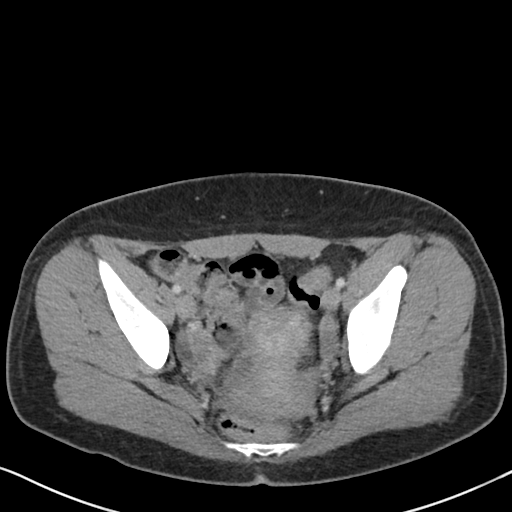
[im 38/96  soft-tissue]
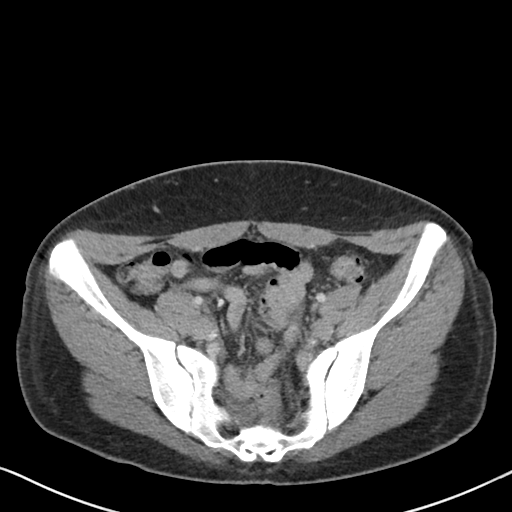
[im 46/96  soft-tissue]
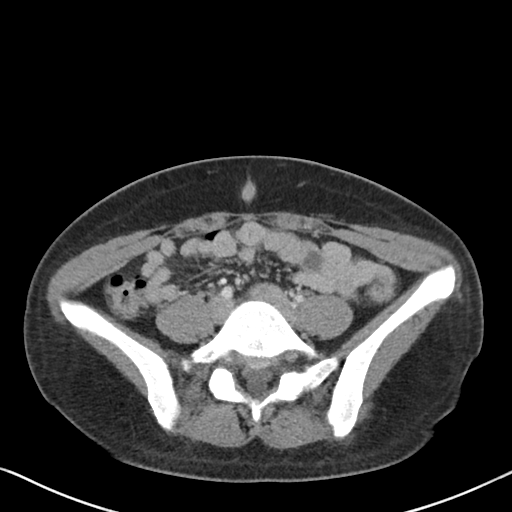
[im 50/96  soft-tissue]
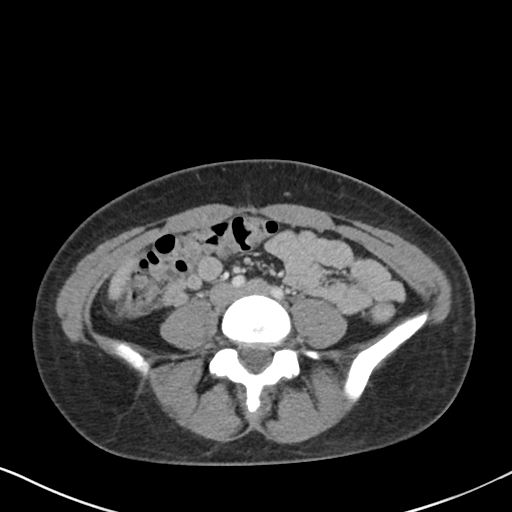
[im 58/96  soft-tissue]
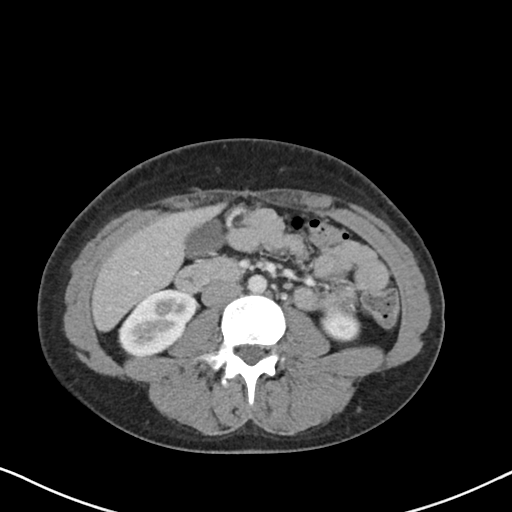
[im 67/96  soft-tissue]
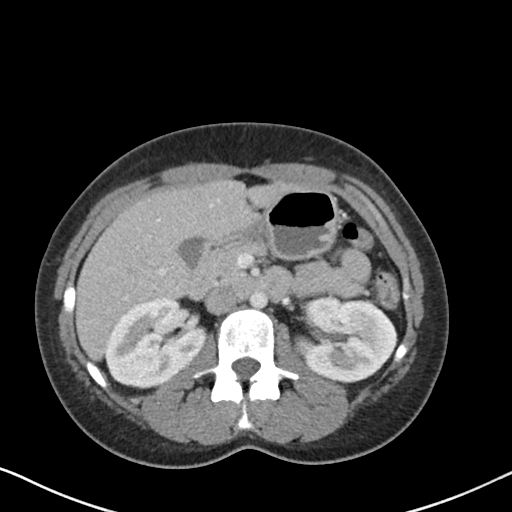
[im 67/96  bone]
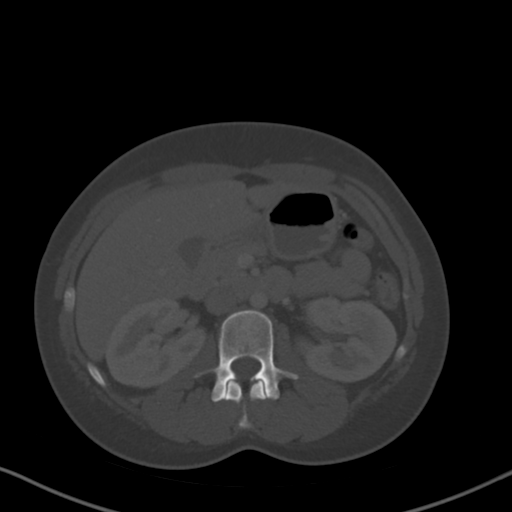
[im 75/96  soft-tissue]
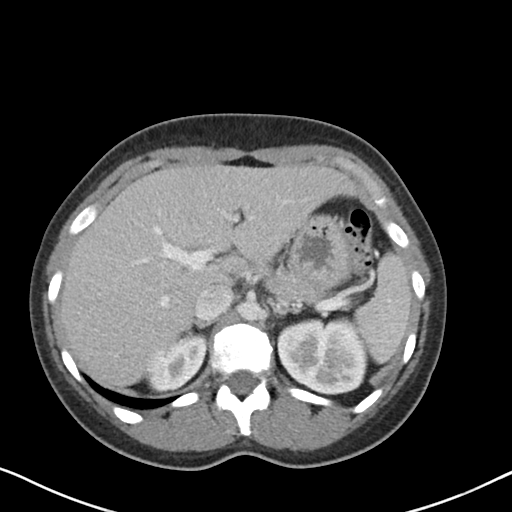
[im 83/96  soft-tissue]
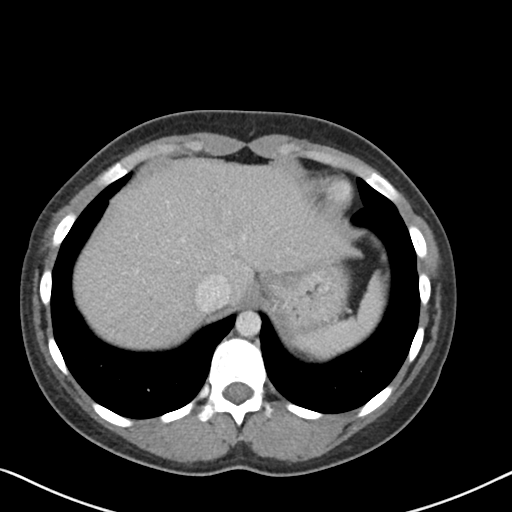
[im 91/96  soft-tissue]
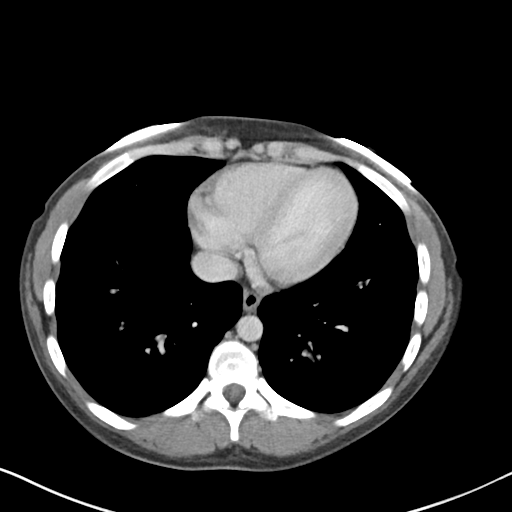

[Series 6: coronal soft tissue · coronal · 0.72mm/px · 3 of 84 slices shown]
[im 28/84  soft-tissue]
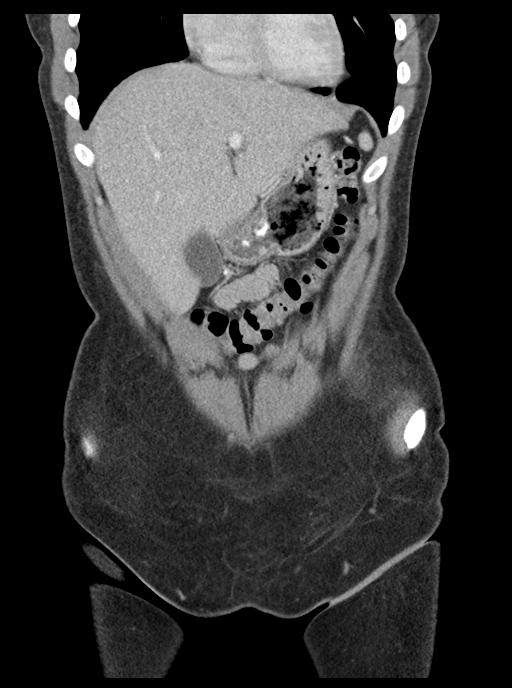
[im 37/84  soft-tissue]
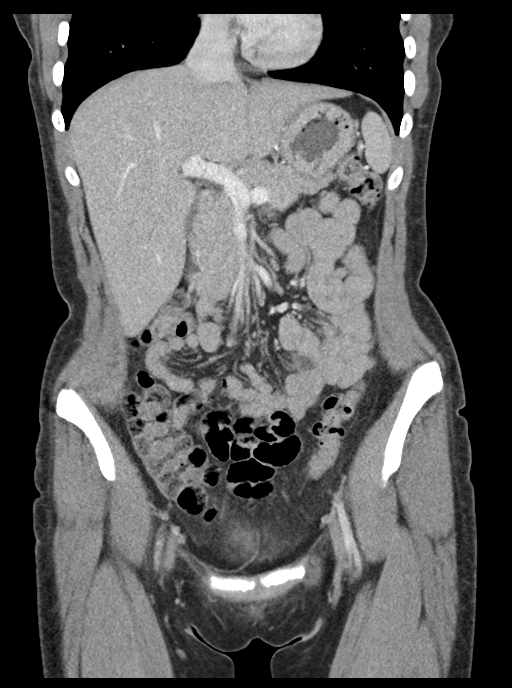
[im 47/84  soft-tissue]
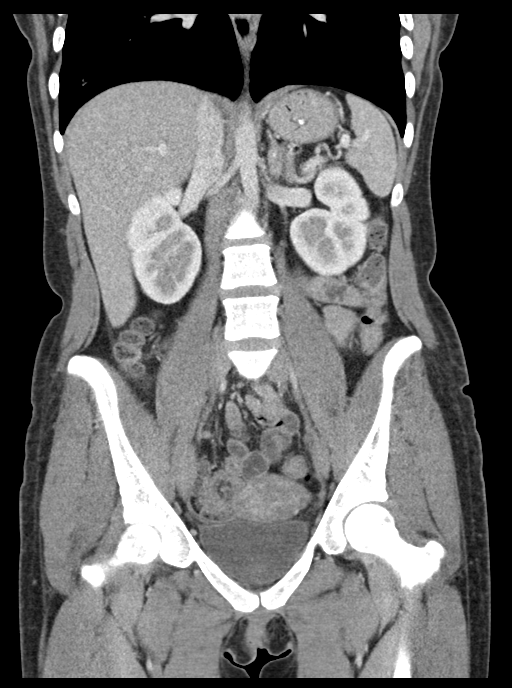

[15 of 46 positions shown; findings below may reference images not displayed]

FINDINGS: Lower chest: Lung bases are clear. Normal heart size. No pericardial
effusion.

Hepatobiliary: No focal liver abnormality is seen. No gallstones,
gallbladder wall thickening, or biliary dilatation.

Pancreas: Unremarkable. No pancreatic ductal dilatation or
surrounding inflammatory changes.

Spleen: Normal in size without focal abnormality.

Adrenals/Urinary Tract: Adrenal glands are unremarkable. Kidneys are
normal, without renal calculi, focal lesion, or hydronephrosis.
Bladder is unremarkable.

Stomach/Bowel: Distal esophagus, stomach and duodenal sweep are
unremarkable. No small bowel wall thickening or dilatation. No
evidence of obstruction. Normal appendix in the right lower
quadrant, coronal images 42-55. No colonic dilatation or wall
thickening. Scattered colonic diverticula without focal pericolonic
inflammation to suggest diverticulitis.

Vascular/Lymphatic: The aorta is normal caliber. No suspicious or
enlarged lymph nodes in the included lymphatic chains.

Reproductive: Normal anteverted uterus. 2.2 cm dominant follicle in
the right ovary. No concerning adnexal lesions.

Other: Trace low-attenuation fluid in the deep pelvis, may be
physiologic in a reproductive age female.

Musculoskeletal: No acute osseous abnormality or suspicious osseous
lesion. Bilateral L5 pars defects are similar to prior with grade 1
anterolisthesis L5 on S1.
IMPRESSION: 1. Trace low-attenuation fluid in the deep pelvis, may be
physiologic in a reproductive age female.
2. No other acute abnormality in the abdomen or pelvis to provide
cause for patient's right lower quadrant pain. Normal appendix.
3. Bilateral L5 pars defects with grade 1 anterolisthesis L5 on S1.

## 2021-05-11 ENCOUNTER — Encounter (HOSPITAL_COMMUNITY): Payer: Self-pay

## 2021-05-11 ENCOUNTER — Ambulatory Visit (HOSPITAL_COMMUNITY)
Admission: EM | Admit: 2021-05-11 | Discharge: 2021-05-11 | Disposition: A | Discharge status: Home / Self Care | Payer: Medicaid Other | Attending: Family Medicine | Admitting: Family Medicine

## 2021-05-11 DIAGNOSIS — R110 Nausea: Secondary | ICD-10-CM | POA: Insufficient documentation

## 2021-05-11 DIAGNOSIS — R52 Pain, unspecified: Secondary | ICD-10-CM | POA: Insufficient documentation

## 2021-05-11 DIAGNOSIS — J029 Acute pharyngitis, unspecified: Secondary | ICD-10-CM | POA: Diagnosis not present

## 2021-05-11 DIAGNOSIS — Z20822 Contact with and (suspected) exposure to covid-19: Secondary | ICD-10-CM | POA: Diagnosis not present

## 2021-05-11 DIAGNOSIS — Z87891 Personal history of nicotine dependence: Secondary | ICD-10-CM | POA: Diagnosis not present

## 2021-05-11 LAB — SARS CORONAVIRUS 2 (TAT 6-24 HRS): SARS Coronavirus 2: NEGATIVE

## 2021-05-11 LAB — POCT RAPID STREP A, ED / UC: Streptococcus, Group A Screen (Direct): NEGATIVE

## 2021-05-11 NOTE — Discharge Instructions (Addendum)
You were seen today for headache, nausea, and sore throat.  ?Your rapid strep was negative.  This will be sent for culture and you will be notified if positive or needing treatment.  ?We have done a covid test today.  This will be resulted tomorrow.  ?For now, your symptoms appear viral in nature.  I recommend tylenol or motrin for headache and sore throat, as well as salt water gargles.  Please take with food to avoid upset stomach.  ?

## 2021-05-11 NOTE — ED Provider Notes (Signed)
?[place] ? ? ? ?CSN: [id number] ?Arrival date & time: 05/11/21  1115 ? ? ?  ? ?History   ?Chief Complaint ?Chief Complaint  ?Patient presents with  ? Sore Throat  ? ? ?HPI ?Jordan Hanson is a 26 y.o. female.  ? ?Patient is here for 4 days of uri symptoms.  ?Started with headache and body aches.  Then with nausea, and today sore throat.  ?No fevers or chills.  ?No runny nose or cough.  ?No known sick contacts.  ?She has not taken any meds.  ? ?Past Medical History:  ?Diagnosis Date  ? ADHD (attention deficit hyperactivity disorder)   ? Anxiety   ? Bipolar disorder ([place])   ? Depression   ? ? ?Patient Active Problem List  ? Diagnosis Date Noted  ? Marijuana user 08/09/2020  ? Pneumothorax 07/30/2020  ? GAD (generalized anxiety disorder) 07/18/2020  ? Palpitations 10/02/2019  ? Migraines 05/18/2019  ? Irregular periods 05/18/2019  ? Borderline personality disorder ([place]) 02/13/2016  ? Bipolar 1 disorder, depressed ([place]) 02/10/2016  ? Suicidal ideation 07/19/2014  ? 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 MDD (major depressive disorder) 06/17/2014  ? ? ?Past Surgical History:  ?Procedure Laterality Date  ? SMALL INTESTINE SURGERY    ? TONSILLECTOMY    ? ? ?OB History   ?No obstetric history on file. ?  ? ? ? ?Home Medications   ? ?Prior to Admission medications   ?Medication Sig Start Date End Date Taking? Authorizing Provider  ?acetaminophen (TYLENOL) 500 MG tablet Take 2 tablets (1,000 mg total) by mouth every 6 (six) hours as needed for mild pain or moderate pain. Do NOT take if you have taken APAP-Pamabrom-Pyrilamine 08/01/20   Winferd Humphrey, PA-C  ?APAP-Pamabrom-Pyrilamine 500-25-15 MG TABS Take 1 tablet by mouth daily as needed (menstrual cramps). Midol Complete Caffeine Free    [provider]  ?dicyclomine (BENTYL) 20 MG tablet Take 1 tablet (20 mg total) by mouth 2 (two) times daily. 01/17/21   Marney Setting, NP  ?gabapentin (NEURONTIN)  300 MG capsule Take 1 capsule (300 mg total) by mouth 3 (three) times daily. 08/01/20   Winferd Humphrey, PA-C  ?lidocaine (LIDODERM) 5 % Place 1 patch onto the skin daily. Remove & Discard patch within 12 hours or as directed by MD 08/01/20   Winferd Humphrey, PA-C  ?lurasidone (LATUDA) 40 MG TABS tablet Take 1 tablet (40 mg total) by mouth daily with breakfast. 01/25/21   Salley Slaughter, NP  ?Multiple Vitamin (MULTIVITAMIN WITH MINERALS) TABS tablet Take 1 tablet by mouth daily with supper.    [provider]  ?naproxen (NAPROSYN) 500 MG tablet Take 1 tablet (500 mg total) by mouth 2 (two) times daily. 01/31/21   Melynda Ripple, MD  ?sertraline (ZOLOFT) 50 MG tablet Take 1 tablet (50 mg total) by mouth daily. 01/25/21   Salley Slaughter, NP  ?traMADol-acetaminophen (ULTRACET) 37.5-325 MG tablet Take 1 tablet by mouth 4 (four) times daily as needed (pain).    [provider]  ?traZODone (DESYREL) 50 MG tablet Take 1 tablet (50 mg total) by mouth at bedtime. 01/25/21   Salley Slaughter, NP  ?cetirizine (ZYRTEC ALLERGY) 10 MG tablet Take 1 tablet (10 mg total) by mouth daily. ?Patient not taking: Reported on 07/30/2020 12/17/19 08/01/20  Volney American, PA-C  ?fluticasone [facility]) 50 MCG/ACT nasal spray Place 1 spray into both nostrils daily. ?Patient not taking: Reported  on 07/30/2020 12/17/19 08/01/20  Volney American, PA-C  ? ? ?Family History ?Family History  ?Adopted: Yes  ? ? ?Social History ?Social History  ? ?Tobacco Use  ? Smoking status: Every Day  ?  Packs/day: 1.00  ?  Years: 6.00  ?  Pack years: 6.00  ?  Types: Cigarettes  ? Smokeless tobacco: Never  ?Vaping Use  ? Vaping Use: Never used  ?Substance Use Topics  ? Alcohol use: Yes  ?  Alcohol/week: 14.0 standard drinks  ?  Types: 14 Cans of beer per week  ? Drug use: Yes  ?  Types: Marijuana  ?  Comment: 7.0 grams per week  ? ? ? ?Allergies   ?Geodon [ziprasidone hcl], Penicillins, and Tamsulosin ? ? ?Review of  Systems ?Review of Systems  ?Constitutional:  Positive for fatigue. Negative for fever.  ?HENT:  Positive for sore throat. Negative for congestion and rhinorrhea.   ?Respiratory: Negative.    ?Cardiovascular: Negative.   ?Gastrointestinal:  Positive for abdominal pain and nausea.  ?Genitourinary: Negative.   ?Musculoskeletal: Negative.   ? ? ?Physical Exam ?Triage Vital Signs ?ED Triage Vitals [05/11/21 1217]  ?Enc Vitals Group  ?   BP (!) 127/50  ?   Pulse Rate 92  ?   Resp 18  ?   Temp 98.5 ?F (36.9 ?C)  ?   Temp Source Oral  ?   SpO2   ?   Weight   ?   Height   ?   Head Circumference   ?   Peak Flow   ?   Pain Score 6  ?   Pain Loc   ?   Pain Edu?   ?   Excl. in [place]?   ? ?No data found. ? ?Updated Vital Signs ?BP (!) 127/50 (BP Location: Left Arm)   Pulse 92   Temp 98.5 ?F (36.9 ?C) (Oral)   Resp 18   LMP 04/28/2021  ? ?Visual Acuity ?Right Eye Distance:   ?Left Eye Distance:   ?Bilateral Distance:   ? ?Right Eye Near:   ?Left Eye Near:    ?Bilateral Near:    ? ?Physical Exam ?Constitutional:   ?   Appearance: She is well-developed.  ?HENT:  ?   Head: Normocephalic and atraumatic.  ?   Nose: No congestion or rhinorrhea.  ?   Mouth/Throat:  ?   Mouth: Mucous membranes are moist.  ?   Pharynx: Posterior oropharyngeal erythema present. No pharyngeal swelling.  ?   Tonsils: No tonsillar exudate.  ?Eyes:  ?   Conjunctiva/sclera: Conjunctivae normal.  ?Cardiovascular:  ?   Rate and Rhythm: Normal rate and regular rhythm.  ?   Heart sounds: Normal heart sounds.  ?Pulmonary:  ?   Effort: Pulmonary effort is normal.  ?Abdominal:  ?   General: Bowel sounds are normal.  ?   Palpations: Abdomen is soft.  ?   Tenderness: There is abdominal tenderness in the epigastric area and periumbilical area.  ?Musculoskeletal:  ?   Cervical back: Normal range of motion and neck supple.  ?Lymphadenopathy:  ?   Cervical: Cervical adenopathy present.  ?Skin: ?   General: Skin is warm.  ?Neurological:  ?   Mental Status: She is alert.   ? ? ? ?UC Treatments / Results  ?Labs ?(all labs ordered are listed, but only abnormal results are displayed) ?Labs Reviewed  ?SARS CORONAVIRUS 2 (TAT 6-24 HRS)  ?CULTURE, GROUP A STREP [facility])  ?POCT RAPID STREP  A, ED / UC  ? ? ?EKG ? ? ?Radiology ?No results found. ? ?Procedures ?Procedures (including critical care time) ? ?Medications Ordered in UC ?Medications - No data to display ? ?Initial Impression / Assessment and Plan / UC Course  ?I have reviewed the triage vital signs and the nursing notes. ? ?Pertinent labs & imaging results that were available during my care of the patient were reviewed by me and considered in my medical decision making (see chart for details). ? ?  ?Final Clinical Impressions(s) / UC Diagnoses  ? ?Final diagnoses:  ?Sore throat  ?Nausea without vomiting  ?Body aches  ? ? ? ?Discharge Instructions   ? ?  ?You were seen today for headache, nausea, and sore throat.  ?Your rapid strep was negative.  This will be sent for culture and you will be notified if positive or needing treatment.  ?We have done a covid test today.  This will be resulted tomorrow.  ?For now, your symptoms appear viral in nature.  I recommend tylenol or motrin for headache and sore throat, as well as salt water gargles.  Please take with food to avoid upset stomach.  ? ? ? ?ED Prescriptions   ?None ?  ? ?PDMP not reviewed this encounter. ?  ?Rondel Oh, MD ?05/11/21 1323 ? ?

## 2021-05-11 NOTE — ED Triage Notes (Signed)
Pt c/o fatigue, sore throat, headache, and lower abdominal pain since Sunday.  ?

## 2021-05-13 LAB — CULTURE, GROUP A STREP (THRC)

## 2021-08-10 DIAGNOSIS — R2231 Localized swelling, mass and lump, right upper limb: Secondary | ICD-10-CM | POA: Diagnosis not present

## 2021-08-10 DIAGNOSIS — R221 Localized swelling, mass and lump, neck: Secondary | ICD-10-CM | POA: Diagnosis not present

## 2021-08-17 DIAGNOSIS — M7989 Other specified soft tissue disorders: Secondary | ICD-10-CM | POA: Diagnosis not present

## 2021-08-17 DIAGNOSIS — R590 Localized enlarged lymph nodes: Secondary | ICD-10-CM | POA: Diagnosis not present

## 2021-08-17 DIAGNOSIS — R221 Localized swelling, mass and lump, neck: Secondary | ICD-10-CM | POA: Diagnosis not present

## 2021-10-30 ENCOUNTER — Encounter: Payer: Self-pay | Admitting: *Deleted

## 2021-10-30 ENCOUNTER — Ambulatory Visit (INDEPENDENT_AMBULATORY_CARE_PROVIDER_SITE_OTHER): Payer: Medicaid Other

## 2021-10-30 ENCOUNTER — Ambulatory Visit
Admission: EM | Admit: 2021-10-30 | Discharge: 2021-10-30 | Disposition: A | Discharge status: Home / Self Care | Payer: Medicaid Other | Attending: Nurse Practitioner | Admitting: Nurse Practitioner

## 2021-10-30 DIAGNOSIS — R079 Chest pain, unspecified: Secondary | ICD-10-CM | POA: Diagnosis not present

## 2021-10-30 DIAGNOSIS — M25561 Pain in right knee: Secondary | ICD-10-CM

## 2021-10-30 DIAGNOSIS — S8001XA Contusion of right knee, initial encounter: Secondary | ICD-10-CM | POA: Diagnosis not present

## 2021-10-30 DIAGNOSIS — S40021A Contusion of right upper arm, initial encounter: Secondary | ICD-10-CM | POA: Diagnosis not present

## 2021-10-30 DIAGNOSIS — S40011A Contusion of right shoulder, initial encounter: Secondary | ICD-10-CM

## 2021-10-30 DIAGNOSIS — T7491XA Unspecified adult maltreatment, confirmed, initial encounter: Secondary | ICD-10-CM | POA: Diagnosis not present

## 2021-10-30 DIAGNOSIS — M25511 Pain in right shoulder: Secondary | ICD-10-CM | POA: Diagnosis not present

## 2021-10-30 DIAGNOSIS — S20213A Contusion of bilateral front wall of thorax, initial encounter: Secondary | ICD-10-CM

## 2021-10-30 DIAGNOSIS — S0011XA Contusion of right eyelid and periocular area, initial encounter: Secondary | ICD-10-CM

## 2021-10-30 DIAGNOSIS — M25571 Pain in right ankle and joints of right foot: Secondary | ICD-10-CM | POA: Diagnosis not present

## 2021-10-30 MED ORDER — IBUPROFEN 800 MG PO TABS: 800.0000 mg | ORAL_TABLET | Freq: Three times a day (TID) | ORAL | 0 refills | Status: DC | PRN

## 2021-10-30 NOTE — Discharge Instructions (Signed)
The x-ray of your right knee, right shoulder, and chest were negative.  There are no obvious fractures or dislocations that were seen on the x-ray. Symptoms are consistent with bruising based on the mechanism of injury. Take medication as prescribed. You may be sore for the next several days.  Recommend warm Epsom salt soaks to help with symptoms. Gentle stretching and range of motion exercises while symptoms persist. Apply ice to help with pain, swelling, and bruising.  Apply for 20 minutes, remove for 1 hour, then repeat as needed.  If her muscles become stiff or sore, recommend the use of heat.  Apply for 20 minutes, remove for 1 hour, then repeat is much as possible. Go to an area that you can ensure that you are safe.  If you do have concerns about your safety, please call 911 immediately. If your symptoms do not improve, please go to the emergency department for further evaluation. Follow-up as needed.
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
## 2022-01-15 ENCOUNTER — Other Ambulatory Visit: Payer: Self-pay

## 2022-01-15 ENCOUNTER — Emergency Department (HOSPITAL_COMMUNITY): Payer: Medicaid Other

## 2022-01-15 ENCOUNTER — Emergency Department (HOSPITAL_COMMUNITY)
Admission: EM | Admit: 2022-01-15 | Discharge: 2022-01-16 | Disposition: A | Discharge status: Home / Self Care | Payer: Medicaid Other | Attending: Emergency Medicine | Admitting: Emergency Medicine

## 2022-01-15 DIAGNOSIS — B9689 Other specified bacterial agents as the cause of diseases classified elsewhere: Secondary | ICD-10-CM | POA: Diagnosis not present

## 2022-01-15 DIAGNOSIS — N83202 Unspecified ovarian cyst, left side: Secondary | ICD-10-CM | POA: Insufficient documentation

## 2022-01-15 DIAGNOSIS — N83201 Unspecified ovarian cyst, right side: Secondary | ICD-10-CM | POA: Insufficient documentation

## 2022-01-15 DIAGNOSIS — N760 Acute vaginitis: Secondary | ICD-10-CM | POA: Diagnosis not present

## 2022-01-15 DIAGNOSIS — R9431 Abnormal electrocardiogram [ECG] [EKG]: Secondary | ICD-10-CM | POA: Diagnosis not present

## 2022-01-15 DIAGNOSIS — R1031 Right lower quadrant pain: Secondary | ICD-10-CM | POA: Diagnosis not present

## 2022-01-15 DIAGNOSIS — N83299 Other ovarian cyst, unspecified side: Secondary | ICD-10-CM

## 2022-01-15 LAB — CBC WITH DIFFERENTIAL/PLATELET
Abs Immature Granulocytes: 0.04 10*3/uL (ref 0.00–0.07)
Basophils Absolute: 0.1 10*3/uL (ref 0.0–0.1)
Basophils Relative: 1 %
Eosinophils Absolute: 0 10*3/uL (ref 0.0–0.5)
Eosinophils Relative: 0 %
HCT: 42.4 % (ref 36.0–46.0)
Hemoglobin: 14.2 g/dL (ref 12.0–15.0)
Immature Granulocytes: 0 %
Lymphocytes Relative: 35 %
Lymphs Abs: 3.5 10*3/uL (ref 0.7–4.0)
MCH: 28.4 pg (ref 26.0–34.0)
MCHC: 33.5 g/dL (ref 30.0–36.0)
MCV: 84.8 fL (ref 80.0–100.0)
Monocytes Absolute: 1 10*3/uL (ref 0.1–1.0)
Monocytes Relative: 10 %
Neutro Abs: 5.3 10*3/uL (ref 1.7–7.7)
Neutrophils Relative %: 54 %
Platelets: 374 10*3/uL (ref 150–400)
RBC: 5 MIL/uL (ref 3.87–5.11)
RDW: 12.9 % (ref 11.5–15.5)
WBC: 10 10*3/uL (ref 4.0–10.5)
nRBC: 0 % (ref 0.0–0.2)

## 2022-01-15 LAB — I-STAT BETA HCG BLOOD, ED (MC, WL, AP ONLY): I-stat hCG, quantitative: <5 m[IU]/mL (ref <5)

## 2022-01-15 LAB — COMPREHENSIVE METABOLIC PANEL
ALT: 24 U/L (ref 0–44)
AST: 33 U/L (ref 15–41)
Albumin: 4.3 g/dL (ref 3.5–5.0)
Alkaline Phosphatase: 51 U/L (ref 38–126)
Anion gap: 14 (ref 5–15)
BUN: 11 mg/dL (ref 6–20)
CO2: 19 mmol/L — ABNORMAL LOW (ref 22–32)
Calcium: 10 mg/dL (ref 8.9–10.3)
Chloride: 103 mmol/L (ref 98–111)
Creatinine, Ser: 0.78 mg/dL (ref 0.44–1.00)
GFR, Estimated: >60 mL/min (ref >60)
Glucose, Bld: 81 mg/dL (ref 70–99)
Potassium: 3.3 mmol/L — ABNORMAL LOW (ref 3.5–5.1)
Sodium: 136 mmol/L (ref 135–145)
Total Bilirubin: 1.1 mg/dL (ref 0.3–1.2)
Total Protein: 7.6 g/dL (ref 6.5–8.1)

## 2022-01-15 LAB — URINALYSIS, ROUTINE W REFLEX MICROSCOPIC
Bilirubin Urine: NEGATIVE
Glucose, UA: NEGATIVE mg/dL
Ketones, ur: NEGATIVE mg/dL
Nitrite: NEGATIVE
Protein, ur: NEGATIVE mg/dL
Specific Gravity, Urine: <1.005 — ABNORMAL LOW (ref 1.005–1.030)
pH: 7 (ref 5.0–8.0)

## 2022-01-15 LAB — LIPASE, BLOOD: Lipase: 32 U/L (ref 11–51)

## 2022-01-15 LAB — URINALYSIS, MICROSCOPIC (REFLEX)
Bacteria, UA: NONE SEEN
RBC / HPF: NONE SEEN RBC/hpf (ref 0–5)
Squamous Epithelial / HPF: NONE SEEN /HPF (ref 0–5)

## 2022-01-15 MED ORDER — SODIUM CHLORIDE 0.9 % IV BOLUS
1000.0000 mL | Freq: Once | INTRAVENOUS | Status: AC
  Administered 2022-01-15: 1000 mL via INTRAVENOUS

## 2022-01-15 MED ORDER — IOHEXOL 300 MG/ML  SOLN
100.0000 mL | Freq: Once | INTRAMUSCULAR | Status: AC | PRN
  Administered 2022-01-15: 100 mL via INTRAVENOUS

## 2022-01-15 MED ORDER — HYDROMORPHONE HCL 1 MG/ML IJ SOLN
1.0000 mg | Freq: Once | INTRAMUSCULAR | Status: AC
  Administered 2022-01-15: 1 mg via INTRAVENOUS
  Filled 2022-01-15: qty 1

## 2022-01-15 MED ORDER — ONDANSETRON 8 MG PO TBDP
8.0000 mg | ORAL_TABLET | Freq: Once | ORAL | Status: AC
  Administered 2022-01-15: 8 mg via ORAL
  Filled 2022-01-15: qty 1

## 2022-01-15 MED ORDER — OXYCODONE-ACETAMINOPHEN 5-325 MG PO TABS
1.0000 | ORAL_TABLET | Freq: Once | ORAL | Status: AC
  Administered 2022-01-15: 1 via ORAL
  Filled 2022-01-15: qty 1

## 2022-01-15 NOTE — ED Provider Notes (Signed)
[facility]-EMERGENCY DEPT Provider Note   CSN: [id number] Arrival date & time: 01/15/22  2037     History  Chief Complaint  Patient presents with   Abdominal Pain    Jordan Hanson is a 27 y.o. female.  HPI   Medical history including bipolar, depression, anxiety presenting with complaints of sudden onset of right lower quadrant pain.  Pain started approximately 2 hours ago, states pain is in her umbilical region and then migrated to right lower quadrant, states it is a stabbing-like sensation, has remained constant, associated nausea vomiting, she still passing gas having no bowel movements, last bowel movement today, she is not endorsing any urinary symptoms, she states she is not on birth control and she is not pregnant, last menstrual cycle was 1 week ago.  She has no abdominal surgeries, she denies excessive alcohol use NSAID use, she has no associate fever chills cough congestion.    Home Medications Prior to Admission medications   Medication Sig Start Date End Date Taking? Authorizing Provider  metroNIDAZOLE (FLAGYL) 500 MG tablet Take 1 tablet (500 mg total) by mouth 2 (two) times daily for 7 days. 01/16/22 01/23/22 Yes Carroll Sage, PA-C  acetaminophen (TYLENOL) 500 MG tablet Take 2 tablets (1,000 mg total) by mouth every 6 (six) hours as needed for mild pain or moderate pain. Do NOT take if you have taken APAP-Pamabrom-Pyrilamine 08/01/20   Eric Form, PA-C  APAP-Pamabrom-Pyrilamine 500-25-15 MG TABS Take 1 tablet by mouth daily as needed (menstrual cramps). Midol Complete Caffeine Free    [provider]  dicyclomine (BENTYL) 20 MG tablet Take 1 tablet (20 mg total) by mouth 2 (two) times daily. 01/17/21   Coralyn Mark, NP  gabapentin (NEURONTIN) 300 MG capsule Take 1 capsule (300 mg total) by mouth 3 (three) times daily. 08/01/20   Eric Form, PA-C  ibuprofen (ADVIL) 800 MG tablet Take 1 tablet (800 mg total) by mouth every  8 (eight) hours as needed. 10/30/21   Leath-Warren, Sadie Haber, NP  lidocaine (LIDODERM) 5 % Place 1 patch onto the skin daily. Remove & Discard patch within 12 hours or as directed by MD 08/01/20   Eric Form, PA-C  lurasidone (LATUDA) 40 MG TABS tablet Take 1 tablet (40 mg total) by mouth daily with breakfast. 01/25/21   Shanna Cisco, NP  Multiple Vitamin (MULTIVITAMIN WITH MINERALS) TABS tablet Take 1 tablet by mouth daily with supper.    [provider]  naproxen (NAPROSYN) 500 MG tablet Take 1 tablet (500 mg total) by mouth 2 (two) times daily. 01/31/21   Domenick Gong, MD  sertraline (ZOLOFT) 50 MG tablet Take 1 tablet (50 mg total) by mouth daily. 01/25/21   Shanna Cisco, NP  traMADol-acetaminophen (ULTRACET) 37.5-325 MG tablet Take 1 tablet by mouth 4 (four) times daily as needed (pain).    [provider]  traZODone (DESYREL) 50 MG tablet Take 1 tablet (50 mg total) by mouth at bedtime. 01/25/21   Shanna Cisco, NP  cetirizine (ZYRTEC ALLERGY) 10 MG tablet Take 1 tablet (10 mg total) by mouth daily. Patient not taking: Reported on 07/30/2020 12/17/19 08/01/20  Particia Nearing, PA-C  fluticasone [facility]) 50 MCG/ACT nasal spray Place 1 spray into both nostrils daily. Patient not taking: Reported on 07/30/2020 12/17/19 08/01/20  Particia Nearing, PA-C      Allergies    Geodon [ziprasidone hcl], Penicillins, and Tamsulosin    Review of Systems  Review of Systems  Constitutional:  Negative for chills and fever.  Respiratory:  Negative for shortness of breath.   Cardiovascular:  Negative for chest pain.  Gastrointestinal:  Positive for abdominal pain and nausea. Negative for diarrhea and vomiting.  Neurological:  Negative for headaches.    Physical Exam Updated Vital Signs BP 111/66   Pulse 72   Temp 98.4 F (36.9 C)   Resp 17   Ht 5\' 3"  (1.6 m)   Wt 63 kg   LMP 01/09/2022 (Approximate)   SpO2 96%   BMI 24.62 kg/m   Physical Exam Vitals and nursing note reviewed.  Constitutional:      General: She is in acute distress.     Appearance: She is not ill-appearing.  HENT:     Head: Normocephalic and atraumatic.     Nose: No congestion.  Eyes:     Conjunctiva/sclera: Conjunctivae normal.  Cardiovascular:     Rate and Rhythm: Normal rate and regular rhythm.     Pulses: Normal pulses.     Heart sounds: No murmur heard.    No friction rub. No gallop.  Pulmonary:     Effort: No respiratory distress.     Breath sounds: No wheezing, rhonchi or rales.  Abdominal:     Palpations: Abdomen is soft.     Tenderness: There is abdominal tenderness. There is guarding. There is no right CVA tenderness or left CVA tenderness.     Comments: Abdomen nondistended, patient has noted right lower quadrant tenderness, she was guarding, there is no rebound tenderness or peritoneal signs she has no flank tenderness.  Skin:    General: Skin is warm and dry.  Neurological:     Mental Status: She is alert.  Psychiatric:        Mood and Affect: Mood normal.     ED Results / Procedures / Treatments   Labs (all labs ordered are listed, but only abnormal results are displayed) Labs Reviewed  WET PREP, GENITAL - Abnormal; Notable for the following components:      Result Value   Clue Cells Wet Prep HPF POC PRESENT (*)    All other components within normal limits  COMPREHENSIVE METABOLIC PANEL - Abnormal; Notable for the following components:   Potassium 3.3 (*)    CO2 19 (*)    All other components within normal limits  URINALYSIS, ROUTINE W REFLEX MICROSCOPIC - Abnormal; Notable for the following components:   Specific Gravity, Urine <1.005 (*)    Hgb urine dipstick SMALL (*)    Leukocytes,Ua SMALL (*)    All other components within normal limits  CBC WITH DIFFERENTIAL/PLATELET  LIPASE, BLOOD  URINALYSIS, MICROSCOPIC (REFLEX)  I-STAT BETA HCG BLOOD, ED ([facility], [facility], [facility] ONLY)  GC/CHLAMYDIA PROBE AMP ([place]) NOT  AT [facility]    EKG None  Radiology US Pelvis Complete  Result Date: 01/16/2022 CLINICAL DATA:  Right-sided pelvic pain. EXAM: TRANSABDOMINAL AND TRANSVAGINAL ULTRASOUND OF PELVIS DOPPLER ULTRASOUND OF OVARIES TECHNIQUE: Both transabdominal and transvaginal ultrasound examinations of the pelvis were performed. Transabdominal technique was performed for global imaging of the pelvis including uterus, ovaries, adnexal regions, and pelvic cul-de-sac. It was necessary to proceed with endovaginal exam following the transabdominal exam to visualize the uterus, endometrium, bilateral ovaries and bilateral adnexa. Color and duplex Doppler ultrasound was utilized to evaluate blood flow to the ovaries. COMPARISON:  None Available. FINDINGS: Uterus Measurements: 7.9 cm x 3.6 cm x 5.0 cm = volume: 75.6 mL. No fibroids or other  mass visualized. Endometrium Thickness: 7.9 mm.  No focal abnormality visualized. Right ovary Measurements: 3.7 cm x 2.8 cm x 2.7 cm = volume: 15.0 mL. A 2.4 cm x 1.7 cm x 1.8 cm right ovarian cyst is noted. Left ovary Measurements: 2.0 cm x 1.9 cm x 2.4 cm = volume: 6.9 mL. Normal appearance/no adnexal mass. Pulsed Doppler evaluation of both ovaries demonstrates normal low-resistance arterial and venous waveforms. Other findings A trace amount of pelvic free fluid is noted. IMPRESSION: 2.4 cm x 1.7 cm x 1.8 cm right ovarian cyst. This is almost certainly benign, but follow up ultrasound is recommended in 1 year according to the Society of Radiologists in Ultrasound2010 Consensus Conference Statement Grier Rocher et al. Management of Asymptomatic Ovarian and Other Adnexal Cysts Imaged at [place]: Society of Radiologists in Ultrasound Consensus Conference Statement 2010. Radiology 256 (Sept 2010): 943-954.). Electronically Signed   By: Aram Candela M.D.   On: 01/16/2022 02:52   US Transvaginal Non-OB  Result Date: 01/16/2022 CLINICAL DATA:  Right-sided pelvic pain. EXAM: TRANSABDOMINAL AND TRANSVAGINAL  ULTRASOUND OF PELVIS DOPPLER ULTRASOUND OF OVARIES TECHNIQUE: Both transabdominal and transvaginal ultrasound examinations of the pelvis were performed. Transabdominal technique was performed for global imaging of the pelvis including uterus, ovaries, adnexal regions, and pelvic cul-de-sac. It was necessary to proceed with endovaginal exam following the transabdominal exam to visualize the uterus, endometrium, bilateral ovaries and bilateral adnexa. Color and duplex Doppler ultrasound was utilized to evaluate blood flow to the ovaries. COMPARISON:  None Available. FINDINGS: Uterus Measurements: 7.9 cm x 3.6 cm x 5.0 cm = volume: 75.6 mL. No fibroids or other mass visualized. Endometrium Thickness: 7.9 mm.  No focal abnormality visualized. Right ovary Measurements: 3.7 cm x 2.8 cm x 2.7 cm = volume: 15.0 mL. A 2.4 cm x 1.7 cm x 1.8 cm right ovarian cyst is noted. Left ovary Measurements: 2.0 cm x 1.9 cm x 2.4 cm = volume: 6.9 mL. Normal appearance/no adnexal mass. Pulsed Doppler evaluation of both ovaries demonstrates normal low-resistance arterial and venous waveforms. Other findings A trace amount of pelvic free fluid is noted. IMPRESSION: 2.4 cm x 1.7 cm x 1.8 cm right ovarian cyst. This is almost certainly benign, but follow up ultrasound is recommended in 1 year according to the Society of Radiologists in Ultrasound2010 Consensus Conference Statement Grier Rocher et al. Management of Asymptomatic Ovarian and Other Adnexal Cysts Imaged at [place]: Society of Radiologists in Ultrasound Consensus Conference Statement 2010. Radiology 256 (Sept 2010): [phone].). Electronically Signed   By: Aram Candela M.D.   On: 01/16/2022 02:52   [place] Art/Ven Flow Abd Pelv Doppler  Result Date: 01/16/2022 CLINICAL DATA:  Right-sided pelvic pain. EXAM: TRANSABDOMINAL AND TRANSVAGINAL ULTRASOUND OF PELVIS DOPPLER ULTRASOUND OF OVARIES TECHNIQUE: Both transabdominal and transvaginal ultrasound examinations of the pelvis were performed.  Transabdominal technique was performed for global imaging of the pelvis including uterus, ovaries, adnexal regions, and pelvic cul-de-sac. It was necessary to proceed with endovaginal exam following the transabdominal exam to visualize the uterus, endometrium, bilateral ovaries and bilateral adnexa. Color and duplex Doppler ultrasound was utilized to evaluate blood flow to the ovaries. COMPARISON:  None Available. FINDINGS: Uterus Measurements: 7.9 cm x 3.6 cm x 5.0 cm = volume: 75.6 mL. No fibroids or other mass visualized. Endometrium Thickness: 7.9 mm.  No focal abnormality visualized. Right ovary Measurements: 3.7 cm x 2.8 cm x 2.7 cm = volume: 15.0 mL. A 2.4 cm x 1.7 cm x 1.8 cm right ovarian cyst is  noted. Left ovary Measurements: 2.0 cm x 1.9 cm x 2.4 cm = volume: 6.9 mL. Normal appearance/no adnexal mass. Pulsed Doppler evaluation of both ovaries demonstrates normal low-resistance arterial and venous waveforms. Other findings A trace amount of pelvic free fluid is noted. IMPRESSION: 2.4 cm x 1.7 cm x 1.8 cm right ovarian cyst. This is almost certainly benign, but follow up ultrasound is recommended in 1 year according to the Society of Radiologists in Ultrasound2010 Consensus Conference Statement Grier Rocher et al. Management of Asymptomatic Ovarian and Other Adnexal Cysts Imaged at [place]: Society of Radiologists in Ultrasound Consensus Conference Statement 2010. Radiology 256 (Sept 2010): [phone].). Electronically Signed   By: Aram Candela M.D.   On: 01/16/2022 02:52   CT Abdomen Pelvis W Contrast  Result Date: 01/15/2022 CLINICAL DATA:  Right lower quadrant pain EXAM: CT ABDOMEN AND PELVIS WITH CONTRAST TECHNIQUE: Multidetector CT imaging of the abdomen and pelvis was performed using the standard protocol following bolus administration of intravenous contrast. RADIATION DOSE REDUCTION: This exam was performed according to the departmental dose-optimization program which includes automated exposure  control, adjustment of the mA and/or kV according to patient size and/or use of iterative reconstruction technique. CONTRAST:  OMNIPAQUE IOHEXOL 300 MG/ML  SOLN COMPARISON:  CT 04/21/2019 FINDINGS: Lower chest: No acute abnormality. Hepatobiliary: No focal liver abnormality is seen. No gallstones, gallbladder wall thickening, or biliary dilatation. Pancreas: Unremarkable. No pancreatic ductal dilatation or surrounding inflammatory changes. Spleen: Normal in size without focal abnormality. Adrenals/Urinary Tract: Adrenal glands are unremarkable. Kidneys are normal, without renal calculi, focal lesion, or hydronephrosis. Bladder is unremarkable. Stomach/Bowel: Stomach is within normal limits. Appendix appears normal. No evidence of bowel wall thickening, distention, or inflammatory changes. Vascular/Lymphatic: No significant vascular findings are present. No enlarged abdominal or pelvic lymph nodes. Reproductive: Uterus and bilateral adnexa are unremarkable. Other: No abdominal wall hernia or abnormality. No abdominopelvic ascites. 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 No CT evidence of acute intra-abdominal or pelvic abnormality. Normal appendix. 2. 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 Electronically Signed   By: Jasmine Pang M.D.   On: 01/15/2022 23:16    Procedures Procedures    Medications Ordered in ED Medications  ketorolac (TORADOL) 15 MG/ML injection 15 mg (has no administration in time range)  ondansetron (ZOFRAN-ODT) disintegrating tablet 8 mg (8 mg Oral Given 01/15/22 2200)  oxyCODONE-acetaminophen (PERCOCET/ROXICET) 5-325 MG per tablet 1 tablet (1 tablet Oral Given 01/15/22 2144)  iohexol (OMNIPAQUE) 300 MG/ML solution 100 mL (100 mLs Intravenous Contrast Given 01/15/22 2247)  sodium chloride 0.9 % bolus 1,000 mL (0 mLs Intravenous Stopped 01/16/22 0015)  HYDROmorphone (DILAUDID) injection  1 mg (1 mg Intravenous Given 01/15/22 2333)  HYDROmorphone (DILAUDID) injection 0.5 mg (0.5 mg Intravenous Given 01/16/22 0310)    ED Course/ Medical Decision Making/ A&P                           Medical Decision Making Amount and/or Complexity of Data Reviewed Labs: ordered. Radiology: ordered.  Risk Prescription drug management.   This patient presents to the ED for concern of abdominal pain, this involves an extensive number of treatment options, and is a complaint that carries with it a high risk of complications and morbidity.  The differential diagnosis includes ovarian torsion, ectopic pregnancy, appendicitis, diverticulitis, PID, kidney stone    Additional history obtained:  Additional  history obtained from N/A External records from outside source obtained and reviewed including recent ER notes   Co morbidities that complicate the patient evaluation  Psych disorder  Social Determinants of Health:  No PCP    Lab Tests:  I Ordered, and personally interpreted labs.  The pertinent results include: CBC is unremarkable, CMP shows potassium 3.3, CO2 of 19, UA shows small leukocytes, hCG less than 5   Imaging Studies ordered:  I ordered imaging studies including CT AP I independently visualized and interpreted imaging which showed negative acute findings, ultrasound reveals 2.4 x 1.7 x 1.8 cm right ovarian cyst, benign, recommend follow-up in 1 year I agree with the radiologist interpretation   Cardiac Monitoring:  The patient was maintained on a cardiac monitor.  I personally viewed and interpreted the cardiac monitored which showed an underlying rhythm of: EKG without signs of ischemia   Medicines ordered and prescription drug management:  I ordered medication including fluid, pain medication I have reviewed the patients home medicines and have made adjustments as needed  Critical Interventions:  N/A   Reevaluation:  Presenting with abdominal pain, she  is significantly tender on my exam, triage obtain basic lab work which I appreciated they are unremarkable, CT AP is pending will continue to monitor.  CT imaging is negative acute findings, on reassessment patient still endorsing significant mount of pain, tenderness is still mainly in the lower pelvic region, she is not nursing any vaginal discharge vaginal bleeding she has no history of ovarian torsion's or cyst, she does endorse that she is sexually active, I am concerned that she might have a torsed ovary, will send down for ultrasound for further evaluation.  Reassessed patient resting comfortably, agreement with discharge at this time   Consultations Obtained:  N/A    Test Considered:  N/A    Rule out Suspicion for ectopic pregnancy is low at this time, urine pregnancy negative.  I doubt ovarian torsion as presentation is atypical, she had ultrasound which was negative or signs of torsion.  I doubt PID she is denies vaginal discharge/ vaginal bleeding.  My suspicion for UTI [place] or kidney stone is aslo low she does not endorse any urinary symptoms UA is negative signs of infection or hematuria.  I doubt bowel obstruction, diverticulitis, pancreatitis, appendicitis, AAA CT imaging all negative these findings.      Dispostion and problem list  After consideration of the diagnostic results and the patients response to treatment, I feel that the patent would benefit from discharge.  Ovarian cysts-patient was made aware of these findings, recommend that she follows up with her OB/GYN within a years time for reassessment, recommend NSAIDs given strict return precautions BV-patient was made aware of this, will start on Flagyl follow-up with OB/GYN for further evaluation and strict return precautions            Final Clinical Impression(s) / ED Diagnoses Final diagnoses:  Functional ovarian cysts  BV (bacterial vaginosis)    Rx / DC Orders ED Discharge Orders           Ordered    metroNIDAZOLE (FLAGYL) 500 MG tablet  2 times daily        01/16/22 0351              Carroll Sage, PA-C 01/16/22 0352    Mardene Sayer, MD 01/16/22 [phone]

## 2022-01-15 NOTE — ED Provider Triage Note (Signed)
Emergency Medicine Provider Triage Evaluation Note  Jordan Hanson , a 27 y.o. female  was evaluated in triage.  Pt complains of right lower quadrant pain started an hour ago.  Started periumbilically radiating down.  Is constant, associated with nausea but no vomiting.  Tearful.  No previous abdominal surgeries.    Review of Systems  Positive:  Negative:   Physical Exam  BP (!) 140/96   Pulse 81   Temp 98.4 F (36.9 C) (Oral)   Resp 20   Ht 5\' 3"  (1.6 m)   Wt 63 kg   LMP 01/09/2022 (Approximate)   SpO2 97%   BMI 24.62 kg/m  Gen:   Awake, no distress. Tearful  Resp:  Normal effort  MSK:   Moves extremities without difficulty  Other:  Tachycardic. RLQ pain  Medical Decision Making  Medically screening exam initiated at 9:14 PM.  Appropriate orders placed.  ACELYN BASHAM was informed that the remainder of the evaluation will be completed by another provider, this initial triage assessment does not replace that evaluation, and the importance of remaining in the ED until their evaluation is complete.     Sherrill Raring, PA-C 01/15/22 2115

## 2022-01-15 NOTE — ED Triage Notes (Signed)
Pt arrives with reports of sudden onset abdominal pain that began 1 hour ago. Pt reports middle to RLQ abdominal pain. Pt denies other symptoms.

## 2022-01-16 ENCOUNTER — Emergency Department (HOSPITAL_COMMUNITY): Payer: Medicaid Other

## 2022-01-16 DIAGNOSIS — N83201 Unspecified ovarian cyst, right side: Secondary | ICD-10-CM | POA: Diagnosis not present

## 2022-01-16 LAB — GC/CHLAMYDIA PROBE AMP (~~LOC~~) NOT AT ARMC
Chlamydia: NEGATIVE
Comment: NEGATIVE
Comment: NORMAL
Neisseria Gonorrhea: NEGATIVE

## 2022-01-16 LAB — WET PREP, GENITAL
Sperm: NONE SEEN
Trich, Wet Prep: NONE SEEN
WBC, Wet Prep HPF POC: <10 (ref <10)
Yeast Wet Prep HPF POC: NONE SEEN

## 2022-01-16 MED ORDER — HYDROMORPHONE HCL 1 MG/ML IJ SOLN
0.5000 mg | Freq: Once | INTRAMUSCULAR | Status: AC
  Administered 2022-01-16: 0.5 mg via INTRAVENOUS
  Filled 2022-01-16: qty 1

## 2022-01-16 MED ORDER — KETOROLAC TROMETHAMINE 15 MG/ML IJ SOLN
15.0000 mg | Freq: Once | INTRAMUSCULAR | Status: AC
  Administered 2022-01-16: 15 mg via INTRAVENOUS
  Filled 2022-01-16: qty 1

## 2022-01-16 MED ORDER — METRONIDAZOLE 500 MG PO TABS: 500.0000 mg | ORAL_TABLET | Freq: Two times a day (BID) | ORAL | 0 refills | Status: AC

## 2022-01-16 NOTE — Discharge Instructions (Signed)
Your right lower abdominal pain is likely from a ovarian cyst, I recommend NSAIDs as needed for pain control.  I would like you to follow-up with your OB/GYN within a years time for reassessment.  BV-this is an overgrowth of the normal bacteria found in your vaginal canal this is not an STD, I have started you on antibiotics please take as prescribed do not drink alcohol take this medication please follow-up with your OB for reassessment.  Come back to the emergency department if you develop chest pain, shortness of breath, severe abdominal pain, uncontrolled nausea, vomiting, diarrhea.

## 2022-02-10 ENCOUNTER — Emergency Department (HOSPITAL_COMMUNITY)
Admission: EM | Admit: 2022-02-10 | Discharge: 2022-02-10 | Disposition: A | Discharge status: Home / Self Care | Payer: Medicaid Other | Attending: Emergency Medicine | Admitting: Emergency Medicine

## 2022-02-10 ENCOUNTER — Other Ambulatory Visit: Payer: Self-pay

## 2022-02-10 ENCOUNTER — Emergency Department (HOSPITAL_COMMUNITY): Payer: No Typology Code available for payment source

## 2022-02-10 ENCOUNTER — Emergency Department (HOSPITAL_COMMUNITY): Payer: Medicaid Other

## 2022-02-10 ENCOUNTER — Encounter (HOSPITAL_COMMUNITY): Payer: Self-pay

## 2022-02-10 DIAGNOSIS — S8002XA Contusion of left knee, initial encounter: Secondary | ICD-10-CM | POA: Insufficient documentation

## 2022-02-10 DIAGNOSIS — S20212A Contusion of left front wall of thorax, initial encounter: Secondary | ICD-10-CM | POA: Insufficient documentation

## 2022-02-10 DIAGNOSIS — R109 Unspecified abdominal pain: Secondary | ICD-10-CM | POA: Diagnosis present

## 2022-02-10 DIAGNOSIS — Y92410 Unspecified street and highway as the place of occurrence of the external cause: Secondary | ICD-10-CM | POA: Insufficient documentation

## 2022-02-10 DIAGNOSIS — M25562 Pain in left knee: Secondary | ICD-10-CM | POA: Diagnosis not present

## 2022-02-10 DIAGNOSIS — S301XXA Contusion of abdominal wall, initial encounter: Secondary | ICD-10-CM | POA: Diagnosis not present

## 2022-02-10 DIAGNOSIS — S300XXA Contusion of lower back and pelvis, initial encounter: Secondary | ICD-10-CM

## 2022-02-10 DIAGNOSIS — M7989 Other specified soft tissue disorders: Secondary | ICD-10-CM | POA: Diagnosis not present

## 2022-02-10 DIAGNOSIS — J439 Emphysema, unspecified: Secondary | ICD-10-CM | POA: Diagnosis not present

## 2022-02-10 LAB — COMPREHENSIVE METABOLIC PANEL
ALT: 18 U/L (ref 0–44)
AST: 25 U/L (ref 15–41)
Albumin: 4.3 g/dL (ref 3.5–5.0)
Alkaline Phosphatase: 52 U/L (ref 38–126)
Anion gap: 11 (ref 5–15)
BUN: 13 mg/dL (ref 6–20)
CO2: 22 mmol/L (ref 22–32)
Calcium: 9.3 mg/dL (ref 8.9–10.3)
Chloride: 106 mmol/L (ref 98–111)
Creatinine, Ser: 0.72 mg/dL (ref 0.44–1.00)
GFR, Estimated: >60 mL/min (ref >60)
Glucose, Bld: 76 mg/dL (ref 70–99)
Potassium: 3.8 mmol/L (ref 3.5–5.1)
Sodium: 139 mmol/L (ref 135–145)
Total Bilirubin: 0.6 mg/dL (ref 0.3–1.2)
Total Protein: 6.9 g/dL (ref 6.5–8.1)

## 2022-02-10 LAB — CBC
HCT: 41.3 % (ref 36.0–46.0)
Hemoglobin: 13.2 g/dL (ref 12.0–15.0)
MCH: 28.1 pg (ref 26.0–34.0)
MCHC: 32 g/dL (ref 30.0–36.0)
MCV: 87.9 fL (ref 80.0–100.0)
Platelets: 307 10*3/uL (ref 150–400)
RBC: 4.7 MIL/uL (ref 3.87–5.11)
RDW: 13.3 % (ref 11.5–15.5)
WBC: 9.3 10*3/uL (ref 4.0–10.5)
nRBC: 0 % (ref 0.0–0.2)

## 2022-02-10 LAB — HCG, SERUM, QUALITATIVE: Preg, Serum: NEGATIVE

## 2022-02-10 MED ORDER — KETOROLAC TROMETHAMINE 15 MG/ML IJ SOLN
15.0000 mg | Freq: Once | INTRAMUSCULAR | Status: AC
  Administered 2022-02-10: 15 mg via INTRAVENOUS
  Filled 2022-02-10: qty 1

## 2022-02-10 MED ORDER — IOHEXOL 300 MG/ML  SOLN: 75.0000 mL | Freq: Once | INTRAMUSCULAR | Status: DC | PRN

## 2022-02-10 MED ORDER — MORPHINE SULFATE (PF) 4 MG/ML IV SOLN
4.0000 mg | Freq: Once | INTRAVENOUS | Status: AC
  Administered 2022-02-10: 4 mg via INTRAVENOUS
  Filled 2022-02-10: qty 1

## 2022-02-10 MED ORDER — NAPROXEN 500 MG PO TABS: 500.0000 mg | ORAL_TABLET | Freq: Two times a day (BID) | ORAL | 0 refills | Status: DC

## 2022-02-10 MED ORDER — IOHEXOL 300 MG/ML  SOLN
100.0000 mL | Freq: Once | INTRAMUSCULAR | Status: AC | PRN
  Administered 2022-02-10: 100 mL via INTRAVENOUS
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
## 2022-05-03 DIAGNOSIS — S8002XA Contusion of left knee, initial encounter: Secondary | ICD-10-CM | POA: Diagnosis not present

## 2022-05-03 DIAGNOSIS — M25562 Pain in left knee: Secondary | ICD-10-CM | POA: Diagnosis not present

## 2022-05-03 DIAGNOSIS — S8992XA Unspecified injury of left lower leg, initial encounter: Secondary | ICD-10-CM | POA: Diagnosis not present

## 2022-05-05 DIAGNOSIS — M25562 Pain in left knee: Secondary | ICD-10-CM | POA: Diagnosis not present

## 2022-08-09 ENCOUNTER — Ambulatory Visit (INDEPENDENT_AMBULATORY_CARE_PROVIDER_SITE_OTHER): Payer: Medicaid Other | Admitting: Family Medicine

## 2022-08-09 VITALS — BP 115/67 | HR 95 | Ht 65.0 in | Wt 153.4 lb

## 2022-08-09 DIAGNOSIS — Z3201 Encounter for pregnancy test, result positive: Secondary | ICD-10-CM | POA: Diagnosis not present

## 2022-08-09 DIAGNOSIS — Z3687 Encounter for antenatal screening for uncertain dates: Secondary | ICD-10-CM

## 2022-08-09 LAB — POCT PREGNANCY, URINE: Preg Test, Ur: POSITIVE — AB

## 2022-08-09 NOTE — Patient Instructions (Signed)
[facility] Prenatal Care Providers          [facility] locations:  Hours may vary. Please call for an appointment   [facility]                                                             [address], [address], [place], [place], [address] [phone]  [facility]                                    [address], [address], [place], [place], [address] [phone]  [facility] [place] [address], [place], [place], [address] [phone]  [facility]                                 [address], [place], [place], [address] [phone]  [facility]                                    [address], [place], [place], [address] [phone]  [facility] [address], [place], [place], [address]         Safe Medications in Pregnancy   Acne:  Benzoyl Peroxide  Salicylic Acid   Backache/Headache:  Tylenol: 2 regular strength every 4 hours OR               2 Extra strength every 6 hours   Colds/Coughs/Allergies:  Benadryl (alcohol free) 25 mg every 6 hours as needed  Breath right strips  Claritin  Cepacol throat lozenges  Chloraseptic throat spray  Cold-Eeze- up to three times per day  Cough drops, alcohol free  Flonase (by prescription only)  Guaifenesin  Mucinex  Robitussin DM (plain only, alcohol free)  Saline nasal spray/drops  Sudafed (pseudoephedrine) & Actifed * use only after [redacted] weeks gestation and if you do not have high blood pressure  Tylenol  Vicks Vaporub  Zinc lozenges  Zyrtec   Constipation:  Colace  Ducolax suppositories  Fleet enema  Glycerin suppositories  Metamucil  Milk of magnesia  Miralax  Senokot  Smooth move tea   Diarrhea:  Kaopectate  Imodium A-D   *NO pepto Bismol    Hemorrhoids:  Anusol  Anusol HC  Preparation H  Tucks   Indigestion:  Tums  Maalox  Mylanta  Zantac  Pepcid   Insomnia:  Benadryl (alcohol free) 25mg every 6 hours as needed  Tylenol PM  Unisom, no Gelcaps   Leg Cramps:  Tums  MagGel   Nausea/Vomiting:  Bonine  Dramamine  Emetrol  Ginger extract  Sea bands  Meclizine  Nausea medication to take during pregnancy:  Unisom (doxylamine succinate 25 mg tablets) Take one tablet daily at bedtime. If symptoms are not adequately controlled, the dose can be increased to a maximum recommended dose of two tablets daily (1/2 tablet in the morning, 1/2 tablet mid-afternoon and   one at bedtime).  Vitamin B6 100mg tablets. Take one tablet twice a day (up to 200 mg per day).   Skin Rashes:  Aveeno products  Benadryl cream or 25mg every 6 hours as needed  Calamine Lotion  1% cortisone cream   Yeast infection:  Gyne-lotrimin 7  Monistat 7    **If taking multiple medications, please check labels to avoid duplicating the same active ingredients  **take medication as directed on the label  ** Do not exceed 4000 mg of tylenol in 24 hours  **Do not take medications that contain aspirin or ibuprofen                                   

## 2022-08-09 NOTE — Progress Notes (Signed)
Pt here today for pregnancy test resulting positive.  Pt reports hx of bipolar/anxiety/depression and  medication was last taken about 21/2 years ago.  Medications/allergies reviewed.  Pt reports LMP around about  06/13/22.  Pt states that she does not keep track of her periods and she has irregular periods.  Pt scheduled for dating U/S on 08/13/22 at 0845.  Pt provided with list of medications safe to take in pregnancy, [facility] office providers, and advised when to go to MAU for evaluation.  Pt verbalized understanding with no further questions.   Leonette Nutting  08/09/22

## 2022-08-10 ENCOUNTER — Inpatient Hospital Stay (HOSPITAL_COMMUNITY)
Admission: AD | Admit: 2022-08-10 | Discharge: 2022-08-10 | Disposition: A | Discharge status: Home / Self Care | Payer: Medicaid Other | Attending: Obstetrics and Gynecology | Admitting: Obstetrics and Gynecology

## 2022-08-10 ENCOUNTER — Encounter (HOSPITAL_COMMUNITY): Payer: Self-pay | Admitting: Obstetrics and Gynecology

## 2022-08-10 ENCOUNTER — Inpatient Hospital Stay (HOSPITAL_COMMUNITY): Payer: Medicaid Other

## 2022-08-10 DIAGNOSIS — F17210 Nicotine dependence, cigarettes, uncomplicated: Secondary | ICD-10-CM | POA: Diagnosis not present

## 2022-08-10 DIAGNOSIS — O26891 Other specified pregnancy related conditions, first trimester: Secondary | ICD-10-CM | POA: Diagnosis not present

## 2022-08-10 DIAGNOSIS — O99891 Other specified diseases and conditions complicating pregnancy: Secondary | ICD-10-CM

## 2022-08-10 DIAGNOSIS — R1030 Lower abdominal pain, unspecified: Secondary | ICD-10-CM

## 2022-08-10 DIAGNOSIS — R109 Unspecified abdominal pain: Secondary | ICD-10-CM | POA: Diagnosis not present

## 2022-08-10 DIAGNOSIS — O99331 Smoking (tobacco) complicating pregnancy, first trimester: Secondary | ICD-10-CM | POA: Insufficient documentation

## 2022-08-10 DIAGNOSIS — Z3A01 Less than 8 weeks gestation of pregnancy: Secondary | ICD-10-CM | POA: Diagnosis not present

## 2022-08-10 DIAGNOSIS — R102 Pelvic and perineal pain: Secondary | ICD-10-CM | POA: Diagnosis present

## 2022-08-10 DIAGNOSIS — B9689 Other specified bacterial agents as the cause of diseases classified elsewhere: Secondary | ICD-10-CM

## 2022-08-10 LAB — COMPREHENSIVE METABOLIC PANEL
ALT: 17 U/L (ref 0–44)
AST: 19 U/L (ref 15–41)
Albumin: 3.8 g/dL (ref 3.5–5.0)
Alkaline Phosphatase: 46 U/L (ref 38–126)
Anion gap: 9 (ref 5–15)
BUN: 7 mg/dL (ref 6–20)
CO2: 22 mmol/L (ref 22–32)
Calcium: 9.4 mg/dL (ref 8.9–10.3)
Chloride: 104 mmol/L (ref 98–111)
Creatinine, Ser: 0.6 mg/dL (ref 0.44–1.00)
GFR, Estimated: >60 mL/min (ref >60)
Glucose, Bld: 74 mg/dL (ref 70–99)
Potassium: 3.8 mmol/L (ref 3.5–5.1)
Sodium: 135 mmol/L (ref 135–145)
Total Bilirubin: 0.4 mg/dL (ref 0.3–1.2)
Total Protein: 6.5 g/dL (ref 6.5–8.1)

## 2022-08-10 LAB — URINALYSIS, ROUTINE W REFLEX MICROSCOPIC
Bilirubin Urine: NEGATIVE
Glucose, UA: NEGATIVE mg/dL
Ketones, ur: NEGATIVE mg/dL
Leukocytes,Ua: NEGATIVE
Nitrite: NEGATIVE
Protein, ur: NEGATIVE mg/dL
Specific Gravity, Urine: 1.02 (ref 1.005–1.030)
pH: 5 (ref 5.0–8.0)

## 2022-08-10 LAB — CBC
HCT: 38 % (ref 36.0–46.0)
Hemoglobin: 12.6 g/dL (ref 12.0–15.0)
MCH: 28.3 pg (ref 26.0–34.0)
MCHC: 33.2 g/dL (ref 30.0–36.0)
MCV: 85.4 fL (ref 80.0–100.0)
Platelets: 294 10*3/uL (ref 150–400)
RBC: 4.45 MIL/uL (ref 3.87–5.11)
RDW: 12.6 % (ref 11.5–15.5)
WBC: 7.8 10*3/uL (ref 4.0–10.5)
nRBC: 0 % (ref 0.0–0.2)

## 2022-08-10 LAB — WET PREP, GENITAL
Sperm: NONE SEEN
Trich, Wet Prep: NONE SEEN
WBC, Wet Prep HPF POC: <10 (ref <10)
Yeast Wet Prep HPF POC: NONE SEEN

## 2022-08-10 LAB — HCG, QUANTITATIVE, PREGNANCY: hCG, Beta Chain, Quant, S: 20789 m[IU]/mL — ABNORMAL HIGH (ref <5)

## 2022-08-10 LAB — ABO/RH: ABO/RH(D): B POS

## 2022-08-10 MED ORDER — ACETAMINOPHEN 500 MG PO TABS: 1000.0000 mg | ORAL_TABLET | Freq: Once | ORAL | Status: DC

## 2022-08-10 MED ORDER — METRONIDAZOLE 500 MG PO TABS: 500.0000 mg | ORAL_TABLET | Freq: Two times a day (BID) | ORAL | 0 refills | Status: DC

## 2022-08-10 NOTE — MAU Note (Addendum)
Pt says she went to [address] yesterday - for preg confirmation. Next appointment is Monday She called [facility]- today- closed  Feels cramps- started last night at 2300.7/10- no meds  Nausea started same time  Vomited 2315 yesterday She works Librarian, academic- supervisor - 9p-7a.- no sleep today

## 2022-08-10 NOTE — MAU Provider Note (Signed)
History     CSN: [id number]  Arrival date and time: 08/10/22 1934   Event Date/Time   First Provider Initiated Contact with Patient 08/10/22 2017      No chief complaint on file.   CESAR MUSACCHIA is a 27 y.o. G3P0010 at Unknown [place] d/t irregular menstrual cycles.  She presents today for abdominal cramping.  She states the cramping has been present throughout the day and was initially on her sides.  She states the pain is now in the middle of her abdomen above her pelvis and below her umbilicus.  She states the pain is constant and is sharp shooting.  She rates the pain a 7/10.  No known worsening or relieving factors.    OB History     Gravida  3   Para  0   Term      Preterm      AB  1   Living  0      SAB      IAB  1   Ectopic      Multiple      Live Births              Past Medical History:  Diagnosis Date   ADHD (attention deficit hyperactivity disorder)    Adopted    Anxiety    Bipolar disorder (HCC)    Depression     Past Surgical History:  Procedure Laterality Date   SMALL INTESTINE SURGERY     TONSILLECTOMY      Family History  Adopted: Yes    Social History   Tobacco Use   Smoking status: Every Day    Current packs/day: 1.00    Average packs/day: 1 pack/day for 6.0 years (6.0 ttl pk-yrs)    Types: Cigarettes   Smokeless tobacco: Never  Vaping Use   Vaping status: Never Used  Substance Use Topics   Alcohol use: Yes    Alcohol/week: 14.0 standard drinks of alcohol    Types: 14 Cans of beer per week   Drug use: Yes    Types: Marijuana    Comment: 7.0 grams per week    Allergies:  Allergies  Allergen Reactions   Geodon [Ziprasidone Hcl] Other (See Comments)    Caused manic episode   Penicillins Swelling and Rash    Has patient had a PCN reaction causing immediate rash, facial/tongue/throat swelling, SOB or lightheadedness with hypotension:YES Has patient had a PCN reaction causing severe rash involving mucus membranes or  skin necrosis: NO Has patient had a PCN reaction that required hospitalization NO Has patient had a PCN reaction occurring within the last 10 years: NO If all of the above answers are "NO", then may proceed with Cephalosporin use.    Tamsulosin Rash    Medications Prior to Admission  Medication Sig Dispense Refill Last Dose   acetaminophen (TYLENOL) 500 MG tablet Take 2 tablets (1,000 mg total) by mouth every 6 (six) hours as needed for mild pain or moderate pain. Do NOT take if you have taken APAP-Pamabrom-Pyrilamine (Patient not taking: Reported on 08/09/2022) 30 tablet 0    APAP-Pamabrom-Pyrilamine 500-25-15 MG TABS Take 1 tablet by mouth daily as needed (menstrual cramps). Midol Complete Caffeine Free (Patient not taking: Reported on 08/09/2022)      dicyclomine (BENTYL) 20 MG tablet Take 1 tablet (20 mg total) by mouth 2 (two) times daily. (Patient not taking: Reported on 08/09/2022) 20 tablet 0    gabapentin (NEURONTIN) 300 MG capsule Take 1  capsule (300 mg total) by mouth 3 (three) times daily. (Patient not taking: Reported on 08/09/2022) 30 capsule 0    ibuprofen (ADVIL) 800 MG tablet Take 1 tablet (800 mg total) by mouth every 8 (eight) hours as needed. (Patient not taking: Reported on 08/09/2022) 30 tablet 0    lidocaine (LIDODERM) 5 % Place 1 patch onto the skin daily. Remove & Discard patch within 12 hours or as directed by MD (Patient not taking: Reported on 08/09/2022) 30 patch 0    lurasidone (LATUDA) 40 MG TABS tablet Take 1 tablet (40 mg total) by mouth daily with breakfast. (Patient not taking: Reported on 08/09/2022) 30 tablet 3    Multiple Vitamin (MULTIVITAMIN WITH MINERALS) TABS tablet Take 1 tablet by mouth daily with supper. (Patient not taking: Reported on 08/09/2022)      naproxen (NAPROSYN) 500 MG tablet Take 1 tablet (500 mg total) by mouth 2 (two) times daily. (Patient not taking: Reported on 08/09/2022) 20 tablet 0    sertraline (ZOLOFT) 50 MG tablet Take 1 tablet (50 mg total) by  mouth daily. (Patient not taking: Reported on 08/09/2022) 30 tablet 3    traMADol-acetaminophen (ULTRACET) 37.5-325 MG tablet Take 1 tablet by mouth 4 (four) times daily as needed (pain). (Patient not taking: Reported on 08/09/2022)      traZODone (DESYREL) 50 MG tablet Take 1 tablet (50 mg total) by mouth at bedtime. (Patient not taking: Reported on 08/09/2022) 30 tablet 3     Review of Systems  Gastrointestinal:  Positive for abdominal pain and nausea.  Genitourinary:  Negative for difficulty urinating and dysuria.   Physical Exam   Blood pressure (!) 109/56, pulse 91, temperature 98.7 F (37.1 C), temperature source Oral, resp. rate 16, height 5\' 5"  (1.651 m), weight 71.1 kg, last menstrual period 06/13/2022.  Physical Exam Vitals reviewed. Exam conducted with a chaperone present.  Constitutional:      General: She is not in acute distress.    Appearance: Normal appearance. She is not toxic-appearing.  HENT:     Head: Normocephalic and atraumatic.  Eyes:     Conjunctiva/sclera: Conjunctivae normal.  Cardiovascular:     Rate and Rhythm: Normal rate.  Pulmonary:     Effort: Pulmonary effort is normal. No respiratory distress.  Musculoskeletal:        General: Normal range of motion.     Cervical back: Normal range of motion.  Neurological:     Mental Status: She is oriented to person, place, and time. She is lethargic.  Psychiatric:        Attention and Perception: Attention normal.        Mood and Affect: Mood normal.        Behavior: Behavior normal.     MAU Course  Procedures Results for orders placed or performed during the hospital encounter of 08/10/22 (from the past 24 hour(s))  Wet prep, genital     Status: Abnormal   Collection Time: 08/10/22  8:17 PM   Specimen: PATH Cytology Cervicovaginal Ancillary Only  Result Value Ref Range   Yeast Wet Prep HPF POC NONE SEEN NONE SEEN   Trich, Wet Prep NONE SEEN NONE SEEN   Clue Cells Wet Prep HPF POC PRESENT (A) NONE SEEN    WBC, Wet Prep HPF POC <10 <10   Sperm NONE SEEN   Urinalysis, Routine w reflex microscopic -Urine, Clean Catch     Status: Abnormal   Collection Time: 08/10/22  8:23 PM  Result Value Ref  Range   Color, Urine YELLOW YELLOW   APPearance CLEAR CLEAR   Specific Gravity, Urine 1.020 1.005 - 1.030   pH 5.0 5.0 - 8.0   Glucose, UA NEGATIVE NEGATIVE mg/dL   Hgb urine dipstick SMALL (A) NEGATIVE   Bilirubin Urine NEGATIVE NEGATIVE   Ketones, ur NEGATIVE NEGATIVE mg/dL   Protein, ur NEGATIVE NEGATIVE mg/dL   Nitrite NEGATIVE NEGATIVE   Leukocytes,Ua NEGATIVE NEGATIVE   RBC / HPF 0-5 0 - 5 RBC/hpf   WBC, UA 0-5 0 - 5 WBC/hpf   Bacteria, UA RARE (A) NONE SEEN   Squamous Epithelial / HPF 0-5 0 - 5 /HPF   Mucus PRESENT   CBC     Status: None   Collection Time: 08/10/22  8:29 PM  Result Value Ref Range   WBC 7.8 4.0 - 10.5 K/uL   RBC 4.45 3.87 - 5.11 MIL/uL   Hemoglobin 12.6 12.0 - 15.0 g/dL   HCT 16.1 09.6 - 04.5 %   MCV 85.4 80.0 - 100.0 fL   MCH 28.3 26.0 - 34.0 pg   MCHC 33.2 30.0 - 36.0 g/dL   RDW 40.9 81.1 - 91.4 %   Platelets 294 150 - 400 K/uL   nRBC 0.0 0.0 - 0.2 %  ABO/Rh     Status: None   Collection Time: 08/10/22  8:29 PM  Result Value Ref Range   ABO/RH(D) B POS    No rh immune globuloin      NOT A RH IMMUNE GLOBULIN CANDIDATE, PT RH POSITIVE Performed at [facility], [address], [place], [place] [address]   Comprehensive metabolic panel     Status: None   Collection Time: 08/10/22  8:29 PM  Result Value Ref Range   Sodium 135 135 - 145 mmol/L   Potassium 3.8 3.5 - 5.1 mmol/L   Chloride 104 98 - 111 mmol/L   CO2 22 22 - 32 mmol/L   Glucose, Bld 74 70 - 99 mg/dL   BUN 7 6 - 20 mg/dL   Creatinine, Ser 6.21 0.44 - 1.00 mg/dL   Calcium 9.4 8.9 - 30.8 mg/dL   Total Protein 6.5 6.5 - 8.1 g/dL   Albumin 3.8 3.5 - 5.0 g/dL   AST 19 15 - 41 U/L   ALT 17 0 - 44 U/L   Alkaline Phosphatase 46 38 - 126 U/L   Total Bilirubin 0.4 0.3 - 1.2 mg/dL   GFR, Estimated  >65 >78 mL/min   Anion gap 9 5 - 15  hCG, quantitative, pregnancy     Status: Abnormal   Collection Time: 08/10/22  8:29 PM  Result Value Ref Range   hCG, Beta Chain, Quant, S 20,789 (H) <5 mIU/mL   US OB LESS THAN 14 WEEKS WITH OB TRANSVAGINAL  Result Date: 08/10/2022 CLINICAL DATA:  Pregnant patient with abdominal cramping. EXAM: OBSTETRIC <14 WK [place] AND TRANSVAGINAL OB US TECHNIQUE: Both transabdominal and transvaginal ultrasound examinations were performed for complete evaluation of the gestation as well as the maternal uterus, adnexal regions, and pelvic cul-de-sac. Transvaginal technique was performed to assess early pregnancy. COMPARISON:  None available this pregnancy. FINDINGS: Intrauterine gestational sac: Single Yolk sac:  Visualized. Embryo:  Visualized. Cardiac Activity: Visualized. Heart Rate: 123 bpm CRL:  3.5 mm   6 w   0 d                  [place] EDC: 04/05/2023 Subchorionic hemorrhage:  None visualized. Maternal uterus/adnexae: Both ovaries  are visualized and demonstrate blood flow. No ovarian cyst or adnexal mass. Trace pelvic free fluid. IMPRESSION: Single live intrauterine pregnancy estimated gestational age [redacted] weeks 0 days based on crown-rump length for ultrasound [facility] 04/05/2023. No subchorionic hemorrhage. Electronically Signed   By: Narda Rutherford M.D.   On: 08/10/2022 21:22     MDM Cultures: Wet Prep and GC/CT Labs: UA, UPT, CBC, CMP, hCG, ABO Ultrasound Pain Medication Assessment and Plan  27 year old  G3P0010  Unknown GA  -Reviewed POC with patient. -Exam performed.  -Labs ordered. -Cultures to be collected by self swab.  -Patient offered and accepts pain medication. Will give tylenol now.  -Send for [place] and await results. -Cautioned that with unknown GA, [place] may not reveal any findings currently and further testing may be necessary. -Patient verbalizes understanding.   Cherre Robins 08/10/2022, 8:17 PM   Reassessment (10:57 PM) -Results as above. -Provider to  bedside to discuss. -Patient informed of findings and EDD. -Message sent to A. Pait to cancel [place] appt for Monday August 5th.  -Reviewed BV and patient opts for oral medication. -Rx sent to pharmacy on file.  -Precautions reviewed. -Encouraged to call and start [facility]. -Return to MAU if symptoms worsen or with the onset of new symptoms. -Discharged to home in stable condition.  Cherre Robins MSN, CNM Advanced Practice Provider, [facility] [employer] [facility]

## 2022-08-10 NOTE — Discharge Instructions (Signed)
  [facility] Providers          [facility]  [address], [place], [place] [address]  [phone]  [facility]  [address], [place], [place] [address]  [phone]  [facility]  [address], [place], [place] [address]  [phone]  [facility] [address], [place], [place] [address] [phone]  [facility] [place]  [address], [place], [place] [address]  [phone]  [facility]  [address] floor), [place], [place] [address]  [phone]  [facility]  [address], [place], [place] [address]  [phone]  [facility]  [address], [place], [place] [address]  [phone]  [facility]  [address], [place], [place] [address]  [phone]  [facility]  [address], [place], [place] [address]  [phone]  [facility]  [address], [place], [place] [address]  [phone]  [facility]  [address], [place], [place] [address]  [phone]  [facility]   [address], [place], [place] [address]  [phone]  [facility]  [address], [place], [place] [address]   [phone]  [facility] [address], [place], [place] [address] [phone]  [facility]  [address], , [place] [address]  [phone]         

## 2022-08-13 ENCOUNTER — Other Ambulatory Visit: Payer: Medicaid Other

## 2022-08-21 ENCOUNTER — Telehealth: Payer: Self-pay

## 2022-08-21 NOTE — Telephone Encounter (Signed)
VM left on nurse line stating she recently had a pregnancy confirmation visit. Reports trouble breathing for past 3 days with dry cough. Describes being "out of breath a lot."

## 2022-08-21 NOTE — Telephone Encounter (Signed)
Called pt. VM left stating our office will call again in the morning. Reviewed availability of MAU and after hours triage RN. Callback number given.

## 2022-08-22 NOTE — Telephone Encounter (Signed)
Called pt; VM left with callback number.

## 2022-08-30 ENCOUNTER — Ambulatory Visit: Payer: Self-pay

## 2022-08-30 ENCOUNTER — Inpatient Hospital Stay (HOSPITAL_COMMUNITY)
Admission: AD | Admit: 2022-08-30 | Discharge: 2022-08-30 | Disposition: A | Discharge status: Home / Self Care | Payer: Medicaid Other | Attending: Obstetrics & Gynecology | Admitting: Obstetrics & Gynecology

## 2022-08-30 ENCOUNTER — Other Ambulatory Visit: Payer: Self-pay

## 2022-08-30 DIAGNOSIS — R52 Pain, unspecified: Secondary | ICD-10-CM | POA: Diagnosis not present

## 2022-08-30 DIAGNOSIS — J069 Acute upper respiratory infection, unspecified: Secondary | ICD-10-CM | POA: Diagnosis not present

## 2022-08-30 DIAGNOSIS — Z3A08 8 weeks gestation of pregnancy: Secondary | ICD-10-CM | POA: Diagnosis not present

## 2022-08-30 DIAGNOSIS — O99511 Diseases of the respiratory system complicating pregnancy, first trimester: Secondary | ICD-10-CM | POA: Diagnosis not present

## 2022-08-30 DIAGNOSIS — O26891 Other specified pregnancy related conditions, first trimester: Secondary | ICD-10-CM | POA: Diagnosis not present

## 2022-08-30 DIAGNOSIS — R6889 Other general symptoms and signs: Secondary | ICD-10-CM | POA: Diagnosis not present

## 2022-08-30 DIAGNOSIS — Z1152 Encounter for screening for COVID-19: Secondary | ICD-10-CM | POA: Insufficient documentation

## 2022-08-30 DIAGNOSIS — Z3A09 9 weeks gestation of pregnancy: Secondary | ICD-10-CM | POA: Diagnosis not present

## 2022-08-30 LAB — RESP PANEL BY RT-PCR (RSV, FLU A&B, COVID)  RVPGX2
Influenza A by PCR: NEGATIVE
Influenza B by PCR: NEGATIVE
Resp Syncytial Virus by PCR: NEGATIVE
SARS Coronavirus 2 by RT PCR: NEGATIVE

## 2022-08-30 MED ORDER — GUAIFENESIN ER 600 MG PO TB12: 600.0000 mg | ORAL_TABLET | Freq: Two times a day (BID) | ORAL | 1 refills | Status: DC

## 2022-08-30 NOTE — MAU Provider Note (Signed)
Chief Complaint: Flu like symptoms and Chest tightness   Event Date/Time   First Provider Initiated Contact with Patient 08/30/22 2102        SUBJECTIVE HPI: Jordan Hanson is a 27 y.o. G3P0010 at [redacted]w[redacted]d by LMP who presents to maternity admissions reporting flu-like symptoms.  They started today.  States chest feels congested, no chest pain  No history of asthma.  . She denies vaginal bleeding, vaginal itching/burning, urinary symptoms, h/a, dizziness, n/v   Cough This is a new problem. The current episode started today. The cough is Non-productive. Associated symptoms include myalgias and nasal congestion. Pertinent negatives include no chest pain, fever or heartburn.   RN Note:  Jordan Hanson is a 27 y.o. at [redacted]w[redacted]d here in MAU reporting: she having flu like symptoms, body aches, non productive cough, chest tightness, and fatigue.  States feels like chest is being squeezed in the front and back. Denies VB or cramping LMP: NA Onset of complaint: today Pain score: 8    Past Medical History:  Diagnosis Date   ADHD (attention deficit hyperactivity disorder)    Adopted    Anxiety    Bipolar disorder (HCC)    Depression    Past Surgical History:  Procedure Laterality Date   SMALL INTESTINE SURGERY     TONSILLECTOMY     Social History   Socioeconomic History   Marital status: Single    Spouse name: Not on file   Number of children: Not on file   Years of education: Not on file   Highest education level: Not on file  Occupational History   Not on file  Tobacco Use   Smoking status: Every Day    Current packs/day: 1.00    Average packs/day: 1 pack/day for 6.0 years (6.0 ttl pk-yrs)    Types: Cigarettes   Smokeless tobacco: Never  Vaping Use   Vaping status: Never Used  Substance and Sexual Activity   Alcohol use: Yes    Alcohol/week: 14.0 standard drinks of alcohol    Types: 14 Cans of beer per week   Drug use: Yes    Types: Marijuana    Comment: 7.0 grams per week    Sexual activity: Yes    Partners: Male    Birth control/protection: Condom, None    Comment: sig other together 4 years  Other Topics Concern   Not on file  Social History Narrative   Not on file   Social Determinants of Health   Financial Resource Strain: Low Risk  (07/18/2020)   Overall Financial Resource Strain (CARDIA)    Difficulty of Paying Living Expenses: Not hard at all  Food Insecurity: No Food Insecurity (07/18/2020)   Hunger Vital Sign    Worried About Running Out of Food in the Last Year: Never true    Ran Out of Food in the Last Year: Never true  Transportation Needs: No Transportation Needs (07/18/2020)   PRAPARE - Administrator, Civil Service (Medical): No    Lack of Transportation (Non-Medical): No  Physical Activity: Sufficiently Active (07/18/2020)   Exercise Vital Sign    Days of Exercise per Week: 7 days    Minutes of Exercise per Session: 60 min  Stress: Stress Concern Present (07/18/2020)   [employer] of Occupational Health - Occupational Stress Questionnaire    Feeling of Stress : Very much  Social Connections: Unknown (05/11/2021)   [facility], [facility]   Social Network    Social  Network: Not on file  Intimate Partner Violence: Unknown (04/11/2021)   [facility] from [facility], [facility]   HITS    Physically Hurt: Not on file    Insult or Talk Down To: Not on file    Threaten Physical Harm: Not on file    Scream or Curse: Not on file   No current facility-administered medications on file prior to encounter.   Current Outpatient Medications on File Prior to Encounter  Medication Sig Dispense Refill   metroNIDAZOLE (FLAGYL) 500 MG tablet Take 1 tablet (500 mg total) by mouth 2 (two) times daily. 14 tablet 0   [DISCONTINUED] cetirizine (ZYRTEC ALLERGY) 10 MG tablet Take 1 tablet (10 mg total) by mouth daily. (Patient not taking: Reported on 07/30/2020) 30 tablet 0   [DISCONTINUED] fluticasone (FLONASE) 50 MCG/ACT  nasal spray Place 1 spray into both nostrils daily. (Patient not taking: Reported on 07/30/2020) 16 g 0   Allergies  Allergen Reactions   Geodon [Ziprasidone Hcl] Other (See Comments)    Caused manic episode   Penicillins Swelling and Rash    Has patient had a PCN reaction causing immediate rash, facial/tongue/throat swelling, SOB or lightheadedness with hypotension:YES Has patient had a PCN reaction causing severe rash involving mucus membranes or skin necrosis: NO Has patient had a PCN reaction that required hospitalization NO Has patient had a PCN reaction occurring within the last 10 years: NO If all of the above answers are "NO", then may proceed with Cephalosporin use.    Tamsulosin Rash    I have reviewed patient's Past Medical Hx, Surgical Hx, Family Hx, Social Hx, medications and allergies.   ROS:  Review of Systems  Constitutional:  Negative for fever.  Respiratory:  Positive for cough.   Cardiovascular:  Negative for chest pain.  Gastrointestinal:  Negative for heartburn.  Musculoskeletal:  Positive for myalgias.   Review of Systems  Other systems negative   Physical Exam  Physical Exam Patient Vitals for the past 24 hrs:  BP Temp Temp src Pulse Resp SpO2 Height Weight  08/30/22 1845 107/62 98.3 F (36.8 C) Oral 87 19 100 % -- --  08/30/22 1839 -- -- -- -- -- -- 5\' 5"  (1.651 m) 71.8 kg   Constitutional: Well-developed, well-nourished female in no acute distress.  Cardiovascular: normal rate Respiratory: normal effort, Lungs CTAB GI: Abd soft, non-tender.  MS: Extremities nontender, no edema, normal ROM Neurologic: Alert and oriented x 4.  GU: Neg CVAT.  LAB RESULTS Results for orders placed or performed during the hospital encounter of 08/30/22 (from the past 24 hour(s))  Resp panel by RT-PCR (RSV, Flu A&B, Covid) Anterior Nasal Swab     Status: None   Collection Time: 08/30/22  6:48 PM   Specimen: Anterior Nasal Swab  Result Value Ref Range   SARS  Coronavirus 2 by RT PCR NEGATIVE NEGATIVE   Influenza A by PCR NEGATIVE NEGATIVE   Influenza B by PCR NEGATIVE NEGATIVE   Resp Syncytial Virus by PCR NEGATIVE NEGATIVE    --/--/B POS (08/02 2029)  IMAGING   MAU Management/MDM: I have reviewed the triage vital signs and the nursing notes.   Pertinent labs & imaging results that were available during my care of the patient were reviewed by me and considered in my medical decision making (see chart for details).      I have reviewed her medical records including past results, notes and treatments. Medical, Surgical, and family history were reviewed.  Medications and recent  lab tests were reviewed  Reviewed results of testing Discussed normal progression and recommended treatment for what is likely a viral upper respiratory infection   ASSESSMENT Single IUP at [redacted]w[redacted]d Upper respiratory infection, likely viral  Body aches  PLAN Discharge home Rx Mucinex for cough Fluids and supportive care  Pt stable at time of discharge. Encouraged to return here if she develops worsening of symptoms, increase in pain, fever, or other concerning symptoms.    Wynelle Bourgeois CNM, MSN Certified Nurse-Midwife 08/30/2022  9:02 PM

## 2022-08-30 NOTE — Telephone Encounter (Signed)
  Chief Complaint: Intermittent chest pain - URI infections/s Symptoms: above Frequency: 1.5 weeks - URI a few days Pertinent Negatives: Patient denies  Disposition: [x] ED /[] Urgent Care (no appt availability in office) / [] Appointment(In office/virtual)/ []  [place] [facility]/ [] Home Care/ [] Refused Recommended Disposition /[] [place] [facility]/ []  Follow-up with PCP Additional Notes: Pt states that she has had intermittent chest tightness over the past 1.5 weeks. Pt woke up with URI infection s/s, Cough, congestion. PT is [redacted] weeks pregnant. Pt will go to ED for evaluation of chest tightness.  Summary: head and chest congestion   Patient called stated she is [redacted] weeks pregnant and need to see what she can take for head and chest congestion. Please f/u with patient     Reason for Disposition  [1] Chest pain (or "angina") comes and goes AND [2] is happening more often (increasing in frequency) or getting worse (increasing in severity)  (Exception: Chest pains that last only a few seconds.)  Answer Assessment - Initial Assessment Questions 1. LOCATION: "Where does it hurt?"       Chest 2. RADIATION: "Does the pain go anywhere else?" (e.g., into neck, jaw, arms, back)     no 3. ONSET: "When did the chest pain begin?" (Minutes, hours or days)      1.5 weeks ago 4. PATTERN: "Does the pain come and go, or has it been constant since it started?"  "Does it get worse with exertion?"      Comes and goes 5. DURATION: "How long does it last" (e.g., seconds, minutes, hours)     minutes 6. SEVERITY: "How bad is the pain?"  (e.g., Scale 1-10; mild, moderate, or severe)    - MILD (1-3): doesn't interfere with normal activities     - MODERATE (4-7): interferes with normal activities or awakens from sleep    - SEVERE (8-10): excruciating pain, unable to do any normal activities       moderate 7. CARDIAC RISK FACTORS: "Do you have any history of heart problems or risk factors for heart disease?"  (e.g., angina, prior heart attack; diabetes, high blood pressure, high cholesterol, smoker, or strong family history of heart disease)     None that she knows of 8. PULMONARY RISK FACTORS: "Do you have any history of lung disease?"  (e.g., blood clots in lung, asthma, emphysema, birth control pills)     no 9. CAUSE: "What do you think is causing the chest pain?"     Unsure 10. OTHER SYMPTOMS: "Do you have any other symptoms?" (e.g., dizziness, nausea, vomiting, sweating, fever, difficulty breathing, cough)       Congestion , cough, 11. PREGNANCY: "Is there any chance you are pregnant?" "When was your last menstrual period?"       Pt is pregnant.  Protocols used: Chest Pain-A-AH

## 2022-08-30 NOTE — MAU Note (Addendum)
Jordan Hanson is a 27 y.o. at [redacted]w[redacted]d here in MAU reporting: she having flu like symptoms, body aches, non productive cough, chest tightness, and fatigue.  States feels like chest is being squeezed in the front and back. Denies VB or cramping LMP: NA Onset of complaint: today Pain score: 8 Vitals:   08/30/22 1845  BP: 107/62  Pulse: 87  Resp: 19  Temp: 98.3 F (36.8 C)  SpO2: 100%     FHT:NA Lab orders placed from triage:   None

## 2022-09-18 DIAGNOSIS — Z3687 Encounter for antenatal screening for uncertain dates: Secondary | ICD-10-CM | POA: Diagnosis not present

## 2022-09-18 DIAGNOSIS — Z3201 Encounter for pregnancy test, result positive: Secondary | ICD-10-CM | POA: Diagnosis not present

## 2022-10-08 DIAGNOSIS — Z3402 Encounter for supervision of normal first pregnancy, second trimester: Secondary | ICD-10-CM | POA: Diagnosis not present

## 2022-10-09 ENCOUNTER — Ambulatory Visit (HOSPITAL_COMMUNITY)
Admission: EM | Admit: 2022-10-09 | Discharge: 2022-10-09 | Disposition: A | Discharge status: Home / Self Care | Payer: Medicaid Other | Attending: Family Medicine | Admitting: Family Medicine

## 2022-10-09 ENCOUNTER — Encounter (HOSPITAL_COMMUNITY): Payer: Self-pay | Admitting: Emergency Medicine

## 2022-10-09 DIAGNOSIS — M25572 Pain in left ankle and joints of left foot: Secondary | ICD-10-CM

## 2022-10-09 NOTE — ED Triage Notes (Signed)
Pt c/o left ankle pain and swelling that started this morning. She states she works at [employer] and is unsure how she injured. she has elevated ankle and swelling went down but does still have tingling and pain.

## 2022-10-09 NOTE — Discharge Instructions (Signed)
Continue Tylenol as needed

## 2022-10-09 NOTE — ED Notes (Signed)
Work note reviewed with patient

## 2022-10-09 NOTE — ED Provider Notes (Signed)
[facility]   [id number] 10/09/22 Arrival Time: 1317  ASSESSMENT & PLAN:  1. Acute left ankle pain    Denies trauma. Will treat as ankle sprain. Feels she cannot miss work; stands most of her shift. CAM boot for comfort. Tylenol as needed; is pregnant. Work/school excuse note: provided. Recommend:  Follow-up Information     [facility]   Specialty: Urgent Care Why: If worsening or failing to improve as anticipated. Contact information: [address] [place] [address] [phone]                Reviewed expectations re: course of current medical issues. Questions answered. Outlined signs and symptoms indicating need for more acute intervention. Patient verbalized understanding. After Visit Summary given.  SUBJECTIVE: History from: patient. Jordan Hanson is a 27 y.o. female who reports left ankle pain and swelling that started this morning. She states she works at [employer] and is unsure how she injured. she has elevated ankle and swelling went down but does still have tingling and pain. No tx PTA.  Past Surgical History:  Procedure Laterality Date   SMALL INTESTINE SURGERY     TONSILLECTOMY        OBJECTIVE:  Vitals:   10/09/22 1330  BP: 100/67  Pulse: 91  Resp: 16  Temp: 98.1 F (36.7 C)  TempSrc: Oral  SpO2: 97%    General appearance: alert; no distress HEENT: [place]; AT Neck: supple with FROM Resp: unlabored respirations Extremities: LLE: warm with well perfused appearance; fairly well localized moderate tenderness over left lateral ankle without post malleolus TTP; without gross deformities; swelling: minimal; bruising: none; ankle ROM: normal, with discomfort CV: brisk extremity capillary refill of LLE; 2+ DP pulse of LLE. Skin: warm and dry; no visible rashes Neurologic: normal sensation and strength of LLE Psychological: alert and cooperative; normal mood and affect    Allergies   Allergen Reactions   Geodon [Ziprasidone Hcl] Other (See Comments)    Caused manic episode   Penicillins Swelling and Rash    Has patient had a PCN reaction causing immediate rash, facial/tongue/throat swelling, SOB or lightheadedness with hypotension:YES Has patient had a PCN reaction causing severe rash involving mucus membranes or skin necrosis: NO Has patient had a PCN reaction that required hospitalization NO Has patient had a PCN reaction occurring within the last 10 years: NO If all of the above answers are "NO", then may proceed with Cephalosporin use.    Tamsulosin Rash    Past Medical History:  Diagnosis Date   ADHD (attention deficit hyperactivity disorder)    Adopted    Anxiety    Bipolar disorder (HCC)    Depression    Social History   Socioeconomic History   Marital status: Single    Spouse name: Not on file   Number of children: Not on file   Years of education: Not on file   Highest education level: Not on file  Occupational History   Not on file  Tobacco Use   Smoking status: Former    Current packs/day: 1.00    Average packs/day: 1 pack/day for 6.0 years (6.0 ttl pk-yrs)    Types: Cigarettes   Smokeless tobacco: Never  Vaping Use   Vaping status: Never Used  Substance and Sexual Activity   Alcohol use: Not Currently    Alcohol/week: 14.0 standard drinks of alcohol    Types: 14 Cans of beer per week   Drug use: Not  Currently    Types: Marijuana    Comment: 7.0 grams per week   Sexual activity: Yes    Partners: Male    Birth control/protection: Condom, None    Comment: sig other together 4 years  Other Topics Concern   Not on file  Social History Narrative   Not on file   Social Determinants of Health   Financial Resource Strain: Low Risk  (09/18/2022)   Received from [facility]   Overall Financial Resource Strain (CARDIA)    Difficulty of Paying Living Expenses: Not hard at all  Food Insecurity: No Food Insecurity (09/18/2022)    Received from [facility]   Hunger Vital Sign    Worried About Running Out of Food in the Last Year: Never true    Ran Out of Food in the Last Year: Never true  Transportation Needs: No Transportation Needs (09/18/2022)   Received from [facility] - Transportation    Lack of Transportation (Medical): No    Lack of Transportation (Non-Medical): No  Physical Activity: Sufficiently Active (07/18/2020)   Exercise Vital Sign    Days of Exercise per Week: 7 days    Minutes of Exercise per Session: 60 min  Stress: Stress Concern Present (07/18/2020)   [employer] of Occupational Health - Occupational Stress Questionnaire    Feeling of Stress : Very much  Social Connections: Unknown (05/11/2021)   Received from [facility]   Social Network    Social Network: Not on file   Family History  Adopted: Yes   Past Surgical History:  Procedure Laterality Date   SMALL INTESTINE SURGERY     TONSILLECTOMY         Mardella Layman, MD 10/09/22 [phone]

## 2022-10-21 DIAGNOSIS — F319 Bipolar disorder, unspecified: Secondary | ICD-10-CM | POA: Diagnosis not present

## 2022-10-21 DIAGNOSIS — Z880 Allergy status to penicillin: Secondary | ICD-10-CM | POA: Diagnosis not present

## 2022-10-21 DIAGNOSIS — J029 Acute pharyngitis, unspecified: Secondary | ICD-10-CM | POA: Diagnosis not present

## 2022-10-21 DIAGNOSIS — Z87891 Personal history of nicotine dependence: Secondary | ICD-10-CM | POA: Diagnosis not present

## 2022-10-21 DIAGNOSIS — R059 Cough, unspecified: Secondary | ICD-10-CM | POA: Diagnosis not present

## 2022-10-21 DIAGNOSIS — J069 Acute upper respiratory infection, unspecified: Secondary | ICD-10-CM | POA: Diagnosis not present

## 2022-10-21 DIAGNOSIS — Z888 Allergy status to other drugs, medicaments and biological substances status: Secondary | ICD-10-CM | POA: Diagnosis not present

## 2022-10-21 DIAGNOSIS — F909 Attention-deficit hyperactivity disorder, unspecified type: Secondary | ICD-10-CM | POA: Diagnosis not present

## 2022-10-23 DIAGNOSIS — Z124 Encounter for screening for malignant neoplasm of cervix: Secondary | ICD-10-CM | POA: Diagnosis not present

## 2022-10-23 DIAGNOSIS — Z3400 Encounter for supervision of normal first pregnancy, unspecified trimester: Secondary | ICD-10-CM | POA: Diagnosis not present

## 2022-10-25 ENCOUNTER — Ambulatory Visit (HOSPITAL_COMMUNITY)
Admission: EM | Admit: 2022-10-25 | Discharge: 2022-10-25 | Discharge status: Against Medical Advice | Payer: Medicaid Other

## 2022-10-25 DIAGNOSIS — J069 Acute upper respiratory infection, unspecified: Secondary | ICD-10-CM | POA: Diagnosis not present

## 2022-11-21 DIAGNOSIS — Z3689 Encounter for other specified antenatal screening: Secondary | ICD-10-CM | POA: Diagnosis not present

## 2022-11-27 DIAGNOSIS — J0140 Acute pansinusitis, unspecified: Secondary | ICD-10-CM | POA: Diagnosis not present

## 2022-12-19 DIAGNOSIS — Z3400 Encounter for supervision of normal first pregnancy, unspecified trimester: Secondary | ICD-10-CM | POA: Diagnosis not present

## 2023-01-16 DIAGNOSIS — Z23 Encounter for immunization: Secondary | ICD-10-CM | POA: Diagnosis not present

## 2023-01-22 DIAGNOSIS — O26899 Other specified pregnancy related conditions, unspecified trimester: Secondary | ICD-10-CM | POA: Diagnosis not present

## 2023-01-22 DIAGNOSIS — R109 Unspecified abdominal pain: Secondary | ICD-10-CM | POA: Diagnosis not present

## 2023-01-22 DIAGNOSIS — Z3A34 34 weeks gestation of pregnancy: Secondary | ICD-10-CM | POA: Diagnosis not present

## 2023-02-14 DIAGNOSIS — Z3A32 32 weeks gestation of pregnancy: Secondary | ICD-10-CM | POA: Diagnosis not present

## 2023-02-14 DIAGNOSIS — Z23 Encounter for immunization: Secondary | ICD-10-CM | POA: Diagnosis not present

## 2023-02-14 DIAGNOSIS — Z3400 Encounter for supervision of normal first pregnancy, unspecified trimester: Secondary | ICD-10-CM | POA: Diagnosis not present

## 2023-02-14 DIAGNOSIS — Z2911 Encounter for prophylactic immunotherapy for respiratory syncytial virus (RSV): Secondary | ICD-10-CM | POA: Diagnosis not present

## 2023-02-18 DIAGNOSIS — Z1388 Encounter for screening for disorder due to exposure to contaminants: Secondary | ICD-10-CM | POA: Diagnosis not present

## 2023-02-18 DIAGNOSIS — Z0389 Encounter for observation for other suspected diseases and conditions ruled out: Secondary | ICD-10-CM | POA: Diagnosis not present

## 2023-02-18 DIAGNOSIS — Z3009 Encounter for other general counseling and advice on contraception: Secondary | ICD-10-CM | POA: Diagnosis not present

## 2023-02-28 DIAGNOSIS — Z87898 Personal history of other specified conditions: Secondary | ICD-10-CM | POA: Diagnosis not present

## 2023-02-28 DIAGNOSIS — A630 Anogenital (venereal) warts: Secondary | ICD-10-CM | POA: Diagnosis not present

## 2023-02-28 DIAGNOSIS — Z2839 Other underimmunization status: Secondary | ICD-10-CM | POA: Diagnosis not present

## 2023-02-28 DIAGNOSIS — O26899 Other specified pregnancy related conditions, unspecified trimester: Secondary | ICD-10-CM | POA: Diagnosis not present

## 2023-02-28 DIAGNOSIS — O09899 Supervision of other high risk pregnancies, unspecified trimester: Secondary | ICD-10-CM | POA: Diagnosis not present

## 2023-02-28 DIAGNOSIS — R109 Unspecified abdominal pain: Secondary | ICD-10-CM | POA: Diagnosis not present

## 2023-02-28 DIAGNOSIS — F319 Bipolar disorder, unspecified: Secondary | ICD-10-CM | POA: Diagnosis not present

## 2023-02-28 DIAGNOSIS — F1291 Cannabis use, unspecified, in remission: Secondary | ICD-10-CM | POA: Diagnosis not present

## 2023-02-28 DIAGNOSIS — F909 Attention-deficit hyperactivity disorder, unspecified type: Secondary | ICD-10-CM | POA: Diagnosis not present

## 2023-02-28 DIAGNOSIS — E0590 Thyrotoxicosis, unspecified without thyrotoxic crisis or storm: Secondary | ICD-10-CM | POA: Diagnosis not present

## 2023-02-28 DIAGNOSIS — G43901 Migraine, unspecified, not intractable, with status migrainosus: Secondary | ICD-10-CM | POA: Diagnosis not present

## 2023-02-28 DIAGNOSIS — F339 Major depressive disorder, recurrent, unspecified: Secondary | ICD-10-CM | POA: Diagnosis not present

## 2023-03-14 DIAGNOSIS — Z3685 Encounter for antenatal screening for Streptococcus B: Secondary | ICD-10-CM | POA: Diagnosis not present

## 2023-03-14 DIAGNOSIS — Z3400 Encounter for supervision of normal first pregnancy, unspecified trimester: Secondary | ICD-10-CM | POA: Diagnosis not present

## 2023-03-20 DIAGNOSIS — E039 Hypothyroidism, unspecified: Secondary | ICD-10-CM | POA: Diagnosis not present

## 2023-03-31 DIAGNOSIS — R000 Tachycardia, unspecified: Secondary | ICD-10-CM | POA: Diagnosis not present

## 2023-03-31 DIAGNOSIS — O99343 Other mental disorders complicating pregnancy, third trimester: Secondary | ICD-10-CM | POA: Diagnosis not present

## 2023-03-31 DIAGNOSIS — Z79899 Other long term (current) drug therapy: Secondary | ICD-10-CM | POA: Diagnosis not present

## 2023-03-31 DIAGNOSIS — F17210 Nicotine dependence, cigarettes, uncomplicated: Secondary | ICD-10-CM | POA: Diagnosis not present

## 2023-03-31 DIAGNOSIS — O479 False labor, unspecified: Secondary | ICD-10-CM | POA: Diagnosis not present

## 2023-03-31 DIAGNOSIS — Z880 Allergy status to penicillin: Secondary | ICD-10-CM | POA: Diagnosis not present

## 2023-03-31 DIAGNOSIS — F319 Bipolar disorder, unspecified: Secondary | ICD-10-CM | POA: Diagnosis not present

## 2023-03-31 DIAGNOSIS — Z888 Allergy status to other drugs, medicaments and biological substances status: Secondary | ICD-10-CM | POA: Diagnosis not present

## 2023-03-31 DIAGNOSIS — O99333 Smoking (tobacco) complicating pregnancy, third trimester: Secondary | ICD-10-CM | POA: Diagnosis not present

## 2023-03-31 DIAGNOSIS — Z3A39 39 weeks gestation of pregnancy: Secondary | ICD-10-CM | POA: Diagnosis not present

## 2023-03-31 DIAGNOSIS — O471 False labor at or after 37 completed weeks of gestation: Secondary | ICD-10-CM | POA: Diagnosis not present

## 2023-04-07 DIAGNOSIS — O26893 Other specified pregnancy related conditions, third trimester: Secondary | ICD-10-CM | POA: Diagnosis not present

## 2023-04-07 DIAGNOSIS — O99354 Diseases of the nervous system complicating childbirth: Secondary | ICD-10-CM | POA: Diagnosis not present

## 2023-04-07 DIAGNOSIS — Z91414 Personal history of adult intimate partner abuse: Secondary | ICD-10-CM | POA: Diagnosis not present

## 2023-04-07 DIAGNOSIS — O9081 Anemia of the puerperium: Secondary | ICD-10-CM | POA: Diagnosis not present

## 2023-04-07 DIAGNOSIS — F319 Bipolar disorder, unspecified: Secondary | ICD-10-CM | POA: Diagnosis not present

## 2023-04-07 DIAGNOSIS — O99284 Endocrine, nutritional and metabolic diseases complicating childbirth: Secondary | ICD-10-CM | POA: Diagnosis not present

## 2023-04-07 DIAGNOSIS — F909 Attention-deficit hyperactivity disorder, unspecified type: Secondary | ICD-10-CM | POA: Diagnosis not present

## 2023-04-07 DIAGNOSIS — Z3A40 40 weeks gestation of pregnancy: Secondary | ICD-10-CM | POA: Diagnosis not present

## 2023-04-07 DIAGNOSIS — F17210 Nicotine dependence, cigarettes, uncomplicated: Secondary | ICD-10-CM | POA: Diagnosis not present

## 2023-04-07 DIAGNOSIS — E0590 Thyrotoxicosis, unspecified without thyrotoxic crisis or storm: Secondary | ICD-10-CM | POA: Diagnosis not present

## 2023-04-07 DIAGNOSIS — O480 Post-term pregnancy: Secondary | ICD-10-CM | POA: Diagnosis not present

## 2023-04-07 DIAGNOSIS — O99334 Smoking (tobacco) complicating childbirth: Secondary | ICD-10-CM | POA: Diagnosis not present

## 2023-04-07 DIAGNOSIS — Z880 Allergy status to penicillin: Secondary | ICD-10-CM | POA: Diagnosis not present

## 2023-04-07 DIAGNOSIS — O99214 Obesity complicating childbirth: Secondary | ICD-10-CM | POA: Diagnosis not present

## 2023-04-07 DIAGNOSIS — O09893 Supervision of other high risk pregnancies, third trimester: Secondary | ICD-10-CM | POA: Diagnosis not present

## 2023-04-07 DIAGNOSIS — G43909 Migraine, unspecified, not intractable, without status migrainosus: Secondary | ICD-10-CM | POA: Diagnosis not present

## 2023-04-07 DIAGNOSIS — O99344 Other mental disorders complicating childbirth: Secondary | ICD-10-CM | POA: Diagnosis not present

## 2023-04-07 DIAGNOSIS — Z3403 Encounter for supervision of normal first pregnancy, third trimester: Secondary | ICD-10-CM | POA: Diagnosis not present

## 2023-04-07 DIAGNOSIS — Z62819 Personal history of unspecified abuse in childhood: Secondary | ICD-10-CM | POA: Diagnosis not present

## 2023-04-07 DIAGNOSIS — R102 Pelvic and perineal pain: Secondary | ICD-10-CM | POA: Diagnosis not present

## 2023-04-07 DIAGNOSIS — D62 Acute posthemorrhagic anemia: Secondary | ICD-10-CM | POA: Diagnosis not present

## 2023-04-07 DIAGNOSIS — Z2839 Other underimmunization status: Secondary | ICD-10-CM | POA: Diagnosis not present
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
## 2023-04-08 NOTE — Care Plan (Signed)
  Problem: Discharge Planning Intervention: Discharge needs assessment (edit field to enter specific needs identified) Note: [name] and infant will discharge home when medically stable. Intervention: Facilitate communication re: discharge plan with patient/caregiver and pertinent members of the healthcare team Note: CM discussed plan of care with [name] and IDT.

## 2023-04-08 NOTE — Care Plan (Signed)
 Acknowledgement of AVS Instructions  Discharge instructions have been reviewed with the patient/support person. The patient/support person has been provided a copy of the discharge instructions.  The patient/support person has been given the opportunity for a demonstration of specific follow-up care tasks.

## 2023-04-10 ENCOUNTER — Inpatient Hospital Stay (HOSPITAL_COMMUNITY)

## 2023-04-10 ENCOUNTER — Encounter (HOSPITAL_COMMUNITY): Payer: Self-pay | Admitting: Obstetrics & Gynecology

## 2023-04-10 ENCOUNTER — Inpatient Hospital Stay (HOSPITAL_COMMUNITY)
Admission: AD | Admit: 2023-04-10 | Discharge: 2023-04-11 | Disposition: A | Discharge status: Home / Self Care | Attending: Obstetrics & Gynecology | Admitting: Obstetrics & Gynecology

## 2023-04-10 DIAGNOSIS — N854 Malposition of uterus: Secondary | ICD-10-CM | POA: Diagnosis not present

## 2023-04-10 DIAGNOSIS — O8612 Endometritis following delivery: Secondary | ICD-10-CM | POA: Insufficient documentation

## 2023-04-10 DIAGNOSIS — B9689 Other specified bacterial agents as the cause of diseases classified elsewhere: Secondary | ICD-10-CM | POA: Diagnosis not present

## 2023-04-10 DIAGNOSIS — N939 Abnormal uterine and vaginal bleeding, unspecified: Secondary | ICD-10-CM | POA: Diagnosis not present

## 2023-04-10 DIAGNOSIS — N852 Hypertrophy of uterus: Secondary | ICD-10-CM | POA: Diagnosis not present

## 2023-04-10 DIAGNOSIS — R531 Weakness: Secondary | ICD-10-CM | POA: Diagnosis not present

## 2023-04-10 DIAGNOSIS — R42 Dizziness and giddiness: Secondary | ICD-10-CM | POA: Diagnosis not present

## 2023-04-10 LAB — CBC
HCT: 29 % — ABNORMAL LOW (ref 36.0–46.0)
Hemoglobin: 9.6 g/dL — ABNORMAL LOW (ref 12.0–15.0)
MCH: 29 pg (ref 26.0–34.0)
MCHC: 33.1 g/dL (ref 30.0–36.0)
MCV: 87.6 fL (ref 80.0–100.0)
Platelets: 394 10*3/uL (ref 150–400)
RBC: 3.31 MIL/uL — ABNORMAL LOW (ref 3.87–5.11)
RDW: 13.1 % (ref 11.5–15.5)
WBC: 11.6 10*3/uL — ABNORMAL HIGH (ref 4.0–10.5)
nRBC: 0 % (ref 0.0–0.2)

## 2023-04-10 LAB — SAMPLE TO BLOOD BANK

## 2023-04-10 MED ORDER — ACETAMINOPHEN-CAFFEINE 500-65 MG PO TABS
2.0000 | ORAL_TABLET | Freq: Once | ORAL | Status: AC
  Administered 2023-04-10: 2 via ORAL
  Filled 2023-04-10: qty 2

## 2023-04-10 NOTE — MAU Provider Note (Signed)
 Chief Complaint:  No chief complaint on file.   Event Date/Time   First Provider Initiated Contact with Patient 04/10/23 2259       HPI: Jordan Hanson is a 28 y.o. [id number] who presents via EMS to maternity admissions reporting having delivered a baby with [facility] Sunday night  Delivery was uneventful  Since yesterday has had heavy bleeding with passage of clots about golf ball size.  Also has pain in uterus.  Took a single Tylenol this evening. Has not been taking ibuprofen. She reports vaginal bleeding, denies urinary symptoms, h/a, dizziness, n/v, or fever/chills.    Vaginal Bleeding The patient's primary symptoms include pelvic pain and vaginal bleeding. The problem has been waxing and waning. She is not pregnant. Associated symptoms include abdominal pain. Pertinent negatives include no constipation, diarrhea, fever, frequency or nausea. The vaginal discharge was bloody. The vaginal bleeding is heavier than menses. She has been passing clots. Nothing aggravates the symptoms. She has tried acetaminophen for the symptoms. The treatment provided no relief.   RN note: .Jordan Hanson is a 28 y.o. at [redacted]w[redacted]d here in MAU reporting:EMS arrival at 2257  M Mouna Yager CNM at bedside during EMS arrival   [facility] vag delivery at [facility] on Sunday night. Reports uncomplicated pregnancy and delivery. Was discharged on Monday afternoon. Reports has been having dark red vaginal bleeding since discharge with malodor beginning yesterday. Had 3 large clots come out Monday night, also reports burning with urination. Reports had laceration from vaginal delivery with stitches in place. Today reports passed one large clot with smaller clots. Reports has not been changing pad but believes should be changed every hour. Reports after church today began feeling bad, feeling weak, having chills and reports was told by family member that she looked pale. Denies episode of syncope or losing consciousness. Reports abdominal cramping  starting in MAU  Also reports HA 10/10, reports floaters when dialing EMS but denies at this time.   Tylenol at around 1600, 500 mg.   Onset of complaint: Monday night  Pain score: 6/10 abd cramping   Past Medical History: Past Medical History:  Diagnosis Date   ADHD (attention deficit hyperactivity disorder)    Adopted    Anxiety    Bipolar disorder (HCC)    Depression     Past obstetric history: OB History  Gravida Para Term Preterm AB Living  3 0   1 0  SAB IAB Ectopic Multiple Live Births   1       # Outcome Date GA Lbr Len/2nd Weight Sex Type Anes PTL Lv  3 Current           2 Gravida           1 IAB             Past Surgical History: Past Surgical History:  Procedure Laterality Date   SMALL INTESTINE SURGERY     TONSILLECTOMY      Family History: Family History  Adopted: Yes    Social History: Social History   Tobacco Use   Smoking status: Former    Current packs/day: 1.00    Average packs/day: 1 pack/day for 6.0 years (6.0 ttl pk-yrs)    Types: Cigarettes   Smokeless tobacco: Never  Vaping Use   Vaping status: Never Used  Substance Use Topics   Alcohol use: Not Currently    Alcohol/week: 14.0 standard drinks of alcohol    Types: 14 Cans of beer per week  Drug use: Not Currently    Types: Marijuana    Comment: 7.0 grams per week    Allergies:  Allergies  Allergen Reactions   Geodon [Ziprasidone Hcl] Other (See Comments)    Caused manic episode   Penicillins Swelling and Rash    Has patient had a PCN reaction causing immediate rash, facial/tongue/throat swelling, SOB or lightheadedness with hypotension:YES Has patient had a PCN reaction causing severe rash involving mucus membranes or skin necrosis: NO Has patient had a PCN reaction that required hospitalization NO Has patient had a PCN reaction occurring within the last 10 years: NO If all of the above answers are "NO", then may proceed with Cephalosporin use.    Tamsulosin Rash     Meds:  Medications Prior to Admission  Medication Sig Dispense Refill Last Dose/Taking   guaiFENesin (MUCINEX) 600 MG 12 hr tablet Take 1 tablet (600 mg total) by mouth 2 (two) times daily. 30 tablet 1    metroNIDAZOLE (FLAGYL) 500 MG tablet Take 1 tablet (500 mg total) by mouth 2 (two) times daily. 14 tablet 0     I have reviewed patient's Past Medical Hx, Surgical Hx, Family Hx, Social Hx, medications and allergies.  ROS:  Review of Systems  Constitutional:  Negative for fever.  Gastrointestinal:  Positive for abdominal pain. Negative for constipation, diarrhea and nausea.  Genitourinary:  Positive for pelvic pain and vaginal bleeding. Negative for frequency.   Other systems negative     Physical Exam  No data found. Constitutional: Well-developed, well-nourished female in no acute distress.  Cardiovascular: normal rate Respiratory: normal effort, no distress GI: Abd soft, non-tender.  Nondistended.  No rebound, No guarding.  Bowel Sounds audible  MS: Extremities nontender, no edema, normal ROM Neurologic: Alert and oriented x 4.   Grossly nonfocal. Skin:  Warm and Dry Psych:  Affect appropriate.  PELVIC EXAM: Uterus moderately tender.  Moderate lochia, no active hemorrhage.   Labs: Results for orders placed or performed during the hospital encounter of 04/10/23 (from the past 24 hours)  Sample to Blood Bank     Status: None   Collection Time: 04/10/23 11:26 PM  Result Value Ref Range   Blood Bank Specimen SAMPLE AVAILABLE FOR TESTING    Sample Expiration      04/13/2023,2359 Performed at [facility], [address] [place], [place] [address]   CBC     Status: Abnormal   Collection Time: 04/10/23 11:33 PM  Result Value Ref Range   WBC 11.6 (H) 4.0 - 10.5 K/uL   RBC 3.31 (L) 3.87 - 5.11 MIL/uL   Hemoglobin 9.6 (L) 12.0 - 15.0 g/dL   HCT 19.1 (L) 47.8 - 29.5 %   MCV 87.6 80.0 - 100.0 fL   MCH 29.0 26.0 - 34.0 pg   MCHC 33.1 30.0 - 36.0 g/dL   RDW 62.1  30.8 - 65.7 %   Platelets 394 150 - 400 K/uL   nRBC 0.0 0.0 - 0.2 %   Hemoglobin on 3/30-31 04/08/23  04/07/23  9.4 Low    11.3   --/--/B POS (08/02 2029)  Imaging:  US PELVIS (TRANSABDOMINAL ONLY) Result Date: 04/11/2023 CLINICAL DATA:  Postpartum vaginal bleeding EXAM: TRANSABDOMINAL ULTRASOUND OF PELVIS TECHNIQUE: Transabdominal ultrasound examination of the pelvis was performed including evaluation of the uterus, ovaries, adnexal regions, and pelvic cul-de-sac. COMPARISON:  08/10/2022 FINDINGS: Uterus Measurements: 15.3 x 8.3 x 11.8 cm = volume: 786 mL. The uterus is anteverted. The cervix is unremarkable. The uterus is enlarged in keeping with the history of recent gestation. The myometrial echotexture is diffusely heterogeneous coarsened and there is mild hypervascularity identified, however, discrete intrauterine mass is clearly identified. Endometrium Thickness: 13 mm. The endometrium is segmentally poorly delineated, however, appears asymmetrically focally thickened within the mid body uterus measuring up to 13-14 mm. This is best seen on image # 22. Right ovary Measurements: 3.0 x 1.5 x 2.5 cm = volume: 6 mL. Normal appearance/no adnexal mass. Left ovary Not visualized on this examination Other findings:  No abnormal free fluid. IMPRESSION: 1. Enlarged, heterogeneous, and hypervascular uterus in keeping with the history of recent gestation. 2. Segmentally poorly delineated endometrium, however, appears asymmetrically focally thickened within the mid body uterus measuring up to 13-14 mm. Correlation with serial beta HCG levels is recommended to exclude the possibility of retained products of conception. 3. Nonvisualization of the left ovary. Electronically Signed   By: Helyn Numbers M.D.   On: 04/11/2023 00:16     MAU Course/MDM: I have reviewed the triage vital signs and the nursing  notes.   Pertinent labs & imaging results that were available during my care of the patient were reviewed by me and considered in my medical decision making (see chart for details).      I have reviewed her medical records including past results, notes and treatments.   I have ordered labs as follows:  CBC which showed stable hemoglobin since delivery.  Imaging ordered: Thickened endometrium, ?retained tissue Results reviewed.   Consult Dr Despina Hidden. He recommends Cytotec, antibiotic for possible endometritis (Clindamycin ordered due to PCN allergy) and analgesia. .   Treatments in MAU included Excedrin Tension for headache and ibuprofen for pelvic pain.   Pt stable at time of discharge.  Assessment: Postpartum bleeding, possible retained products of conception Postpartum endometritis  Plan: Discharge home Recommend Call OB in [facility] in AM to update status Rx sent for Cytotec for retained POC Rx ibuprofen for pain Rx Clindamycin for possible endometritis  Encouraged to return here or to other Urgent Care/ED if she develops worsening of symptoms, increase in pain, fever, or other concerning symptoms.   Wynelle Bourgeois CNM, MSN Certified Nurse-Midwife 04/10/2023 10:59 PM

## 2023-04-10 NOTE — MAU Note (Signed)
.  Jordan Hanson is a 28 y.o. at [redacted]w[redacted]d here in MAU reporting:   EMS arrival at 2257  M Williams CNM at bedside during EMS arrival   [facility] vag delivery at [facility] on Sunday night. Reports uncomplicated pregnancy and delivery. Was discharged on Monday afternoon. Reports has been having dark red vaginal bleeding since discharge with malodor beginning yesterday. Had 3 large clots come out Monday night, also reports burning with urination. Reports had laceration from vaginal delivery with stitches in place. Today reports passed one large clot with smaller clots. Reports has not been changing pad but believes should be changed every hour. Reports after church today began feeling bad, feeling weak, having chills and reports was told by family member that she looked pale. Denies episode of syncope or losing consciousness. Reports abdominal cramping starting in MAU  Also reports HA 10/10, reports floaters when dialing EMS but denies at this time.   Tylenol at around 1600, 500 mg.    Onset of complaint: Monday night  Pain score: 6/10 abd cramping  Vitals:   04/10/23 2300 04/10/23 2305  BP: 130/78   Pulse: 74   Temp: 98.6 F (37 C)   SpO2: 99% 99%     FHT: n/a   Lab orders placed from triage: n/a

## 2023-04-11 DIAGNOSIS — N852 Hypertrophy of uterus: Secondary | ICD-10-CM | POA: Diagnosis not present

## 2023-04-11 DIAGNOSIS — O8612 Endometritis following delivery: Secondary | ICD-10-CM | POA: Diagnosis not present

## 2023-04-11 DIAGNOSIS — N939 Abnormal uterine and vaginal bleeding, unspecified: Secondary | ICD-10-CM | POA: Diagnosis not present

## 2023-04-11 DIAGNOSIS — N854 Malposition of uterus: Secondary | ICD-10-CM | POA: Diagnosis not present

## 2023-04-11 MED ORDER — IBUPROFEN 600 MG PO TABS: 600.0000 mg | ORAL_TABLET | Freq: Four times a day (QID) | ORAL | 1 refills | Status: DC | PRN

## 2023-04-11 MED ORDER — MISOPROSTOL 200 MCG PO TABS: 400.0000 ug | ORAL_TABLET | Freq: Three times a day (TID) | ORAL | 0 refills | Status: DC

## 2023-04-11 MED ORDER — IBUPROFEN 600 MG PO TABS
600.0000 mg | ORAL_TABLET | Freq: Once | ORAL | Status: AC
  Administered 2023-04-11: 600 mg via ORAL
  Filled 2023-04-11: qty 1

## 2023-04-11 MED ORDER — CLINDAMYCIN HCL 300 MG PO CAPS: 600.0000 mg | ORAL_CAPSULE | Freq: Three times a day (TID) | ORAL | 0 refills | Status: AC

## 2023-09-08 ENCOUNTER — Encounter (HOSPITAL_COMMUNITY): Payer: Self-pay | Admitting: Emergency Medicine

## 2023-09-08 ENCOUNTER — Emergency Department (HOSPITAL_COMMUNITY)
Admission: EM | Admit: 2023-09-08 | Discharge: 2023-09-09 | Disposition: A | Discharge status: Other Acute Inpatient Hospital | Source: Home / Self Care | Attending: Emergency Medicine | Admitting: Emergency Medicine

## 2023-09-08 ENCOUNTER — Other Ambulatory Visit: Payer: Self-pay

## 2023-09-08 DIAGNOSIS — R531 Weakness: Secondary | ICD-10-CM | POA: Diagnosis not present

## 2023-09-08 DIAGNOSIS — F332 Major depressive disorder, recurrent severe without psychotic features: Secondary | ICD-10-CM | POA: Diagnosis present

## 2023-09-08 DIAGNOSIS — R9431 Abnormal electrocardiogram [ECG] [EKG]: Secondary | ICD-10-CM | POA: Diagnosis not present

## 2023-09-08 DIAGNOSIS — F32A Depression, unspecified: Secondary | ICD-10-CM | POA: Diagnosis not present

## 2023-09-08 DIAGNOSIS — F329 Major depressive disorder, single episode, unspecified: Secondary | ICD-10-CM | POA: Insufficient documentation

## 2023-09-08 DIAGNOSIS — Z046 Encounter for general psychiatric examination, requested by authority: Secondary | ICD-10-CM | POA: Diagnosis not present

## 2023-09-08 DIAGNOSIS — R259 Unspecified abnormal involuntary movements: Secondary | ICD-10-CM | POA: Diagnosis not present

## 2023-09-08 DIAGNOSIS — Z7689 Persons encountering health services in other specified circumstances: Secondary | ICD-10-CM

## 2023-09-08 NOTE — ED Provider Notes (Signed)
 [place] EMERGENCY DEPARTMENT AT [facility] Provider Note   CSN: [id number] Arrival date & time: 09/08/23  1805     Patient presents with: Medical Clearance and Mental Health Problem   Jordan Hanson is a 28 y.o. female with PMHx ADHD, bipolar disorder, depression, migraines who presents to ED for medical clearance. Patient stating that her husband completed suicide by shooting himself in the head last week infront of the patient. Patient stating that she is grieving and has been drinking a lot of alcohol over the past week.  Patient also stating that she took four 0.5 Klonopin over the span of 6 hours earlier today.  Patient stating that she was getting out of her car to go back into her apartment when she became nauseous and dizzy.  Patient vomited and then laid on the ground for couple minutes.  Patient stating that her neighbor saw her lying on the ground and called EMS.  Patient did eventually get up and walk back into her apartment.  EMS then arrived and asked patient to come be evaluated at the emergency room. Patient stating  Patient has 2 relatives at bedside who states that patient is only grieving and should not be here in the hospital currently.     Mental Health Problem      Prior to Admission medications   Medication Sig Start Date End Date Taking? Authorizing Provider  acetaminophen  (TYLENOL ) 500 MG tablet Take 500 mg by mouth every 6 (six) hours as needed.    [provider]  guaiFENesin  (MUCINEX ) 600 MG 12 hr tablet Take 1 tablet (600 mg total) by mouth 2 (two) times daily. 08/30/22   Trudy Earnie CROME, CNM  ibuprofen  (ADVIL ) 600 MG tablet Take 1 tablet (600 mg total) by mouth every 6 (six) hours as needed. 04/11/23   Trudy Earnie CROME, CNM  metroNIDAZOLE  (FLAGYL ) 500 MG tablet Take 1 tablet (500 mg total) by mouth 2 (two) times daily. 08/10/22   Synthia Raisin, CNM  misoprostol  (CYTOTEC ) 200 MCG tablet Take 2 tablets (400 mcg total) by mouth every 8  (eight) hours for 3 days. Place two tablets in the vagina the night prior to your next clinic appointment 04/11/23 04/14/23  Trudy Earnie CROME, CNM  cetirizine  (ZYRTEC  ALLERGY) 10 MG tablet Take 1 tablet (10 mg total) by mouth daily. Patient not taking: Reported on 07/30/2020 12/17/19 08/01/20  Stuart Vernell Norris, PA-C  fluticasone  (FLONASE ) 50 MCG/ACT nasal spray Place 1 spray into both nostrils daily. Patient not taking: Reported on 07/30/2020 12/17/19 08/01/20  Stuart Vernell Norris, PA-C    Allergies: Geodon  [ziprasidone  hcl], Penicillins, and Tamsulosin     Review of Systems  Psychiatric/Behavioral:         Depression    Updated Vital Signs BP 113/75   Pulse 83   Temp 98.1 F (36.7 C) (Oral)   Resp 18   Ht 5' 5 (1.651 m)   Wt 71.7 kg   SpO2 100%   BMI 26.29 kg/m   Physical Exam Vitals and nursing note reviewed.  Constitutional:      General: She is not in acute distress.    Appearance: She is not ill-appearing or toxic-appearing.  HENT:     Head: Normocephalic and atraumatic.  Eyes:     General: No scleral icterus.       Right eye: No discharge.        Left eye: No discharge.     Conjunctiva/sclera: Conjunctivae normal.  Cardiovascular:  Rate and Rhythm: Normal rate.  Pulmonary:     Effort: Pulmonary effort is normal.  Abdominal:     General: Abdomen is flat.  Skin:    General: Skin is warm and dry.  Neurological:     General: No focal deficit present.     Mental Status: She is alert. Mental status is at baseline.  Psychiatric:        Mood and Affect: Mood normal.        Behavior: Behavior normal.     (all labs ordered are listed, but only abnormal results are displayed) Labs Reviewed - No data to display  EKG: None  Radiology: No results found.   Procedures   Medications Ordered in the ED - No data to display                                  Medical Decision Making  This patient presents to the ED for psych evaluation, this involves an  extensive number of treatment options, and is a complaint that carries with it a high risk of complications and morbidity.  The differential diagnosis includes primary psychosis, substance-induced psychosis, mood disturbance, SI/HI.   Co morbidities that complicate the patient evaluation  ADHD, bipolar disorder, depression, migraines    Additional history obtained:  No PCP listed in chart   Problem List / ED Course / Critical interventions / Medication management  Patient presented for psychiatric evaluation. Patient is endorsing grief.  On my initial exam, the pt was linear in thought, appropriate in affect, and overall well-appearing. Vital signs reviewed and reassuring. With the patient's presentation of Grief, patient warrants emergent psychiatric consultation.  Unsure if patient needs to be admitted, but she is showing some poor insight and has a lot a justified grief along with taking care of a child at home. I would like patient to talk to the psych team to be cleared before going home. Patient is voluntary at this time. No IVC. May need to be reassessed if capacity is changing.  We will hold on labs unless the psych team requests them. I have reviewed the patients home medicines and have made adjustments as needed  Social Determinants of Health:  none  8:24 PM Care of [name]  transferred to [facility] at the end of my shift as the patient will require reassessment once labs/imaging have resulted. Patient presentation, ED course, and plan of care discussed with review of all pertinent labs and imaging. Please see his/her note for further details regarding further ED course and disposition. Plan at time of handoff is reassess patient after psych consult. This may be altered or completely changed at the discretion of the oncoming team pending results of further workup.      Final diagnoses:  None    ED Discharge Orders     None          Jordan Hanson Jordan Hanson 09/08/23 2028    Jordan Jerilynn RAMAN, MD 09/09/23 [phone]

## 2023-09-08 NOTE — ED Notes (Signed)
 ED PA made aware patient keeps attempting to leave.

## 2023-09-08 NOTE — ED Triage Notes (Signed)
 Pt bib EMS from home. Pt witnessed boyfriend kill himself last week. Pt also has a 30 month old baby at home. Pt states she has not been eating and drinking anything but water. Pt states she is taking around 4 clonopin to sleep that she got from a friend. She has been also drinking move alcohol than usual. States she does not endores SI or HI.

## 2023-09-08 NOTE — ED Provider Notes (Incomplete)
 Received patient in signout from previous provider pending TTS consult.  See her note.  In short, patient presents to Emergency Department for medical clearance.  Patient has been drinking alcohol over the last week since her husband committed suicide last week.  Was found laying on the ground and EMS was called by her neighbor return to the emergency department.  Will otbain med clearance labs  Dispo pending TTS. Sign out to [facility] pending medical clearance, TTS consult/recommendations     Minnie Tinnie BRAVO, PA 09/09/23 0018    Rogelia Jerilynn RAMAN, MD 09/11/23 [phone]

## 2023-09-08 NOTE — BH Assessment (Signed)
 Patient was deferred to [facility]IS for a telepsych assessment. The assigned care coordinator will provide updates regarding the scheduling of the assessment. IRIS coordinator can be reached at [phone] for further information on the timing of the telepsych evaluation.

## 2023-09-09 ENCOUNTER — Inpatient Hospital Stay (HOSPITAL_COMMUNITY)
Admission: AD | Admit: 2023-09-09 | Discharge: 2023-09-11 | DRG: 885 | Disposition: A | Discharge status: Home / Self Care | Source: Intra-hospital

## 2023-09-09 ENCOUNTER — Encounter (HOSPITAL_COMMUNITY): Payer: Self-pay | Admitting: Psychiatry

## 2023-09-09 DIAGNOSIS — Z888 Allergy status to other drugs, medicaments and biological substances status: Secondary | ICD-10-CM | POA: Diagnosis not present

## 2023-09-09 DIAGNOSIS — Z880 Allergy status to penicillin: Secondary | ICD-10-CM

## 2023-09-09 DIAGNOSIS — E039 Hypothyroidism, unspecified: Secondary | ICD-10-CM | POA: Diagnosis not present

## 2023-09-09 DIAGNOSIS — Z9151 Personal history of suicidal behavior: Secondary | ICD-10-CM | POA: Diagnosis not present

## 2023-09-09 DIAGNOSIS — Z79899 Other long term (current) drug therapy: Secondary | ICD-10-CM | POA: Diagnosis not present

## 2023-09-09 DIAGNOSIS — F4322 Adjustment disorder with anxiety: Secondary | ICD-10-CM | POA: Diagnosis not present

## 2023-09-09 DIAGNOSIS — F603 Borderline personality disorder: Secondary | ICD-10-CM | POA: Diagnosis not present

## 2023-09-09 DIAGNOSIS — R45851 Suicidal ideations: Secondary | ICD-10-CM | POA: Diagnosis present

## 2023-09-09 DIAGNOSIS — F17210 Nicotine dependence, cigarettes, uncomplicated: Secondary | ICD-10-CM | POA: Diagnosis present

## 2023-09-09 DIAGNOSIS — Z62810 Personal history of physical and sexual abuse in childhood: Secondary | ICD-10-CM | POA: Diagnosis not present

## 2023-09-09 DIAGNOSIS — R9431 Abnormal electrocardiogram [ECG] [EKG]: Secondary | ICD-10-CM | POA: Diagnosis not present

## 2023-09-09 DIAGNOSIS — Z046 Encounter for general psychiatric examination, requested by authority: Secondary | ICD-10-CM | POA: Diagnosis not present

## 2023-09-09 DIAGNOSIS — R259 Unspecified abnormal involuntary movements: Secondary | ICD-10-CM | POA: Diagnosis not present

## 2023-09-09 DIAGNOSIS — F332 Major depressive disorder, recurrent severe without psychotic features: Secondary | ICD-10-CM | POA: Diagnosis not present

## 2023-09-09 LAB — BASIC METABOLIC PANEL WITH GFR
Anion gap: 13 (ref 5–15)
BUN: 10 mg/dL (ref 6–20)
CO2: 24 mmol/L (ref 22–32)
Calcium: 9.5 mg/dL (ref 8.9–10.3)
Chloride: 103 mmol/L (ref 98–111)
Creatinine, Ser: 0.53 mg/dL (ref 0.44–1.00)
GFR, Estimated: >60 mL/min (ref >60)
Glucose, Bld: 98 mg/dL (ref 70–99)
Potassium: 4 mmol/L (ref 3.5–5.1)
Sodium: 140 mmol/L (ref 135–145)

## 2023-09-09 LAB — URINE DRUG SCREEN
Amphetamines: NEGATIVE
Barbiturates: NEGATIVE
Benzodiazepines: POSITIVE — AB
Cocaine: NEGATIVE
Fentanyl: NEGATIVE
Methadone Scn, Ur: NEGATIVE
Opiates: NEGATIVE
Tetrahydrocannabinol: POSITIVE — AB

## 2023-09-09 LAB — CBC
HCT: 37 % (ref 36.0–46.0)
Hemoglobin: 11.4 g/dL — ABNORMAL LOW (ref 12.0–15.0)
MCH: 25 pg — ABNORMAL LOW (ref 26.0–34.0)
MCHC: 30.8 g/dL (ref 30.0–36.0)
MCV: 81.1 fL (ref 80.0–100.0)
Platelets: 332 K/uL (ref 150–400)
RBC: 4.56 MIL/uL (ref 3.87–5.11)
RDW: 17.7 % — ABNORMAL HIGH (ref 11.5–15.5)
WBC: 9.2 K/uL (ref 4.0–10.5)
nRBC: 0 % (ref 0.0–0.2)

## 2023-09-09 LAB — SALICYLATE LEVEL: Salicylate Lvl: <7 mg/dL — ABNORMAL LOW (ref 7.0–30.0)

## 2023-09-09 LAB — ETHANOL: Alcohol, Ethyl (B): <15 mg/dL (ref <15)

## 2023-09-09 LAB — ACETAMINOPHEN LEVEL: Acetaminophen (Tylenol), Serum: <10 ug/mL — ABNORMAL LOW (ref 10–30)

## 2023-09-09 MED ORDER — HALOPERIDOL 5 MG PO TABS: 5.0000 mg | ORAL_TABLET | Freq: Three times a day (TID) | ORAL | Status: DC | PRN

## 2023-09-09 MED ORDER — DIPHENHYDRAMINE HCL 50 MG/ML IJ SOLN: 50.0000 mg | Freq: Three times a day (TID) | INTRAMUSCULAR | Status: DC | PRN

## 2023-09-09 MED ORDER — BENZTROPINE MESYLATE 1 MG PO TABS: 1.0000 mg | ORAL_TABLET | Freq: Two times a day (BID) | ORAL | Status: DC | PRN

## 2023-09-09 MED ORDER — OLANZAPINE 5 MG PO TABS: 5.0000 mg | ORAL_TABLET | Freq: Four times a day (QID) | ORAL | Status: DC | PRN

## 2023-09-09 MED ORDER — HYDROCORTISONE 1 % EX CREA
TOPICAL_CREAM | Freq: Two times a day (BID) | CUTANEOUS | Status: DC
  Filled 2023-09-09 (×2): qty 28

## 2023-09-09 MED ORDER — HALOPERIDOL LACTATE 5 MG/ML IJ SOLN: 5.0000 mg | Freq: Three times a day (TID) | INTRAMUSCULAR | Status: DC | PRN

## 2023-09-09 MED ORDER — TRAZODONE HCL 50 MG PO TABS
50.0000 mg | ORAL_TABLET | Freq: Every evening | ORAL | Status: DC | PRN
  Administered 2023-09-09 – 2023-09-10 (×2): 50 mg via ORAL
  Filled 2023-09-09 (×2): qty 1

## 2023-09-09 MED ORDER — HALOPERIDOL LACTATE 5 MG/ML IJ SOLN: 10.0000 mg | Freq: Three times a day (TID) | INTRAMUSCULAR | Status: DC | PRN

## 2023-09-09 MED ORDER — DIPHENHYDRAMINE HCL 25 MG PO CAPS: 50.0000 mg | ORAL_CAPSULE | Freq: Three times a day (TID) | ORAL | Status: DC | PRN

## 2023-09-09 MED ORDER — LORAZEPAM 2 MG/ML IJ SOLN: 2.0000 mg | Freq: Three times a day (TID) | INTRAMUSCULAR | Status: DC | PRN

## 2023-09-09 MED ORDER — ALUM & MAG HYDROXIDE-SIMETH 200-200-20 MG/5ML PO SUSP: 30.0000 mL | ORAL | Status: DC | PRN

## 2023-09-09 MED ORDER — ACETAMINOPHEN 325 MG PO TABS: 650.0000 mg | ORAL_TABLET | Freq: Four times a day (QID) | ORAL | Status: DC | PRN

## 2023-09-09 MED ORDER — HYDROXYZINE HCL 25 MG PO TABS
25.0000 mg | ORAL_TABLET | Freq: Three times a day (TID) | ORAL | Status: DC | PRN
  Administered 2023-09-09 – 2023-09-10 (×2): 25 mg via ORAL
  Filled 2023-09-09 (×3): qty 1

## 2023-09-09 MED ORDER — MAGNESIUM HYDROXIDE 400 MG/5ML PO SUSP: 30.0000 mL | Freq: Every day | ORAL | Status: DC | PRN

## 2023-09-09 NOTE — Group Note (Signed)
 Date:  09/09/2023 Time:  4:03 PM  Group Topic/Focus: Transforming your thinking Managing Feelings:   The focus of this group is to identify what feelings patients have difficulty handling and develop a plan to handle them in a healthier way upon discharge.    Participation Level:  Did Not Attend  Participation Quality:    Affect:    Cognitive:    Insight:   Engagement in Group:    Modes of Intervention:    Additional Comments:    Jordan Hanson 09/09/2023, 4:03 PM

## 2023-09-09 NOTE — Plan of Care (Signed)
   Problem: Education: Goal: Knowledge of [place] [facility] Education information/materials will improve Outcome: Progressing Goal: Verbalization of understanding the information provided will improve Outcome: Progressing

## 2023-09-09 NOTE — Progress Notes (Signed)
   09/09/23 2200  Psych Admission Type (Psych Patients Only)  Admission Status Involuntary  Psychosocial Assessment  Patient Complaints Anxiety;Crying spells  Eye Contact Brief  Facial Expression Anxious  Affect Anxious;Preoccupied;Depressed  Speech Logical/coherent  Interaction Assertive  Motor Activity Slow  Appearance/Hygiene In scrubs  Behavior Characteristics Anxious  Mood Depressed;Sad  Thought Ship broker  Content Blaming others  Delusions None reported or observed  Perception WDL  Hallucination None reported or observed  Judgment Poor  Confusion None  Danger to Self  Current suicidal ideation? Denies  Danger to Others  Danger to Others None reported or observed

## 2023-09-09 NOTE — Progress Notes (Signed)
 MHT reported to the writer that while doing rounds, she smelled the cigarette in the pt room. It smelled like the pt had just smoked. Writer and MHT went to the room and did the room search but did not find anything. Charge nurse and [facility] were informed, they went to the room, they also said, it smelled like the pt had just smoked cigarette. Pt asked about smoking in the room, pt stated the smell was from her boyfriend who visited during visitation. Pt was educated on the danger of having a lighter in the room. Pt denied having a lighter nor cigarette. Will continue to monitor.

## 2023-09-09 NOTE — ED Provider Notes (Signed)
 Spoke with patient adopted mother Jordan Hanson, [phone] who is currently caring for patients 51 month old son Jordan Hanson. She states she adopted patient from [place] and patient had a lot of aggressive behaviors and she put patient in foster care. Says she didn't hear from patient until she got pregnant and then patient left again and then she didn't hear back from patient until after she had the baby. She says patient makes her own rules, and if you dont do what patient says she becomes angry and aggressive. She states patient has been in an inpatient psychiatric facility about 6 times for suicidal ideations. Explained to [name], that do to patients risk factors 1. New baby (5 months) 2. Past psychiatric hx 3. witnessed boyfriend kill himself last week, informed [name] that we will be admitting patient to inpatient and she was in agreement. She states she will take care of baby, she states she is a retired Engineer, civil (consulting) and will assist patient when she is discharged.

## 2023-09-09 NOTE — ED Provider Notes (Addendum)
  Physical Exam  BP 113/75   Pulse 83   Temp 98.1 F (36.7 C) (Oral)   Resp 18   Ht 5' 5 (1.651 m)   Wt 71.7 kg   SpO2 100%   BMI 26.29 kg/m   Physical Exam Vitals and nursing note reviewed.  Constitutional:      General: She is not in acute distress.    Appearance: She is not toxic-appearing.  HENT:     Head: Normocephalic and atraumatic.  Pulmonary:     Effort: No respiratory distress.  Skin:    Coloration: Skin is not jaundiced or pale.  Neurological:     Mental Status: She is alert and oriented to person, place, and time.  Psychiatric:        Behavior: Behavior normal.     Procedures  Procedures  ED Course / MDM    Medical Decision Making Amount and/or Complexity of Data Reviewed Labs: ordered. ECG/medicine tests: ordered.   Patient signed out to me at shift change by previous provider.  Please see previous provider note for further details.  In short, 28 year old female who recently witnessed her husband commit suicide.  Presents due to being found intoxicated in the street today after consuming Klonopin that she received from a friend.  No social support in the area apparently.  Patient also with 71-month-old at home.  Unsafe situation.  Patient was ultimately IVC by the previous attending Dr. Rogelia.  She is awaiting medical clearance, TTS consultation and disposition.  No complaint at this time.  Update: Patient lab resulted.  Patient labs gross unremarkable.  CBC without leukocytosis or anemia.  Metabolic panel grossly remarkable.  Ethanol, acetaminophen , salicylate undetectable.  Patient medically cleared at this time for TTS disposition.  Update: At end of shift, TTS evaluation not complete.   Update: TTS has evaluated the patient and recommends inpatient psychiatric admission.  Patient will be placed into boarder status.  Diet has been ordered.  Meds ordered.  IVC, first exam filled out.          Jordan Lonni FALCON, PA-C 09/09/23 0631     Jordan Ozell HERO, MD 09/09/23 [phone]

## 2023-09-09 NOTE — ED Notes (Signed)
[facility] called for transport to [facility] 

## 2023-09-09 NOTE — Progress Notes (Signed)
 Pt has been accepted to [facility] on 09/09/2023 Bed assignment: 304-01  Pt meets inpatient criteria per: Kathryne Mardi PIETY   Attending Physician will be: Dr. Prentis    Report can be called to: unit: Adult unit: [phone]  Pt can arrive after: [facility] will update   Care Team Notified: [facility] RN, Cathaleen Jacobson RN, Ole Jefferson RN   [place] Mamie Diiorio LCSW-A   09/09/2023 10:31 AM

## 2023-09-09 NOTE — Tx Team (Signed)
 Initial Treatment Plan 09/09/2023 2:51 PM Jordan Hanson [id number]    PATIENT STRESSORS: Loss of husband     PATIENT STRENGTHS: Average or above average intelligence  Work skills    PATIENT IDENTIFIED PROBLEMS:   I watched my boyfriend commit suicide by shooting himself    I am grieving    I took four 0.5mg  Klonopin over the span of 6 hours and got nauseous and dizzy    I do not need to be here I need to go home so I can be with my child and grieve       DISCHARGE CRITERIA:  Improved stabilization in mood, thinking, and/or behavior Safe-care adequate arrangements made  PRELIMINARY DISCHARGE PLAN: Outpatient therapy  PATIENT/FAMILY INVOLVEMENT: This treatment plan has been presented to and reviewed with the patient, Jordan Hanson. The patient has been given the opportunity to ask questions and make suggestions.  Shanda BIRCH Lutisha Knoche, RN 09/09/2023, 2:51 PM

## 2023-09-09 NOTE — [facility] Group Notes (Signed)
 Adult Psychoeducational Group Note  Date:  09/09/2023 Time:  10:48 PM  Group Topic/Focus:  Wrap-Up Group:   The focus of this group is to help patients review their daily goal of treatment and discuss progress on daily workbooks.  Participation Level:  Active  Participation Quality:  Appropriate  Affect:  Appropriate  Cognitive:  Alert  Insight: Appropriate  Engagement in Group:  Engaged  Modes of Intervention:  Discussion  Additional Comments:  Patient attended and participated in the Wrap-up.  Jordan Hanson 09/09/2023, 10:48 PM

## 2023-09-09 NOTE — ED Notes (Addendum)
 Patient was explain the process, about dressing out and visitor's hours, food from outside not being allowed. Patient calm and cooperative.

## 2023-09-09 NOTE — Plan of Care (Signed)
   Problem: Education: Goal: Knowledge of [facility] Education information/materials will improve Outcome: Progressing   Problem: Activity: Goal: Interest or engagement in activities will improve Outcome: Progressing   Problem: Coping: Goal: Ability to verbalize frustrations and anger appropriately will improve Outcome: Progressing   Problem: Safety: Goal: Periods of time without injury will increase Outcome: Progressing

## 2023-09-09 NOTE — ED Notes (Signed)
Pt was given items to take a shower.  
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
## 2023-09-09 NOTE — ED Notes (Signed)
 Patient and brother were asking questions as to when she could leave    she was advised that she is under an IVC order and that arrangements have been made to be transferred to [facility] later today    they demanded to talk to someone   Cathaleen and Chesley were notified and they had conversations with them both

## 2023-09-09 NOTE — ED Provider Notes (Signed)
 Emergency Medicine Observation Re-evaluation Note  Jordan Hanson is a 28 y.o. female, seen on rounds today.  Pt initially presented to the ED for complaints of Medical Clearance and Mental Health Problem Currently, the patient is resting.  Physical Exam  BP 118/78   Pulse 68   Temp 98.3 F (36.8 C)   Resp 17   Ht 5' 5 (1.651 m)   Wt 71.7 kg   SpO2 96%   BMI 26.29 kg/m  Physical Exam General: Calm Cardiac: Well perfused Lungs: Even respirations Psych: Calm  ED Course / MDM  EKG:EKG Interpretation Date/Time:  Monday September 09 2023 00:48:52 EDT Ventricular Rate:  71 PR Interval:  179 QRS Duration:  108 QT Interval:  386 QTC Calculation: 420 R Axis:   86  Text Interpretation: Sinus rhythm Probable left ventricular hypertrophy with large QRS complexes, predominantly in anterior and inferior leads similar in comparison to prior No significant change since last tracing Confirmed by Rogelia Satterfield ([id number]) on 09/09/2023 1:02:38 AM  I have reviewed the labs performed to date as well as medications administered while in observation.  Recent changes in the last 24 hours include patient accepted to [facility]. Dr. Cloretta is the accepting MD.   Plan  Current plan is for admit to [facility].    Darra Fonda MATSU, MD 09/09/23 1357
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
## 2023-09-09 NOTE — ED Notes (Addendum)
 Patient given soda and water

## 2023-09-09 NOTE — ED Notes (Addendum)
 Patient is dressed out, Family friend took patient's belongings.

## 2023-09-10 ENCOUNTER — Telehealth (HOSPITAL_COMMUNITY): Payer: Self-pay | Admitting: Pharmacy Technician

## 2023-09-10 ENCOUNTER — Other Ambulatory Visit (HOSPITAL_COMMUNITY): Payer: Self-pay

## 2023-09-10 DIAGNOSIS — F332 Major depressive disorder, recurrent severe without psychotic features: Principal | ICD-10-CM

## 2023-09-10 MED ORDER — NICOTINE POLACRILEX 2 MG MT GUM
2.0000 mg | CHEWING_GUM | OROMUCOSAL | Status: DC | PRN
  Administered 2023-09-10: 2 mg via ORAL

## 2023-09-10 NOTE — Group Note (Signed)
 Date:  09/10/2023 Time:  9:25 AM  Group Topic/Focus:  Goals Group:   The focus of this group is to help patients establish daily goals to achieve during treatment and discuss how the patient can incorporate goal setting into their daily lives to aide in recovery.    Participation Level:  Did Not Attend  Participation Quality:  na  Affect:  na  Cognitive:  na  Insight: None  Engagement in Group:  na  Modes of Intervention:  na  Additional Comments:  na  Nat Rummer 09/10/2023, 9:25 AM
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
## 2023-09-10 NOTE — [facility] Group Notes (Signed)
 [facility] Group Notes:  (Nursing/MHT/Case Management/Adjunct)  Date:  09/10/2023  Time:  9:28 PM  Type of Therapy:  Wrap-up group  Participation Level:  Active  Participation Quality:  Appropriate  Affect:  Appropriate  Cognitive:  Appropriate  Insight:  Appropriate  Engagement in Group:  Engaged  Modes of Intervention:  Education  Summary of Progress/Problems: Goal to stay positive. Rated day 10/10.  Jordan Hanson [place] 09/10/2023, 9:28 PM

## 2023-09-10 NOTE — Progress Notes (Signed)
 D: Patient is alert, oriented, and cooperative. Denies SI, HI, AVH, and verbally contracts for safety. Patient denies physical symptoms/pain.    A: Scheduled medications administered per MD order. PRN nicotine  gum administered. Support provided. Patient educated on safety on the unit and medications. Routine safety checks every 15 minutes. Patient stated understanding to tell nurse about any new physical symptoms. Patient understands to tell staff of any needs.     R: No adverse drug reactions noted. Patient remains safe at this time and will continue to monitor.    09/10/23 1000  Psych Admission Type (Psych Patients Only)  Admission Status Involuntary  Psychosocial Assessment  Patient Complaints Anxiety  Eye Contact Fair  Facial Expression Anxious  Affect Preoccupied  Speech Logical/coherent  Interaction Assertive  Motor Activity Other (Comment) (WNL)  Appearance/Hygiene Unremarkable  Behavior Characteristics Anxious  Mood Depressed;Anxious  Thought Process  Coherency Tangential  Content Blaming others  Delusions None reported or observed  Perception WDL  Hallucination None reported or observed  Judgment Poor  Confusion None  Danger to Self  Current suicidal ideation? Denies  Danger to Others  Danger to Others None reported or observed

## 2023-09-10 NOTE — Telephone Encounter (Signed)
 Patient Product/process development scientist completed.    The patient is insured through [facility].     Ran test claim for Abilify Maintena 400 mg and Requires Prior Authorization  Ran test claim for Invega Sustenna 156 mg mg and Requires Prior Authorization  Ran test claim for Uzedy 75 mg and Requires Prior Authorization  This test claim was processed through [employer]- copay amounts may vary at other pharmacies due to [employer], or as the patient moves through the different stages of their insurance plan.     Reyes Sharps, CPHT Pharmacy Technician III Certified Patient Advocate [facility] Patient Advocate Team Direct Number: [phone]  Fax: [phone]

## 2023-09-10 NOTE — Group Note (Signed)
 LCSW Group Therapy Note   Group Date: 09/10/2023 Start Time: 1100 End Time: 1200   Participation:  patient was present   Type of Therapy:  Group Therapy  Topic:  Shining from Within:  Confidence and Self-Love Journey   Objective:  To support participants in developing confidence and self-love through self-awareness, self-compassion, and practical skills that nurture personal growth.   Group Goals Encourage self-reflection and self-acceptance by identifying personal strengths and achievements. Teach skills to challenge negative self-talk and replace it with supportive, truthful self-talk. Foster resilience and self-worth through [employer], gratitude, and self-care practices.   Summary:  This group explores the connection between confidence and self-love by guiding participants through reflection, mindset shifts, and practical tools like affirmations, strength recognition, and goal-setting. Activities are designed to promote self-compassion, build emotional resilience, and normalize the slow, patient journey of inner growth.   Therapeutic Modalities Used Cognitive Behavioral Therapy (CBT): Challenging and reframing unhelpful self-talk. Motivational Interviewing (MI): Encouraging small, achievable goals. Elements of Dialectical Behavioral Therapist (DBT):  Mindfulness and Self-Compassion: Promoting present-moment awareness and kindness toward self.   Emmalina Espericueta O Marice Guidone, LCSWA 09/10/2023  5:23 PM

## 2023-09-10 NOTE — Transportation (Signed)
 09/10/2023  Nolberto KATHEE Sharps DOB: [birth year] MRN: [id number]   RIDER WAIVER AND RELEASE OF LIABILITY  For the purposes of helping with transportation needs, [facility] partners with outside transportation providers (taxi companies, [place], Catering manager.) to give [place] patients or other approved people the choice of on-demand rides Public librarian) to our buildings for non-emergency visits.  By using [employer], I, the person signing this document, on behalf of myself and/or any legal minors (in my care using the [employer]), agree:  Science writer given to me are supplied by independent, outside transportation providers who do not work for, or have any affiliation with, [employer]. [place] is not a transportation company. [place] has no control over the quality or safety of the rides I get using [employer]. [place] has no control over whether any outside ride will happen on time or not. [place] gives no guarantee on the reliability, quality, safety, or availability on any rides, or that no mistakes will happen. I know and accept that traveling by vehicle (car, truck, SVU, fleeta, bus, taxi, etc.) has risks of serious injuries such as disability, being paralyzed, and death. I know and agree the risk of using [employer] is mine alone, and not [employer]. [employer] are provided as is and as are available. The transportation providers are in charge for all inspections and care of the vehicles used to provide these rides. I agree not to take legal action against [place], its agents, employees, officers, directors, representatives, insurers, attorneys, assigns, successors, subsidiaries, and affiliates at any time for any reasons related directly or indirectly to using [employer]. I also agree not to take legal action against [place] or its affiliates for any injury, death, or damage to property caused by or related to using  [employer]. I have read this Waiver and Release of Liability, and I understand the terms used in it and their legal meaning. This Waiver is freely and voluntarily given with the understanding that my right (or any legal minors) to legal action against [place] relating to [employer] is knowingly given up to use these services.   I attest that I read the Ride Waiver and Release of Liability to Phelan B Andress, gave Ms. Kiely the opportunity to ask questions and answered the questions asked (if any). I affirm that Nolberto KATHEE Sharps then provided consent for assistance with transportation.

## 2023-09-10 NOTE — Group Note (Signed)
 Recreation Therapy Group Note   Group Topic:Animal Assisted Therapy   Group Date: 09/10/2023 Start Time: 0945 End Time: 1030 Facilitators: Ryker Sudbury-McCall, LRT,CTRS Location: [address] Dayroom   Animal-Assisted Activity (AAA) Program Checklist/Progress Notes Patient Eligibility Criteria Checklist & Daily Group note for Rec Tx Intervention  AAA/T Program Assumption of Risk Form signed by Patient/ or Parent Legal Guardian Yes  Patient is free of allergies or severe asthma Yes  Patient reports no fear of animals Yes  Patient reports no history of cruelty to animals Yes  Patient understands his/her participation is voluntary Yes  Patient washes hands before animal contact Yes  Patient washes hands after animal contact Yes  Behavioral Response: Engaged   Education: Charity fundraiser, Appropriate Animal Interaction   Education Outcome: Acknowledges education.    Affect/Mood: Appropriate   Participation Level: Engaged   Participation Quality: Independent   Behavior: Appropriate   Speech/Thought Process: Focused   Insight: Good   Judgement: Good   Modes of Intervention: Teaching laboratory technician   Patient Response to Interventions:  Engaged   Education Outcome:  In group clarification offered    Clinical Observations/Individualized Feedback: Patient attended session and interacted appropriately with therapy dog and peers. Patient asked appropriate questions about therapy dog and his training. Patient shared stories about their pets at home with group.     Plan: Continue to engage patient in RT group sessions 2-3x/week.   Jahnae Mcadoo-McCall, LRT,CTRS  09/10/2023 12:41 PM

## 2023-09-10 NOTE — Progress Notes (Signed)
(  Sleep Hours) - 5.25 (Any PRNs that were needed, meds refused, or side effects to meds)- Vistaril , Trazodone  (Any disturbances and when (visitation, over night)- Room smelled like she smoked, but denied when asked. (Concerns raised by the patient)- none (SI/HI/AVH)- Denies

## 2023-09-10 NOTE — BHH Suicide Risk Assessment (Signed)
 BHH INPATIENT:  Family/Significant Other Suicide Prevention Education  Suicide Prevention Education:  Education Completed; Jazelyn Sipe (mother) [phone],  (name of family member/significant other) has been identified by the patient as the family member/significant other with whom the patient will be residing, and identified as the person(s) who will aid the patient in the event of a mental health crisis (suicidal ideations/suicide attempt).  With written consent from the patient, the family member/significant other has been provided the following suicide prevention education, prior to the and/or following the discharge of the patient.   The suicide prevention education provided includes the following: Suicide risk factors Suicide prevention and interventions National Suicide Hotline telephone number [facility] assessment telephone number [facility] Emergency Assistance 911 [facility] and/or Residential Mobile Crisis Unit telephone number  Request made of family/significant other to: Remove weapons (e.g., guns, rifles, knives), all items previously/currently identified as safety concern.   Remove drugs/medications (over-the-counter, prescriptions, illicit drugs), all items previously/currently identified as a safety concern.  [name] is not aware of any presence of weapons in patient's home and agrees to help manage/safely store away medication upon discharge. [name] also is aware of who to call in the event of a mental health crisis.   The family member/significant other verbalizes understanding of the suicide prevention education information provided.  The family member/significant other agrees to remove the items of safety concern listed above.  Louetta Lame 09/10/2023, 2:50 PM

## 2023-09-10 NOTE — Group Note (Signed)
 Date:  09/10/2023 Time:  3:57 PM  Group Topic/Focus: Discuss methods to help fall asleep and wake up Self Care:   The focus of this group is to help patients understand the importance of self-care in order to improve or restore emotional, physical, spiritual, interpersonal, and financial health.    Participation Level:  Active  Participation Quality:  Redirectable and Sharing  Affect:  Labile  Cognitive:  Alert  Insight: Improving  Engagement in Group:  Engaged  Modes of Intervention:  Discussion  Additional Comments:    Patient was engaged in group and required minimal redirection.   Evrett Hakim D Glover Capano 09/10/2023, 3:57 PM

## 2023-09-10 NOTE — Plan of Care (Signed)
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
## 2023-09-10 NOTE — BHH Suicide Risk Assessment (Signed)
 Suicide Risk Assessment  Admission Assessment    [facility] Admission Suicide Risk Assessment   Nursing information obtained from:  Patient Demographic factors:  Caucasian, Divorced or widowed Current Mental Status:  NA Loss Factors:  Loss of significant relationship Historical Factors:  Impulsivity Risk Reduction Factors:  Responsible for children under 30 years of age, Employed  Total Time spent with patient: 30 minutes Principal Problem: MDD (major depressive disorder), recurrent severe, without psychosis (HCC) Diagnosis:  Principal Problem:   MDD (major depressive disorder), recurrent severe, without psychosis (HCC)  Subjective Data: See H&P  Continued Clinical Symptoms:  Alcohol Use Disorder Identification Test Final Score (AUDIT): 3 The Alcohol Use Disorders Identification Test, Guidelines for Use in Primary Care, Second Edition.  World Science writer [facility]). Score between 0-7:  no or low risk or alcohol related problems. Score between 8-15:  moderate risk of alcohol related problems. Score between 16-19:  high risk of alcohol related problems. Score 20 or above:  warrants further diagnostic evaluation for alcohol dependence and treatment.   CLINICAL FACTORS:   {Clinical Factors:22706}   Musculoskeletal: Strength & Muscle Tone: within normal limits Gait & Station: normal Patient leans: N/A  Psychiatric Specialty Exam:  Presentation  General Appearance: No data recorded Eye Contact:No data recorded Speech:No data recorded Speech Volume:No data recorded Handedness:No data recorded  Mood and Affect  Mood:No data recorded Affect:No data recorded  Thought Process  Thought Processes:No data recorded Descriptions of Associations:No data recorded Orientation:No data recorded Thought Content:No data recorded History of Schizophrenia/Schizoaffective disorder:No  Duration of Psychotic Symptoms:No data recorded Hallucinations:No data recorded Ideas of Reference:No  data recorded Suicidal Thoughts:No data recorded Homicidal Thoughts:No data recorded  Sensorium  Memory:No data recorded Judgment:No data recorded Insight:No data recorded  Executive Functions  Concentration:No data recorded Attention Span:No data recorded Recall:No data recorded Fund of Knowledge:No data recorded Language:No data recorded  Psychomotor Activity  Psychomotor Activity:No data recorded  Assets  Assets:No data recorded  Sleep  Sleep:No data recorded   Physical Exam: Physical Exam ROS Blood pressure 110/76, pulse 79, temperature 98.3 F (36.8 C), temperature source Oral, resp. rate 16, height 5' 5 (1.651 m), weight 60.8 kg, SpO2 100%, not currently breastfeeding. Body mass index is 22.3 kg/m.   COGNITIVE FEATURES THAT CONTRIBUTE TO RISK:  Closed-mindedness    SUICIDE RISK:   Severe:  Frequent, intense, and enduring suicidal ideation, specific plan, no subjective intent, but some objective markers of intent (i.e., choice of lethal method), the method is accessible, some limited preparatory behavior, evidence of impaired self-control, severe dysphoria/symptomatology, multiple risk factors present, and few if any protective factors, particularly a lack of social support.  PLAN OF CARE: See H&P  I certify that inpatient services furnished can reasonably be expected to improve the patient's condition.   Blair Chiquita Hint, NP 09/10/2023, 8:16 AM

## 2023-09-10 NOTE — Progress Notes (Signed)
  [facility] Adult Case Management Discharge Plan :  Will you be returning to the same living situation after discharge:  Yes,  patient will be returning to her home located at [address] Jordan Hanson [place], [place] [address] At discharge, do you have transportation home?: No. CSW arranged transportation with a taxi voucher through [facility] for 8:00 AM 09/11/23.  Do you have the ability to pay for your medications: Yes,  patient has active health insurance.   Release of information consent forms completed and in the chart;  Patient's signature needed at discharge.  Patient to Follow up at:  Follow-up Information     [place] Go on 09/16/2023.   Specialty: Professional Counselor Why: Please go to this provider for an assessment, to obtain interim therapy services on 09/16/23 at 9:00 am.  You may also go Monday through Friday, from 9 am to 1 pm. Contact information: [address] [place]  [address] [place] [address] [phone]         [facility] Go on 10/02/2023.   Why: You have an appointment for medication management services on 10/02/23 at 11:00 am. The appointment will be held in person, but you may call to switch to Virtual. Contact information: [address] [place] [address] [phone]         [facility]. Schedule an appointment as soon as possible for a visit.   Specialty: Hospice and Palliative Medicine Why: Please call this provider personally to schedule an appointment for grief/bereavement therapy services. Contact information: [address] [employer] [address] [place]  [address] [phone]        [facility]. Go on 11/13/2023.   Specialty: Behavioral Health Why: You have an appointment for therapy services on 11/13/23 at 11:00 am with  [facility].  The appointment will be held in person. Contact information: [address] [place]  [address] [phone]                Next  level of care provider has access to [facility] Link:no  Safety Planning and Suicide Prevention discussed: Yes,  completed with Andreal Vultaggio (mother) [phone].      Has patient been referred to the Quitline?: Patient refused referral for treatment  Patient has been referred for addiction treatment: No known substance use disorder.  Louetta Lame, LCSWA 09/10/2023, 4:20 PM

## 2023-09-11 DIAGNOSIS — F332 Major depressive disorder, recurrent severe without psychotic features: Secondary | ICD-10-CM | POA: Diagnosis not present

## 2023-09-11 LAB — VALPROIC ACID LEVEL: Valproic Acid Lvl: <10 ug/mL — ABNORMAL LOW (ref 50–100)

## 2023-09-11 LAB — TSH: TSH: 2.24 u[IU]/mL (ref 0.350–4.500)

## 2023-09-11 LAB — HEMOGLOBIN A1C
Hgb A1c MFr Bld: 4.8 % (ref 4.8–5.6)
Mean Plasma Glucose: 91.06 mg/dL

## 2023-09-11 LAB — LIPID PANEL
Cholesterol: 154 mg/dL (ref 0–200)
HDL: 45 mg/dL (ref >40)
LDL Cholesterol: 94 mg/dL (ref 0–99)
Total CHOL/HDL Ratio: 3.4 ratio
Triglycerides: 73 mg/dL (ref <150)
VLDL: 15 mg/dL (ref 0–40)

## 2023-09-11 LAB — VITAMIN D 25 HYDROXY (VIT D DEFICIENCY, FRACTURES): Vit D, 25-Hydroxy: 36.78 ng/mL (ref 30–100)

## 2023-09-11 MED ORDER — HYDROCORTISONE 1 % EX CREA: TOPICAL_CREAM | Freq: Two times a day (BID) | CUTANEOUS | Status: AC

## 2023-09-11 MED ORDER — NICOTINE POLACRILEX 2 MG MT GUM: 2.0000 mg | CHEWING_GUM | OROMUCOSAL | Status: AC | PRN

## 2023-09-11 MED ORDER — TRAZODONE HCL 50 MG PO TABS: 50.0000 mg | ORAL_TABLET | Freq: Every evening | ORAL | 0 refills | Status: AC | PRN

## 2023-09-11 MED ORDER — HYDROXYZINE HCL 25 MG PO TABS: 25.0000 mg | ORAL_TABLET | Freq: Three times a day (TID) | ORAL | 0 refills | Status: AC | PRN

## 2023-09-11 NOTE — Discharge Summary (Signed)
 Physician Discharge Summary Note  Patient:  Jordan Hanson is an 28 y.o., female MRN:  [id number] DOB:  1995/11/20 Patient phone:  [phone] (home)  Patient address:   [address] [place] [facility] [address],  Total Time spent with patient: 30 minutes  Date of Admission:  09/09/2023 Date of Discharge: 09/11/23   Reason for Admission:  Jordan Hanson is a 28 year old female with a psychiatric history significant for ADHD, anxiety, bipolar disorder, depression, and borderline personality disorder.  She was admitted to the [facility] under involuntary status from [name], ED after her neighbor called EMS when she was found lying on the ground outside her home, reportedly intoxicated with alcohol and Klonopin.  Patient endorsed worsening depressive symptoms and reported ingesting 4 doses of Klonopin over 6 hours in addition to alcohol use.  She also disclosed that she witnessed her fianc complete suicide via gunshot wound last week.  Patient is the sole caretaker of her 55-month-old infant.   [name] is planned for discharge home to allow attendance at her significant other's funeral. Recommend trauma-focused therapy and grief counseling. Continue PRN medications (hydroxyzine , trazodone ) for sleep and anxiety. Provide psychoeducation on coping strategies and substance use. Encourage ongoing engagement with social supports and outpatient therapy. She has been referred for counseling services. She is aware of follow-up plans and demonstrates insight into the importance of ongoing treatment and abstaining from alcohol and psychoactive substances. She is stable, future-oriented, and demonstrates insight. Protective factors include her 42-month-old son and support from her mother. No acute safety concerns identified.   Principal Problem: MDD (major depressive disorder), recurrent severe, without psychosis (HCC) Discharge Diagnoses: Principal Problem:   MDD (major depressive disorder),  recurrent severe, without psychosis (HCC)   Past Psychiatric History: See H&P  Past Medical History:  Past Medical History:  Diagnosis Date   ADHD (attention deficit hyperactivity disorder)    Adopted    Anxiety    Bipolar disorder (HCC)    Depression     Past Surgical History:  Procedure Laterality Date   DILATION AND CURETTAGE OF UTERUS  2019   SMALL INTESTINE SURGERY     TONSILLECTOMY     Family History:  Family History  Adopted: Yes   Family Psychiatric  History: As listed above Social History:  Social History   Substance and Sexual Activity  Alcohol Use Not Currently   Alcohol/week: 14.0 standard drinks of alcohol   Types: 14 Cans of beer per week     Social History   Substance and Sexual Activity  Drug Use Not Currently   Types: Marijuana   Comment: 7.0 grams per week    Social History   Socioeconomic History   Marital status: Single    Spouse name: Not on file   Number of children: Not on file   Years of education: Not on file   Highest education level: Not on file  Occupational History   Not on file  Tobacco Use   Smoking status: Former    Current packs/day: 1.00    Average packs/day: 1 pack/day for 6.0 years (6.0 ttl pk-yrs)    Types: Cigarettes   Smokeless tobacco: Never  Vaping Use   Vaping status: Never Used  Substance and Sexual Activity   Alcohol use: Not Currently    Alcohol/week: 14.0 standard drinks of alcohol    Types: 14 Cans of beer per week   Drug use: Not Currently    Types: Marijuana    Comment: 7.0 grams per  week   Sexual activity: Not Currently    Partners: Male    Birth control/protection: Condom, None    Comment: sig other together 4 years  Other Topics Concern   Not on file  Social History Narrative   Not on file   Social Drivers of Health   Financial Resource Strain: Low Risk  (04/07/2023)   Received from [facility]   Overall Financial Resource Strain (CARDIA)    Difficulty of Paying Living Expenses: Not  hard at all  Food Insecurity: No Food Insecurity (09/09/2023)   Hunger Vital Sign    Worried About Running Out of Food in the Last Year: Never true    Ran Out of Food in the Last Year: Never true  Transportation Needs: No Transportation Needs (09/09/2023)   PRAPARE - Administrator, Civil Service (Medical): No    Lack of Transportation (Non-Medical): No  Physical Activity: Sufficiently Active (07/18/2020)   Exercise Vital Sign    Days of Exercise per Week: 7 days    Minutes of Exercise per Session: 60 min  Stress: No Stress Concern Present (04/07/2023)   Received from [facility] - Occupational Stress Questionnaire    Feeling of Stress : Not at all  Social Connections: Unknown (05/11/2021)   Received from [facility]   Social Network    Social Network: Not on file    Hospital Course:  During the patient's hospitalization, patient had extensive initial psychiatric evaluation, and follow-up psychiatric evaluations every day.  Psychiatric diagnoses provided upon initial assessment:  Principal Problem:   MDD (major depressive disorder), recurrent severe, without psychosis (HCC)  Patient reports she has not been on psychiatric medications for approximately three years.   She declines psychiatric medications at this time but consents to PRN hydroxyzine  for anxiety and PRN trazodone  for sleep.   Patient's care was discussed during the interdisciplinary team meeting every day during the hospitalization.  Gradually, patient started adjusting to milieu. The patient was evaluated each day by a clinical provider to ascertain response to treatment. Improvement was noted by the patient's report of decreasing symptoms, improved sleep and appetite, affect, medication tolerance, behavior, and participation in unit programming.  Patient was asked each day to complete a self inventory noting mood, mental status, pain, new symptoms, anxiety and  concerns.    Symptoms were reported as significantly decreased or resolved completely by discharge.   On day of discharge, the patient reports that their mood is stable. The patient denied having suicidal thoughts for more than 48 hours prior to discharge.  Patient denies having homicidal thoughts.  Patient denies having auditory hallucinations.  Patient denies any visual hallucinations or other symptoms of psychosis. The patient was motivated to continue taking medication with a goal of continued improvement in mental health.   The patient reports their target psychiatric symptoms of anxiety, depression, and insomnia responded well to the psychiatric medications, and the patient reports overall benefit other psychiatric hospitalization. Supportive psychotherapy was provided to the patient. The patient also participated in regular group therapy while hospitalized. Coping skills, problem solving as well as relaxation therapies were also part of the unit programming.  Labs were reviewed with the patient, and abnormal results were discussed with the patient.  The patient is able to verbalize their individual safety plan to this provider.  # It is recommended to the patient to continue psychiatric medications as prescribed, after discharge from the hospital.    #  It is recommended to the patient to follow up with your outpatient psychiatric provider and PCP.  # It was discussed with the patient, the impact of alcohol, drugs, tobacco have been there overall psychiatric and medical wellbeing, and total abstinence from substance use was recommended the patient.ed.  # Prescriptions provided or sent directly to preferred pharmacy at discharge. Patient agreeable to plan. Given opportunity to ask questions. Appears to feel comfortable with discharge.    # In the event of worsening symptoms, the patient is instructed to call the crisis hotline, 911 and or go to the nearest ED for appropriate evaluation and  treatment of symptoms. To follow-up with primary care provider for other medical issues, concerns and or health care needs  # Patient was discharged home with a plan to follow up as noted below.   Physical Findings: AIMS:  , ,  , NA  ,  ,  ,   CIWA:   N/A COWS:   N/A  Musculoskeletal: Strength & Muscle Tone: within normal limits Gait & Station: normal Patient leans: N/A   Psychiatric Specialty Exam:  Presentation  General Appearance:  Appropriate for Environment; Casual; Neat  Eye Contact: Good  Speech: Clear and Coherent; Normal Rate  Speech Volume: Normal  Handedness: Right   Mood and Affect  Mood: Euthymic  Affect: Appropriate; Full Range   Thought Process  Thought Processes: Coherent; Goal Directed; Linear  Descriptions of Associations:Intact  Orientation:Full (Time, Place and Person)  Thought Content:Logical  History of Schizophrenia/Schizoaffective disorder:No  Duration of Psychotic Symptoms:No data recorded Hallucinations:Hallucinations: None  Ideas of Reference:None  Suicidal Thoughts:Suicidal Thoughts: No  Homicidal Thoughts:Homicidal Thoughts: No   Sensorium  Memory: Immediate Good; Recent Good; Remote Good  Judgment: Intact  Insight: Present   Executive Functions  Concentration: Good  Attention Span: Good  Recall: Good  Fund of Knowledge: Good  Language: Good   Psychomotor Activity  Psychomotor Activity: Psychomotor Activity: Normal   Assets  Assets: Communication Skills; Desire for Improvement; Financial Resources/Insurance; Housing; Physical Health; Resilience; Social Support; Talents/Skills   Sleep  Sleep: Sleep: Good  Estimated Sleeping Duration (Last 24 Hours): 5.50-6.75 hours   Physical Exam: Physical Exam Vitals and nursing note reviewed.  Constitutional:      General: She is not in acute distress.    Appearance: She is not ill-appearing.  HENT:     Mouth/Throat:     Pharynx:  Oropharynx is clear.  Cardiovascular:     Rate and Rhythm: Normal rate.     Pulses: Normal pulses.  Pulmonary:     Effort: No respiratory distress.  Skin:    Findings: Rash present.  Neurological:     General: No focal deficit present.     Mental Status: She is alert and oriented to person, place, and time. Mental status is at baseline.  Psychiatric:        Mood and Affect: Mood normal.        Behavior: Behavior normal.        Thought Content: Thought content normal.        Judgment: Judgment normal.    Review of Systems  Constitutional: Negative.   HENT: Negative.    Eyes: Negative.   Respiratory: Negative.    Cardiovascular: Negative.   Gastrointestinal: Negative.   Genitourinary: Negative.  Negative for dysuria.  Musculoskeletal: Negative.   Skin:  Positive for rash.  Neurological: Negative.   Psychiatric/Behavioral:  Positive for depression (Stable for lower level of care) and substance abuse (Cessation encouraged).  Negative for hallucinations, memory loss and suicidal ideas. The patient is nervous/anxious (Stable for lower level of care) and has insomnia (Stable for lower level of care).    Blood pressure 138/87, pulse 77, temperature 98.3 F (36.8 C), temperature source Oral, resp. rate 16, height 5' 5 (1.651 m), weight 60.8 kg, SpO2 100%, not currently breastfeeding. Body mass index is 22.3 kg/m.   Social History   Tobacco Use  Smoking Status Former   Current packs/day: 1.00   Average packs/day: 1 pack/day for 6.0 years (6.0 ttl pk-yrs)   Types: Cigarettes  Smokeless Tobacco Never   Tobacco Cessation:  A prescription for an FDA-approved tobacco cessation medication provided at discharge   Blood Alcohol level:  Lab Results  Component Value Date   [facility] <15 09/09/2023   ETH <10 02/15/2021    Metabolic Disorder Labs:  Lab Results  Component Value Date   HGBA1C 4.8 09/11/2023   MPG 91.06 09/11/2023   MPG 103 02/12/2016   Lab Results  Component Value  Date   PROLACTIN 48.7 (H) 02/12/2016   Lab Results  Component Value Date   CHOL 154 09/11/2023   TRIG 73 09/11/2023   HDL 45 09/11/2023   CHOLHDL 3.4 09/11/2023   VLDL 15 09/11/2023   LDLCALC 94 09/11/2023   LDLCALC 101 (H) 06/01/2019    See Psychiatric Specialty Exam and Suicide Risk Assessment completed by Attending Physician prior to discharge.  Discharge destination:  Home  Is patient on multiple antipsychotic therapies at discharge:  No   Has Patient had three or more failed trials of antipsychotic monotherapy by history:  No  Recommended Plan for Multiple Antipsychotic Therapies: NA  Discharge Instructions     Diet - low sodium heart healthy   Complete by: As directed    Increase activity slowly   Complete by: As directed       Allergies as of 09/11/2023       Reactions   Geodon  [ziprasidone  Hcl] Other (See Comments)   Caused manic episode   Penicillins Swelling, Rash   Has patient had a PCN reaction causing immediate rash, facial/tongue/throat swelling, SOB or lightheadedness with hypotension:YES Has patient had a PCN reaction causing severe rash involving mucus membranes or skin necrosis: NO Has patient had a PCN reaction that required hospitalization NO Has patient had a PCN reaction occurring within the last 10 years: NO If all of the above answers are NO, then may proceed with Cephalosporin use.   Tamsulosin  Rash        Medication List     STOP taking these medications    guaiFENesin  600 MG 12 hr tablet Commonly known as: Mucinex    ibuprofen  600 MG tablet Commonly known as: ADVIL    metroNIDAZOLE  500 MG tablet Commonly known as: FLAGYL    misoprostol  200 MCG tablet Commonly known as: CYTOTEC        TAKE these medications      Indication  hydrocortisone  cream 1 % Apply topically 2 (two) times daily.  Indication: Rash   hydrOXYzine  25 MG tablet Commonly known as: ATARAX  Take 1 tablet (25 mg total) by mouth 3 (three) times daily as  needed for anxiety.  Indication: Feeling Anxious   nicotine  polacrilex 2 MG gum Commonly known as: NICORETTE  Take 1 each (2 mg total) by mouth as needed for smoking cessation.  Indication: Nicotine  Addiction   traZODone  50 MG tablet Commonly known as: DESYREL  Take 1 tablet (50 mg total) by mouth at bedtime as needed for sleep.  Indication: Trouble Sleeping        Follow-up Information     [facility] Go on 09/16/2023.   Specialty: Professional Counselor Why: Please go to this provider for an assessment, to obtain interim therapy services on 09/16/23 at 9:00 am.  You may also go Monday through Friday, from 9 am to 1 pm. Contact information: [address]  [address] [place] [address] [phone]         [facility] Go on 10/02/2023.   Why: You have an appointment for medication management services on 10/02/23 at 11:00 am. The appointment will be held in person, but you may call to switch to Virtual. Contact information: [address] [place] [address] [phone]         [facility]. Schedule an appointment as soon as possible for a visit.   Specialty: Hospice and Palliative Medicine Why: Please call this provider personally to schedule an appointment for grief/bereavement therapy services. Contact information: [address] [place]  [address] [phone]        [facility]. Go on 11/13/2023.   Specialty: Behavioral Health Why: You have an appointment for therapy services on 11/13/23 at 11:00 am with  [facility].  The appointment will be held in person. Contact information: [address] [place]  [phone]                Follow-up recommendations:   Activity: as tolerated  Diet: heart healthy  Other: -Follow-up with your outpatient psychiatric provider -instructions on appointment date, time, and address (location) are provided to  you in discharge paperwork.  -Take your psychiatric medications as prescribed at discharge - instructions are provided to you in the discharge paperwork  -Follow-up with outpatient primary care doctor and other specialists -for management of preventative medicine and chronic medical disease  -Testing: Follow-up with outpatient provider for abnormal lab results:   -If you are prescribed an atypical antipsychotic medication, we recommend that your outpatient psychiatrist follow routine screening for side effects within 3 months of discharge, including monitoring: AIMS scale, height, weight, blood pressure, fasting lipid panel, HbA1c, and fasting blood sugar.   -Recommend total abstinence from alcohol, tobacco, and other illicit drug use at discharge.   -If your psychiatric symptoms recur, worsen, or if you have side effects to your psychiatric medications, call your outpatient psychiatric provider, 911, [phone] or go to the nearest emergency department.  -If suicidal thoughts occur, immediately call your outpatient psychiatric provider, 911, [phone] or go to the nearest emergency department.     Signed: Blair Chiquita Hint, NP 09/11/2023, 3:20 PM

## 2023-09-11 NOTE — Progress Notes (Signed)
(  Sleep Hours) - 8 (Any PRNs that were needed, meds refused, or side effects to meds)- PRN vistaril  25 mg and trazodone  50 mg given at pt request, no meds refused.  (Any disturbances and when (visitation, over night)- None  (Concerns raised by the patient)- None  (SI/HI/AVH)- Denies SI/HI/AVH

## 2023-09-11 NOTE — Progress Notes (Signed)
   09/11/23 0053  Psych Admission Type (Psych Patients Only)  Admission Status Involuntary  Psychosocial Assessment  Patient Complaints Anxiety;Depression  Eye Contact Fair  Facial Expression Animated  Affect Anxious  Speech Logical/coherent  Interaction Assertive  Motor Activity Other (Comment) (WDL)  Appearance/Hygiene Unremarkable  Behavior Characteristics Cooperative  Mood Anxious  Thought Process  Coherency WDL  Content WDL  Delusions None reported or observed  Perception WDL  Hallucination None reported or observed  Judgment Impaired  Confusion None  Danger to Self  Current suicidal ideation? Denies  Danger to Others  Danger to Others None reported or observed

## 2023-09-11 NOTE — Progress Notes (Signed)
 Upon discharging, Nurse went over AVS educating on medications and follow up appointments. Suicide safety plan was completed. Reviewed with nurse and copy given to patient and placed in chart. Belongings sheet was signed and items returned to patient. Patient denies SI/HI (with no plan) and AVH. Voices no concerns to staff prior discharging off unit. Was safely walked out ride.

## 2023-09-11 NOTE — Plan of Care (Signed)
   Problem: Education: Goal: Emotional status will improve Outcome: Progressing Goal: Mental status will improve Outcome: Progressing Goal: Verbalization of understanding the information provided will improve Outcome: Progressing   Problem: Activity: Goal: Interest or engagement in activities will improve Outcome: Progressing

## 2023-09-11 NOTE — BHH Suicide Risk Assessment (Signed)
 Suicide Risk Assessment  Discharge Assessment    [facility] Discharge Suicide Risk Assessment   Principal Problem: MDD (major depressive disorder), recurrent severe, without psychosis (HCC) Discharge Diagnoses: Principal Problem:   MDD (major depressive disorder), recurrent severe, without psychosis (HCC)   Total Time spent with patient: 30 minutes  Musculoskeletal: Strength & Muscle Tone: within normal limits Gait & Station: normal Patient leans: N/A  Psychiatric Specialty Exam  Presentation  General Appearance:  Appropriate for Environment; Casual; Neat  Eye Contact: Good  Speech: Clear and Coherent; Normal Rate  Speech Volume: Normal  Handedness: Right   Mood and Affect  Mood: Euthymic  Duration of Depression Symptoms: Less than two weeks  Affect: Appropriate; Full Range   Thought Process  Thought Processes: Coherent; Goal Directed; Linear  Descriptions of Associations:Intact  Orientation:Full (Time, Place and Person)  Thought Content:Logical  History of Schizophrenia/Schizoaffective disorder:No  Duration of Psychotic Symptoms:No data recorded Hallucinations:Hallucinations: None  Ideas of Reference:None  Suicidal Thoughts:Suicidal Thoughts: No  Homicidal Thoughts:Homicidal Thoughts: No   Sensorium  Memory: Immediate Good; Recent Good; Remote Good  Judgment: Intact  Insight: Present   Executive Functions  Concentration: Good  Attention Span: Good  Recall: Good  Fund of Knowledge: Good  Language: Good   Psychomotor Activity  Psychomotor Activity: Psychomotor Activity: Normal   Assets  Assets: Communication Skills; Desire for Improvement; Financial Resources/Insurance; Housing; Physical Health; Resilience; Social Support; Talents/Skills   Sleep  Sleep: Sleep: Good  Estimated Sleeping Duration (Last 24 Hours): 6.00-7.75 hours  Physical Exam: Physical Exam ROS Blood pressure 138/87, pulse 77, temperature 98.3  F (36.8 C), temperature source Oral, resp. rate 16, height 5' 5 (1.651 m), weight 60.8 kg, SpO2 100%, not currently breastfeeding. Body mass index is 22.3 kg/m.  Mental Status Per Nursing Assessment::   On Admission:  NA  Demographic Factors:  Caucasian  Loss Factors: Loss of significant relationship  Historical Factors: Impulsivity  Risk Reduction Factors:   Responsible for children under 63 years of age, Sense of responsibility to family, Religious beliefs about death, Employed, Positive social support, Positive therapeutic relationship, and Positive coping skills or problem solving skills  Continued Clinical Symptoms:  More than one psychiatric diagnosis Previous Psychiatric Diagnoses and Treatments  Cognitive Features That Contribute To Risk:  None    Suicide Risk:  Minimal: No identifiable suicidal ideation.  Patients presenting with no risk factors but with morbid ruminations; may be classified as minimal risk based on the severity of the depressive symptoms.  She is stable, future-oriented, and demonstrates insight. Protective factors include her 3-month-old son and support from her mother. No acute safety concerns identified.    Follow-up Information     [facility] Go on 09/16/2023.   Specialty: Professional Counselor Why: Please go to this provider for an assessment, to obtain interim therapy services on 09/16/23 at 9:00 am.  You may also go Monday through Friday, from 9 am to 1 pm. Contact information: [address]  [address] [place] [address] [phone]         [facility] Go on 10/02/2023.   Why: You have an appointment for medication management services on 10/02/23 at 11:00 am. The appointment will be held in person, but you may call to switch to Virtual. Contact information: [address] [place] [address] [phone]         [facility]. Schedule an appointment as soon as possible for a  visit.   Specialty: Hospice and Palliative Medicine  Why: Please call this provider personally to schedule an appointment for grief/bereavement therapy services. Contact information: [address] [employer] [place]  [address] [phone]        [facility]. Go on 11/13/2023.   Specialty: Behavioral Health Why: You have an appointment for therapy services on 11/13/23 at 11:00 am with  [facility].  The appointment will be held in person. Contact information: [address] [place]  [phone]                Plan Of Care/Follow-up recommendations:  See discharge summary.  Blair Chiquita Hint, NP 09/11/2023, 7:20 AM

## 2023-11-13 ENCOUNTER — Ambulatory Visit (HOSPITAL_COMMUNITY): Admitting: Mental Health

## 2023-11-16 ENCOUNTER — Other Ambulatory Visit: Payer: Self-pay

## 2023-11-16 ENCOUNTER — Encounter (HOSPITAL_BASED_OUTPATIENT_CLINIC_OR_DEPARTMENT_OTHER): Payer: Self-pay | Admitting: Emergency Medicine

## 2023-11-16 ENCOUNTER — Emergency Department (HOSPITAL_BASED_OUTPATIENT_CLINIC_OR_DEPARTMENT_OTHER)
Admission: EM | Admit: 2023-11-16 | Discharge: 2023-11-16 | Disposition: A | Discharge status: Home / Self Care | Attending: Emergency Medicine | Admitting: Emergency Medicine

## 2023-11-16 DIAGNOSIS — L03011 Cellulitis of right finger: Secondary | ICD-10-CM | POA: Insufficient documentation

## 2023-11-16 DIAGNOSIS — M79644 Pain in right finger(s): Secondary | ICD-10-CM | POA: Diagnosis present

## 2023-11-16 MED ORDER — OXYCODONE HCL 5 MG PO TABS
10.0000 mg | ORAL_TABLET | Freq: Once | ORAL | Status: AC
  Administered 2023-11-16: 10 mg via ORAL
  Filled 2023-11-16: qty 2

## 2023-11-16 MED ORDER — CLINDAMYCIN HCL 300 MG PO CAPS: 300.0000 mg | ORAL_CAPSULE | Freq: Three times a day (TID) | ORAL | 0 refills | Status: AC

## 2023-11-16 MED ORDER — LIDOCAINE HCL (PF) 1 % IJ SOLN
10.0000 mL | Freq: Once | INTRAMUSCULAR | Status: AC
  Administered 2023-11-16: 10 mL
  Filled 2023-11-16: qty 10

## 2023-11-16 MED ORDER — CLINDAMYCIN HCL 150 MG PO CAPS
300.0000 mg | ORAL_CAPSULE | Freq: Once | ORAL | Status: AC
  Administered 2023-11-16: 300 mg via ORAL
  Filled 2023-11-16: qty 2

## 2023-11-16 MED ORDER — DIAZEPAM 5 MG PO TABS
10.0000 mg | ORAL_TABLET | Freq: Once | ORAL | Status: AC
  Administered 2023-11-16: 10 mg via ORAL
  Filled 2023-11-16: qty 2

## 2023-11-16 MED ORDER — OXYCODONE HCL 5 MG PO TABS: 5.0000 mg | ORAL_TABLET | Freq: Once | ORAL | Status: DC

## 2023-11-16 NOTE — ED Triage Notes (Signed)
  Patient comes in with abscess on R middle finger that has been going on for about 2 weeks.  Patient states she jammed finger into a wall cleaning and had a blister there.  They attempted to pop the blister to relieve pressure and made it worse.  Patient endorses 10/10, throbbing pain.  Took a muscle relaxer around 2200.

## 2023-11-16 NOTE — ED Provider Notes (Signed)
 [place] EMERGENCY DEPARTMENT AT [facility] Provider Note   CSN: [id number] Arrival date & time: 11/16/23  0242     History Chief Complaint  Patient presents with   Finger Injury    HPI Jordan Hanson is a 28 y.o. female presenting for severe finger pain. Had a blister on her hand that they reportedly popped at home.  Patient's recorded medical, surgical, social, medication list and allergies were reviewed in the Snapshot window as part of the initial history.   Review of Systems   Review of Systems  Constitutional:  Negative for chills and fever.  HENT:  Negative for ear pain and sore throat.   Eyes:  Negative for pain and visual disturbance.  Respiratory:  Negative for cough and shortness of breath.   Cardiovascular:  Negative for chest pain and palpitations.  Gastrointestinal:  Negative for abdominal pain and vomiting.  Genitourinary:  Negative for dysuria and hematuria.  Musculoskeletal:  Negative for arthralgias and back pain.  Skin:  Negative for color change and rash.  Neurological:  Negative for seizures and syncope.  All other systems reviewed and are negative.   Physical Exam Updated Vital Signs BP (!) 138/100   Pulse 85   Temp 98.3 F (36.8 C) (Oral)   Resp (!) 22   Ht 5' 5 (1.651 m)   Wt 61.2 kg   LMP 10/18/2023   SpO2 100%   BMI 22.47 kg/m  Physical Exam Vitals and nursing note reviewed.  Constitutional:      General: She is not in acute distress.    Appearance: She is well-developed.  HENT:     Head: Normocephalic and atraumatic.  Eyes:     Conjunctiva/sclera: Conjunctivae normal.  Cardiovascular:     Rate and Rhythm: Normal rate and regular rhythm.     Heart sounds: No murmur heard. Pulmonary:     Effort: Pulmonary effort is normal. No respiratory distress.     Breath sounds: Normal breath sounds.  Abdominal:     General: There is no distension.     Palpations: Abdomen is soft.     Tenderness: There is no abdominal  tenderness. There is no right CVA tenderness or left CVA tenderness.  Musculoskeletal:        General: Deformity and signs of injury present. No swelling or tenderness. Normal range of motion.     Cervical back: Neck supple.  Skin:    General: Skin is warm and dry.  Neurological:     General: No focal deficit present.     Mental Status: She is alert and oriented to person, place, and time. Mental status is at baseline.     Cranial Nerves: No cranial nerve deficit.      ED Course/ Medical Decision Making/ A&P    Procedures Procedures   Medications Ordered in ED Medications  diazepam (VALIUM) tablet 10 mg (10 mg Oral Given 11/16/23 0302)  oxyCODONE  (Oxy IR/ROXICODONE ) immediate release tablet 10 mg (10 mg Oral Given 11/16/23 0301)  lidocaine  (PF) (XYLOCAINE ) 1 % injection 10 mL (10 mLs Other Given by Other 11/16/23 0302)  clindamycin  (CLEOCIN ) capsule 300 mg (300 mg Oral Given 11/16/23 0421)    Medical Decision Making:   Stable 28 YOF with a paronychia. US  without evidence of Felon. Drained. Improving after treatment and stable.  Will treat with clindamycin  given other allergies recommend follow-up with PCP. Disposition:  I have considered need for hospitalization, however, considering all of the above, I believe this patient is  stable for discharge at this time.  Patient/family educated about specific return precautions for given chief complaint and symptoms.  Patient/family educated about follow-up with PCP.     Patient/family expressed understanding of return precautions and need for follow-up. Patient spoken to regarding all imaging and laboratory results and appropriate follow up for these results. All education provided in verbal form with additional information in written form. Time was allowed for answering of patient questions. Patient discharged.    Emergency Department Medication Summary:   Medications  diazepam (VALIUM) tablet 10 mg (10 mg Oral Given 11/16/23 0302)   oxyCODONE  (Oxy IR/ROXICODONE ) immediate release tablet 10 mg (10 mg Oral Given 11/16/23 0301)  lidocaine  (PF) (XYLOCAINE ) 1 % injection 10 mL (10 mLs Other Given by Other 11/16/23 0302)  clindamycin  (CLEOCIN ) capsule 300 mg (300 mg Oral Given 11/16/23 0421)        Clinical Impression:  1. Paronychia of finger, right      Discharge   Final Clinical Impression(s) / ED Diagnoses Final diagnoses:  Paronychia of finger, right    Rx / DC Orders ED Discharge Orders          Ordered    clindamycin  (CLEOCIN ) 300 MG capsule  3 times daily        11/16/23 0416              Jerral Meth, MD 11/16/23 [phone]

## 2023-12-22 DIAGNOSIS — M79644 Pain in right finger(s): Secondary | ICD-10-CM | POA: Diagnosis not present

## 2023-12-29 ENCOUNTER — Other Ambulatory Visit: Payer: Self-pay

## 2023-12-29 ENCOUNTER — Encounter (HOSPITAL_BASED_OUTPATIENT_CLINIC_OR_DEPARTMENT_OTHER): Payer: Self-pay | Admitting: Emergency Medicine

## 2023-12-29 ENCOUNTER — Emergency Department (HOSPITAL_BASED_OUTPATIENT_CLINIC_OR_DEPARTMENT_OTHER)

## 2023-12-29 ENCOUNTER — Emergency Department (HOSPITAL_BASED_OUTPATIENT_CLINIC_OR_DEPARTMENT_OTHER)
Admission: EM | Admit: 2023-12-29 | Discharge: 2023-12-29 | Disposition: A | Discharge status: Home / Self Care | Attending: Emergency Medicine | Admitting: Emergency Medicine

## 2023-12-29 DIAGNOSIS — G44309 Post-traumatic headache, unspecified, not intractable: Secondary | ICD-10-CM | POA: Diagnosis not present

## 2023-12-29 DIAGNOSIS — S022XXA Fracture of nasal bones, initial encounter for closed fracture: Secondary | ICD-10-CM | POA: Insufficient documentation

## 2023-12-29 DIAGNOSIS — Y92410 Unspecified street and highway as the place of occurrence of the external cause: Secondary | ICD-10-CM | POA: Insufficient documentation

## 2023-12-29 DIAGNOSIS — R519 Headache, unspecified: Secondary | ICD-10-CM | POA: Diagnosis not present

## 2023-12-29 DIAGNOSIS — S0993XA Unspecified injury of face, initial encounter: Secondary | ICD-10-CM | POA: Diagnosis present

## 2023-12-29 DIAGNOSIS — S060X0A Concussion without loss of consciousness, initial encounter: Secondary | ICD-10-CM | POA: Diagnosis not present

## 2023-12-29 DIAGNOSIS — S0291XA Unspecified fracture of skull, initial encounter for closed fracture: Secondary | ICD-10-CM | POA: Diagnosis not present

## 2023-12-29 MED ORDER — TRAMADOL HCL 50 MG PO TABS: 50.0000 mg | ORAL_TABLET | Freq: Four times a day (QID) | ORAL | 0 refills | Status: AC | PRN

## 2023-12-29 MED ORDER — ONDANSETRON 4 MG PO TBDP
4.0000 mg | ORAL_TABLET | Freq: Once | ORAL | Status: AC
  Administered 2023-12-29: 4 mg via ORAL
  Filled 2023-12-29: qty 1

## 2023-12-29 MED ORDER — TRAMADOL HCL 50 MG PO TABS
50.0000 mg | ORAL_TABLET | Freq: Once | ORAL | Status: AC
  Administered 2023-12-29: 50 mg via ORAL
  Filled 2023-12-29: qty 1

## 2023-12-29 MED ORDER — ONDANSETRON 4 MG PO TBDP: 4.0000 mg | ORAL_TABLET | Freq: Three times a day (TID) | ORAL | 0 refills | Status: AC | PRN
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
## 2023-12-29 NOTE — Discharge Instructions (Signed)
 As we discussed, your CT's show only a small fracture of a nasal bone. You can take zofran  for nausea and Tramadol  for headache pain safely at home. Return to the ED with any severe headache, passing out, uncontrolled vomiting or for new concern.
